# Patient Record
Sex: Female | Born: 1935 | Race: White | Hispanic: No | Marital: Married | Smoking: Former smoker
Health system: Southern US, Community
[De-identification: ages and names within clinical notes are randomized; demographics above are authoritative.]

## PROBLEM LIST (undated history)

## (undated) ENCOUNTER — Emergency Department (HOSPITAL_COMMUNITY): Discharge status: Absent without leave | Payer: Medicare Other

## (undated) DIAGNOSIS — C801 Malignant (primary) neoplasm, unspecified: Secondary | ICD-10-CM

## (undated) DIAGNOSIS — T4145XA Adverse effect of unspecified anesthetic, initial encounter: Secondary | ICD-10-CM

## (undated) DIAGNOSIS — T8859XA Other complications of anesthesia, initial encounter: Secondary | ICD-10-CM

## (undated) DIAGNOSIS — I10 Essential (primary) hypertension: Secondary | ICD-10-CM

## (undated) DIAGNOSIS — U071 COVID-19: Secondary | ICD-10-CM

## (undated) DIAGNOSIS — E079 Disorder of thyroid, unspecified: Secondary | ICD-10-CM

## (undated) DIAGNOSIS — E785 Hyperlipidemia, unspecified: Secondary | ICD-10-CM

## (undated) HISTORY — PX: KNEE SURGERY: SHX244

## (undated) HISTORY — PX: ABDOMINAL HYSTERECTOMY: SHX81

---

## 1997-10-05 ENCOUNTER — Emergency Department (HOSPITAL_COMMUNITY): Admission: EM | Admit: 1997-10-05 | Discharge: 1997-10-05 | Payer: Self-pay | Admitting: Emergency Medicine

## 1997-10-09 ENCOUNTER — Observation Stay (HOSPITAL_COMMUNITY): Admission: RE | Admit: 1997-10-09 | Discharge: 1997-10-10 | Payer: Self-pay | Admitting: Specialist

## 1998-09-07 ENCOUNTER — Ambulatory Visit (HOSPITAL_COMMUNITY): Admission: RE | Admit: 1998-09-07 | Discharge: 1998-09-07 | Payer: Self-pay | Admitting: *Deleted

## 2000-03-25 ENCOUNTER — Emergency Department (HOSPITAL_COMMUNITY): Admission: EM | Admit: 2000-03-25 | Discharge: 2000-03-25 | Payer: Self-pay | Admitting: *Deleted

## 2000-03-29 ENCOUNTER — Inpatient Hospital Stay (HOSPITAL_COMMUNITY): Admission: RE | Admit: 2000-03-29 | Discharge: 2000-03-30 | Payer: Self-pay | Admitting: Orthopedic Surgery

## 2000-03-29 ENCOUNTER — Encounter: Payer: Self-pay | Admitting: Orthopedic Surgery

## 2000-10-23 ENCOUNTER — Encounter: Admission: RE | Admit: 2000-10-23 | Discharge: 2000-10-23 | Payer: Self-pay | Admitting: Family Medicine

## 2001-05-21 ENCOUNTER — Encounter: Admission: RE | Admit: 2001-05-21 | Discharge: 2001-05-21 | Payer: Self-pay | Admitting: Family Medicine

## 2001-05-21 ENCOUNTER — Encounter: Payer: Self-pay | Admitting: Family Medicine

## 2001-11-20 ENCOUNTER — Emergency Department (HOSPITAL_COMMUNITY): Admission: EM | Admit: 2001-11-20 | Discharge: 2001-11-20 | Payer: Self-pay | Admitting: Emergency Medicine

## 2001-11-20 ENCOUNTER — Encounter: Payer: Self-pay | Admitting: Emergency Medicine

## 2002-05-09 ENCOUNTER — Encounter: Admission: RE | Admit: 2002-05-09 | Discharge: 2002-05-09 | Payer: Self-pay | Admitting: Family Medicine

## 2002-05-09 ENCOUNTER — Encounter: Payer: Self-pay | Admitting: Family Medicine

## 2002-10-07 ENCOUNTER — Inpatient Hospital Stay (HOSPITAL_COMMUNITY): Admission: RE | Admit: 2002-10-07 | Discharge: 2002-10-08 | Payer: Self-pay | Admitting: Obstetrics and Gynecology

## 2003-07-20 ENCOUNTER — Encounter: Admission: RE | Admit: 2003-07-20 | Discharge: 2003-07-20 | Payer: Self-pay | Admitting: Family Medicine

## 2004-10-12 ENCOUNTER — Ambulatory Visit (HOSPITAL_COMMUNITY): Admission: RE | Admit: 2004-10-12 | Discharge: 2004-10-12 | Payer: Self-pay | Admitting: *Deleted

## 2013-03-26 ENCOUNTER — Observation Stay (HOSPITAL_COMMUNITY): Payer: Medicare Other

## 2013-03-26 ENCOUNTER — Emergency Department (HOSPITAL_COMMUNITY): Payer: Medicare Other

## 2013-03-26 ENCOUNTER — Encounter (HOSPITAL_COMMUNITY): Payer: Self-pay | Admitting: Emergency Medicine

## 2013-03-26 ENCOUNTER — Observation Stay (HOSPITAL_COMMUNITY)
Admission: EM | Admit: 2013-03-26 | Discharge: 2013-03-28 | Disposition: A | Discharge status: Home / Self Care | Payer: Medicare Other | Attending: General Surgery | Admitting: General Surgery

## 2013-03-26 DIAGNOSIS — I700 Atherosclerosis of aorta: Secondary | ICD-10-CM | POA: Insufficient documentation

## 2013-03-26 DIAGNOSIS — E785 Hyperlipidemia, unspecified: Secondary | ICD-10-CM | POA: Insufficient documentation

## 2013-03-26 DIAGNOSIS — Z7982 Long term (current) use of aspirin: Secondary | ICD-10-CM | POA: Insufficient documentation

## 2013-03-26 DIAGNOSIS — R112 Nausea with vomiting, unspecified: Secondary | ICD-10-CM

## 2013-03-26 DIAGNOSIS — F29 Unspecified psychosis not due to a substance or known physiological condition: Secondary | ICD-10-CM | POA: Insufficient documentation

## 2013-03-26 DIAGNOSIS — I10 Essential (primary) hypertension: Secondary | ICD-10-CM | POA: Insufficient documentation

## 2013-03-26 DIAGNOSIS — K8000 Calculus of gallbladder with acute cholecystitis without obstruction: Principal | ICD-10-CM | POA: Insufficient documentation

## 2013-03-26 DIAGNOSIS — Z8589 Personal history of malignant neoplasm of other organs and systems: Secondary | ICD-10-CM | POA: Insufficient documentation

## 2013-03-26 DIAGNOSIS — R109 Unspecified abdominal pain: Secondary | ICD-10-CM

## 2013-03-26 DIAGNOSIS — K5730 Diverticulosis of large intestine without perforation or abscess without bleeding: Secondary | ICD-10-CM | POA: Insufficient documentation

## 2013-03-26 DIAGNOSIS — Q619 Cystic kidney disease, unspecified: Secondary | ICD-10-CM | POA: Insufficient documentation

## 2013-03-26 DIAGNOSIS — Z79899 Other long term (current) drug therapy: Secondary | ICD-10-CM | POA: Insufficient documentation

## 2013-03-26 DIAGNOSIS — K219 Gastro-esophageal reflux disease without esophagitis: Secondary | ICD-10-CM | POA: Insufficient documentation

## 2013-03-26 DIAGNOSIS — Z87891 Personal history of nicotine dependence: Secondary | ICD-10-CM | POA: Insufficient documentation

## 2013-03-26 DIAGNOSIS — Z9049 Acquired absence of other specified parts of digestive tract: Secondary | ICD-10-CM

## 2013-03-26 DIAGNOSIS — I498 Other specified cardiac arrhythmias: Secondary | ICD-10-CM | POA: Insufficient documentation

## 2013-03-26 DIAGNOSIS — K8020 Calculus of gallbladder without cholecystitis without obstruction: Secondary | ICD-10-CM

## 2013-03-26 DIAGNOSIS — Z90710 Acquired absence of both cervix and uterus: Secondary | ICD-10-CM | POA: Insufficient documentation

## 2013-03-26 DIAGNOSIS — E039 Hypothyroidism, unspecified: Secondary | ICD-10-CM | POA: Insufficient documentation

## 2013-03-26 HISTORY — DX: Other complications of anesthesia, initial encounter: T88.59XA

## 2013-03-26 HISTORY — DX: Adverse effect of unspecified anesthetic, initial encounter: T41.45XA

## 2013-03-26 HISTORY — DX: Hyperlipidemia, unspecified: E78.5

## 2013-03-26 HISTORY — DX: Malignant (primary) neoplasm, unspecified: C80.1

## 2013-03-26 HISTORY — DX: Essential (primary) hypertension: I10

## 2013-03-26 HISTORY — DX: Disorder of thyroid, unspecified: E07.9

## 2013-03-26 LAB — COMPREHENSIVE METABOLIC PANEL
ALT: 20 U/L (ref 0–35)
AST: 23 U/L (ref 0–37)
Albumin: 3.5 g/dL (ref 3.5–5.2)
Alkaline Phosphatase: 82 U/L (ref 39–117)
BUN: 11 mg/dL (ref 6–23)
CO2: 25 mEq/L (ref 19–32)
Calcium: 8.8 mg/dL (ref 8.4–10.5)
Chloride: 99 mEq/L (ref 96–112)
Creatinine, Ser: 0.79 mg/dL (ref 0.50–1.10)
GFR calc Af Amer: >90 mL/min (ref >90)
GFR calc non Af Amer: 78 mL/min — ABNORMAL LOW (ref >90)
Glucose, Bld: 148 mg/dL — ABNORMAL HIGH (ref 70–99)
Potassium: 3.8 mEq/L (ref 3.7–5.3)
Sodium: 137 mEq/L (ref 137–147)
Total Bilirubin: 0.6 mg/dL (ref 0.3–1.2)
Total Protein: 8.3 g/dL (ref 6.0–8.3)

## 2013-03-26 LAB — CBC WITH DIFFERENTIAL/PLATELET
Basophils Absolute: 0 10*3/uL (ref 0.0–0.1)
Basophils Relative: 0 % (ref 0–1)
Eosinophils Absolute: 0 10*3/uL (ref 0.0–0.7)
Eosinophils Relative: 0 % (ref 0–5)
HCT: 42 % (ref 36.0–46.0)
Hemoglobin: 14.6 g/dL (ref 12.0–15.0)
Lymphocytes Relative: 4 % — ABNORMAL LOW (ref 12–46)
Lymphs Abs: 0.4 10*3/uL — ABNORMAL LOW (ref 0.7–4.0)
MCH: 31 pg (ref 26.0–34.0)
MCHC: 34.8 g/dL (ref 30.0–36.0)
MCV: 89.2 fL (ref 78.0–100.0)
Monocytes Absolute: 1.2 10*3/uL — ABNORMAL HIGH (ref 0.1–1.0)
Monocytes Relative: 12 % (ref 3–12)
Neutro Abs: 8.3 10*3/uL — ABNORMAL HIGH (ref 1.7–7.7)
Neutrophils Relative %: 84 % — ABNORMAL HIGH (ref 43–77)
Platelets: 145 10*3/uL — ABNORMAL LOW (ref 150–400)
RBC: 4.71 MIL/uL (ref 3.87–5.11)
RDW: 12.7 % (ref 11.5–15.5)
WBC: 9.8 10*3/uL (ref 4.0–10.5)

## 2013-03-26 LAB — LIPASE, BLOOD: Lipase: 32 U/L (ref 11–59)

## 2013-03-26 LAB — URINALYSIS, ROUTINE W REFLEX MICROSCOPIC
Bilirubin Urine: NEGATIVE
Glucose, UA: NEGATIVE mg/dL
Ketones, ur: NEGATIVE mg/dL
Leukocytes, UA: NEGATIVE
Nitrite: NEGATIVE
Protein, ur: 30 mg/dL — AB
Specific Gravity, Urine: 1.041 — ABNORMAL HIGH (ref 1.005–1.030)
Urobilinogen, UA: 1 mg/dL (ref 0.0–1.0)
pH: 6.5 (ref 5.0–8.0)

## 2013-03-26 LAB — APTT: APTT: 33 s (ref 24–37)

## 2013-03-26 LAB — URINE MICROSCOPIC-ADD ON

## 2013-03-26 MED ORDER — DIPHENHYDRAMINE HCL 12.5 MG/5ML PO ELIX: 12.5000 mg | ORAL_SOLUTION | Freq: Four times a day (QID) | ORAL | Status: DC | PRN

## 2013-03-26 MED ORDER — ATENOLOL 12.5 MG HALF TABLET
12.5000 mg | ORAL_TABLET | Freq: Every day | ORAL | Status: DC
  Administered 2013-03-28: 12.5 mg via ORAL
  Filled 2013-03-26 (×2): qty 1

## 2013-03-26 MED ORDER — HYDROMORPHONE HCL PF 1 MG/ML IJ SOLN
0.5000 mg | INTRAMUSCULAR | Status: DC | PRN
  Administered 2013-03-26: 0.5 mg via INTRAVENOUS
  Filled 2013-03-26: qty 1

## 2013-03-26 MED ORDER — ONDANSETRON HCL 4 MG/2ML IJ SOLN: 4.0000 mg | Freq: Four times a day (QID) | INTRAMUSCULAR | Status: DC | PRN

## 2013-03-26 MED ORDER — SODIUM CHLORIDE 0.9 % IV SOLN
3.0000 g | Freq: Four times a day (QID) | INTRAVENOUS | Status: DC
  Administered 2013-03-26 – 2013-03-28 (×6): 3 g via INTRAVENOUS
  Filled 2013-03-26 (×9): qty 3

## 2013-03-26 MED ORDER — LEVOTHYROXINE SODIUM 75 MCG PO TABS
75.0000 ug | ORAL_TABLET | Freq: Every day | ORAL | Status: DC
  Administered 2013-03-27 – 2013-03-28 (×2): 75 ug via ORAL
  Filled 2013-03-26 (×3): qty 1

## 2013-03-26 MED ORDER — DIPHENHYDRAMINE HCL 50 MG/ML IJ SOLN: 12.5000 mg | Freq: Four times a day (QID) | INTRAMUSCULAR | Status: DC | PRN

## 2013-03-26 MED ORDER — ACETAMINOPHEN 650 MG RE SUPP: 650.0000 mg | Freq: Four times a day (QID) | RECTAL | Status: DC | PRN

## 2013-03-26 MED ORDER — HEPARIN SODIUM (PORCINE) 5000 UNIT/ML IJ SOLN
5000.0000 [IU] | Freq: Once | INTRAMUSCULAR | Status: AC
  Administered 2013-03-26: 5000 [IU] via SUBCUTANEOUS
  Filled 2013-03-26: qty 1

## 2013-03-26 MED ORDER — IOHEXOL 300 MG/ML  SOLN
100.0000 mL | Freq: Once | INTRAMUSCULAR | Status: AC | PRN
  Administered 2013-03-26: 100 mL via INTRAVENOUS

## 2013-03-26 MED ORDER — ACETAMINOPHEN 325 MG PO TABS
650.0000 mg | ORAL_TABLET | Freq: Four times a day (QID) | ORAL | Status: DC | PRN
  Administered 2013-03-27: 650 mg via ORAL
  Filled 2013-03-26: qty 2

## 2013-03-26 MED ORDER — OXYCODONE HCL 5 MG PO TABS
5.0000 mg | ORAL_TABLET | ORAL | Status: DC | PRN
  Administered 2013-03-26 – 2013-03-27 (×2): 10 mg via ORAL
  Filled 2013-03-26 (×2): qty 2

## 2013-03-26 MED ORDER — ONDANSETRON HCL 4 MG/2ML IJ SOLN
4.0000 mg | Freq: Once | INTRAMUSCULAR | Status: AC
  Administered 2013-03-26: 4 mg via INTRAVENOUS
  Filled 2013-03-26: qty 2

## 2013-03-26 MED ORDER — MORPHINE SULFATE 4 MG/ML IJ SOLN
4.0000 mg | Freq: Once | INTRAMUSCULAR | Status: AC
  Administered 2013-03-26: 4 mg via INTRAVENOUS
  Filled 2013-03-26: qty 1

## 2013-03-26 MED ORDER — POTASSIUM CHLORIDE IN NACL 20-0.9 MEQ/L-% IV SOLN
INTRAVENOUS | Status: DC
  Administered 2013-03-26: 75 mL via INTRAVENOUS
  Filled 2013-03-26 (×2): qty 1000

## 2013-03-26 MED ORDER — IOHEXOL 300 MG/ML  SOLN
50.0000 mL | Freq: Once | INTRAMUSCULAR | Status: AC | PRN
  Administered 2013-03-26: 50 mL via ORAL

## 2013-03-26 MED ORDER — SODIUM CHLORIDE 0.9 % IV BOLUS (SEPSIS)
1000.0000 mL | Freq: Once | INTRAVENOUS | Status: AC
  Administered 2013-03-26: 1000 mL via INTRAVENOUS

## 2013-03-26 NOTE — ED Notes (Signed)
Dilaudid 0.5mg  IVP given at transfer

## 2013-03-26 NOTE — ED Notes (Signed)
Patient transported to CT 

## 2013-03-26 NOTE — H&P (Signed)
Amber [name] is an 80 y.o. female.   [name], MD PCP Chief Complaint: Abdominal pain and nausea 2 nights in a row. HPI: 80 y/o female in fairly good health had abdominal pain Monday PM, 1st episode, nothing like this before, different from GERD, in Mid abdominal region. Pain got better Monday PM, 03/24/13.  She did well went to a [employer] supper at church 2/10, and pain reoccurred that afternoon from 4PM till she got treated here in the ER 03/26/13.  She is currently pain free, she has had GERD, but this is clearly different. Pain is again mid epigastric area.  Some nausea, but no vomiting, some dry heaves.   IN ER CT scan shows: There are multiple calcified and noncalcified gallstones.  Gallbladder wall appears mildly thickened with mild surrounding inflammatory change. Gallbladder is distended to about 10 cm. CBC, Lipase, and CMP are normal.  CT also shows a renal cyst and calcified aorta. She is currently pain free.  She has not eaten today.  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 She has fallen multiple times with this and has had surgery after falls.    Past Surgical History  Procedure Laterality Date   Multiple surgeries Left leg, including large resection of sarcoma.    . Abdominal hysterectomy      No family history on file. Social History:  reports that she has quit smoking. She does not have any smokeless tobacco history on file. She reports that she does not drink alcohol or use illicit drugs. Tobacco:  2PPD 34 years  Quit age 51 Drugs:  None ETOH:  None Retired from [employer] Allergies: No Known Allergies  Prior to Admission medications   Medication Sig Start Date End Date Taking? Authorizing Provider   aspirin 81 MG tablet Take 81 mg by mouth daily.   Yes Historical Provider, MD  atenolol (TENORMIN) 25 MG tablet Take 12.5 mg by mouth daily.   Yes Historical Provider, MD  levothyroxine (SYNTHROID, LEVOTHROID) 75 MCG tablet Take 75 mcg by mouth daily before breakfast.   Yes Historical Provider, MD  Multiple Vitamins-Calcium (VIACTIV MULTI-VITAMIN) CHEW Chew 1 tablet by mouth every evening.   Yes Historical Provider, MD  pravastatin (PRAVACHOL) 20 MG tablet Take 20 mg by mouth at bedtime.   Yes Historical Provider, MD     Results for orders placed during the hospital encounter of 03/26/13 (from the past 48 hour(s))  COMPREHENSIVE METABOLIC PANEL     Status: Abnormal   Collection Time    03/26/13  1:45 PM      Result Value Ref Range   Sodium 137  137 - 147 mEq/L   Potassium 3.8  3.7 - 5.3 mEq/L   Chloride 99  96 - 112 mEq/L   CO2 25  19 - 32 mEq/L   Glucose, Bld 148 (*) 70 - 99 mg/dL   BUN 11  6 - 23 mg/dL   Creatinine, Ser 0.79  0.50 - 1.10 mg/dL   Calcium 8.8  8.4 - 10.5 mg/dL   Total Protein 8.3  6.0 - 8.3 g/dL   Albumin 3.5  3.5 - 5.2 g/dL   AST 23  0 - 37 U/L  ALT 20  0 - 35 U/L   Alkaline Phosphatase 82  39 - 117 U/L   Total Bilirubin 0.6  0.3 - 1.2 mg/dL   GFR calc non Af Amer 78 (*) >90 mL/min   GFR calc Af Amer >90  >90 mL/min   Comment: (NOTE)     The eGFR has been calculated using the CKD EPI equation.     This calculation has not been validated in all clinical situations.     eGFR's persistently <90 mL/min signify possible Chronic Kidney     Disease.  LIPASE, BLOOD     Status: None   Collection Time    03/26/13  1:45 PM      Result Value Ref Range   Lipase 32  11 - 59 U/L  CBC WITH DIFFERENTIAL     Status: Abnormal   Collection Time    03/26/13  1:45 PM      Result Value Ref Range   WBC 9.8  4.0 - 10.5 K/uL   RBC 4.71  3.87 - 5.11 MIL/uL   Hemoglobin 14.6  12.0 - 15.0 g/dL   HCT 42.0  36.0 - 46.0 %   MCV 89.2  78.0 - 100.0 fL   MCH 31.0  26.0 - 34.0 pg    MCHC 34.8  30.0 - 36.0 g/dL   RDW 12.7  11.5 - 15.5 %   Platelets 145 (*) 150 - 400 K/uL   Neutrophils Relative % 84 (*) 43 - 77 %   Neutro Abs 8.3 (*) 1.7 - 7.7 K/uL   Lymphocytes Relative 4 (*) 12 - 46 %   Lymphs Abs 0.4 (*) 0.7 - 4.0 K/uL   Monocytes Relative 12  3 - 12 %   Monocytes Absolute 1.2 (*) 0.1 - 1.0 K/uL   Eosinophils Relative 0  0 - 5 %   Eosinophils Absolute 0.0  0.0 - 0.7 K/uL   Basophils Relative 0  0 - 1 %   Basophils Absolute 0.0  0.0 - 0.1 K/uL  URINALYSIS, ROUTINE W REFLEX MICROSCOPIC     Status: Abnormal   Collection Time    03/26/13  3:37 PM      Result Value Ref Range   Color, Urine YELLOW  YELLOW   APPearance CLEAR  CLEAR   Specific Gravity, Urine 1.041 (*) 1.005 - 1.030   pH 6.5  5.0 - 8.0   Glucose, UA NEGATIVE  NEGATIVE mg/dL   Hgb urine dipstick TRACE (*) NEGATIVE   Bilirubin Urine NEGATIVE  NEGATIVE   Ketones, ur NEGATIVE  NEGATIVE mg/dL   Protein, ur 30 (*) NEGATIVE mg/dL   Urobilinogen, UA 1.0  0.0 - 1.0 mg/dL   Nitrite NEGATIVE  NEGATIVE   Leukocytes, UA NEGATIVE  NEGATIVE  URINE MICROSCOPIC-ADD ON     Status: None   Collection Time    03/26/13  3:37 PM      Result Value Ref Range   Squamous Epithelial / LPF RARE  RARE   WBC, UA 0-2  <3 WBC/hpf   RBC / HPF 0-2  <3 RBC/hpf   Ct Abdomen Pelvis W Contrast  03/26/2013   CLINICAL DATA:  Upper abdominal pain for 2 days with nausea and vomiting, history of left leg sarcoma  EXAM: CT ABDOMEN AND PELVIS WITH CONTRAST  TECHNIQUE: Multidetector CT imaging of the abdomen and pelvis was performed using the standard protocol following bolus administration of intravenous contrast.  CONTRAST:  149m OMNIPAQUE IOHEXOL 300 MG/ML  SOLN  COMPARISON:  None.  FINDINGS: Visualized lung bases clear. Bone windows negative for acute findings.  Liver, spleen, pancreas, and adrenal glands are normal. Right kidney is normal.  Aorta is calcified. There is diverticulosis of the sigmoid and descending colon. Bowel is otherwise  normal. Appendix is normal.  Bladder is normal. Reproductive organs appear to be absent. No ascites or significant adenopathy.  There are multiple calcified and noncalcified gallstones. Gallbladder wall appears mildly thickened with mild surrounding inflammatory change. Gallbladder is distended to about 10 cm.  In the midpole the left kidney, there is a low-attenuation circumscribed lesion measuring 14 x 10 mm. It demonstrates an average attenuation value of 49. There appears to be a mildly hyper attenuating nodule within this lesion.  IMPRESSION: 1. Findings are concerning for cholecystitis associated with cholelithiasis. 2. Indeterminate 14 mm left renal lesion. Complex cyst versus malignancy both possible. Recommend nonemergent renal protocol MRI.   Electronically Signed   By: Skipper Cliche M.D.   On: 03/26/2013 15:00    Review of Systems  Constitutional: Negative for fever, chills, weight loss, malaise/fatigue and diaphoresis.  HENT: Negative.   Eyes: Negative.   Respiratory: Negative.   Cardiovascular: Negative.   Gastrointestinal: Positive for heartburn (for years), nausea, vomiting (more like dry heaves, yellow spit, no real vomiting.) and constipation. Negative for diarrhea, blood in stool and melena.  Genitourinary: Negative.   Musculoskeletal:       Left leg pain after extensive resection of sarcoma.  Skin: Negative.   Neurological: Negative.  Negative for weakness.  Endo/Heme/Allergies: Bruises/bleeds easily.  Psychiatric/Behavioral: Negative.     Blood pressure 198/88, pulse 79, temperature 98.3 F (36.8 C), resp. rate 16, height 5' 8.75" (1.746 m), weight 69.854 kg (154 lb), SpO2 93.00%. Physical Exam  Constitutional: She is oriented to person, place, and time. She appears well-developed and well-nourished. No distress.  HENT:  Head: Normocephalic and atraumatic.  Nose: Nose normal.  Eyes: Conjunctivae and EOM are normal. Pupils are equal, round, and reactive to light. Right  eye exhibits no discharge. Left eye exhibits no discharge. No scleral icterus.  Neck: Normal range of motion. Neck supple. No JVD present. No tracheal deviation present. No thyromegaly present.  Cardiovascular: Normal rate, regular rhythm, normal heart sounds and intact distal pulses.  Exam reveals no gallop.   No murmur heard. Respiratory: Effort normal and breath sounds normal. No respiratory distress. She has no wheezes. She has no rales. She exhibits no tenderness.  GI: Soft. Bowel sounds are normal. She exhibits no distension and no mass. There is no tenderness. There is no rebound and no guarding.  Large midline surgical scar, s/p hysterectomy  Musculoskeletal: She exhibits no edema and no tenderness.  Lymphadenopathy:    She has no cervical adenopathy.  Neurological: She is alert and oriented to person, place, and time. No cranial nerve deficit.  Skin: Skin is warm and dry. No rash noted. She is not diaphoretic. No erythema. No pallor.  Psychiatric: She has a normal mood and affect. Her behavior is normal. Judgment and thought content normal.     Assessment/Plan 1.  Cholecystitis/cholelithiasis 2.  Hx of left leg sarcoma with resection 3.  Hypertension 4.  Hyperlipidemia 5.  Hypothyroid, on supplement. 6.  GERD 7.  Former smoker (60 pack year hx) 8.  Left renal cyst found on CT scan  Plan:  Admit, IV antibiotics, clear liquids till MN, hydrate and surgery tomorrow.  We will defer renal cyst to PCP.  Norinne Jeane 03/26/2013, 5:17 PM

## 2013-03-26 NOTE — ED Notes (Signed)
MEDS TAKEN TO ROOM

## 2013-03-26 NOTE — ED Notes (Signed)
Pt states can not void at this time

## 2013-03-26 NOTE — ED Notes (Signed)
inpt RN aware that fluids/abx not yet received from pharmacy.

## 2013-03-26 NOTE — ED Notes (Signed)
Pt presents with NAD- Per [facility]- pt c/o of stomach pain x 2 days- vomiting bile like emesis. zofran 4mg  given IVP in route. Denies fever and diarrhea.

## 2013-03-26 NOTE — ED Provider Notes (Signed)
CSN: [id number]     Arrival date & time 03/26/13  1138 History   First MD Initiated Contact with Patient 03/26/13 1156     Chief Complaint  Patient presents with  . Abdominal Pain  . Nausea     (Consider location/radiation/quality/duration/timing/severity/associated sxs/prior Treatment) HPI  80 year old female with abdominal pain. Epigastric/right upper quadrant. Intermittent over the past 2 days. Last night becoming much more severe and did not subside. Associated with nausea and vomiting. No fevers or chills. No urinary complaints. No diarrhea. No past history of similar symptoms.  Surgical history is significant for hysterectomy. No recent procedures.  Past Medical History  Diagnosis Date  . Hypertension   . Hyperlipidemia   . Thyroid disease   . sarcoma of lt leg dx'd 1989    xrt and chemo and surg   Past Surgical History  Procedure Laterality Date  . Abdominal hysterectomy     No family history on file. History  Substance Use Topics  . Smoking status: Former Research scientist (life sciences)  . Smokeless tobacco: Not on file  . Alcohol Use: No   OB History   Grav Para Term Preterm Abortions TAB SAB Ect Mult Living                 Review of Systems  All systems reviewed and negative, other than as noted in HPI.   Allergies  Review of patient's allergies indicates no known allergies.  Home Medications   Current Outpatient Rx  Name  Route  Sig  Dispense  Refill  . aspirin 81 MG tablet   Oral   Take 81 mg by mouth daily.         Marland Kitchen atenolol (TENORMIN) 25 MG tablet   Oral   Take 12.5 mg by mouth daily.         Marland Kitchen levothyroxine (SYNTHROID, LEVOTHROID) 75 MCG tablet   Oral   Take 75 mcg by mouth daily before breakfast.         . Multiple Vitamins-Calcium (VIACTIV MULTI-VITAMIN) CHEW   Oral   Chew 1 tablet by mouth every evening.         . pravastatin (PRAVACHOL) 20 MG tablet   Oral   Take 20 mg by mouth at bedtime.          BP 196/74  Pulse 65  Temp(Src) 98.3 F  (36.8 C)  Resp 16  Ht 5' 8.75" (1.746 m)  Wt 154 lb (69.854 kg)  BMI 22.91 kg/m2  SpO2 95% Physical Exam  Nursing note and vitals reviewed. Constitutional: She appears well-developed and well-nourished. No distress.  HENT:  Head: Normocephalic and atraumatic.  Eyes: Conjunctivae are normal. Right eye exhibits no discharge. Left eye exhibits no discharge.  Neck: Neck supple.  Cardiovascular: Normal rate, regular rhythm and normal heart sounds.  Exam reveals no gallop and no friction rub.   No murmur heard. Pulmonary/Chest: Effort normal and breath sounds normal. No respiratory distress.  Abdominal: Soft. She exhibits no distension. There is tenderness. There is no rebound and no guarding.  Tenderness in epigastrium and RUQ. No rebound or guarding. No distension.    Musculoskeletal: She exhibits no edema and no tenderness.  Neurological: She is alert.  Skin: Skin is warm and dry.  Psychiatric: She has a normal mood and affect. Her behavior is normal. Thought content normal.    ED Course  Procedures (including critical care time) Labs Review Labs Reviewed  COMPREHENSIVE METABOLIC PANEL - Abnormal; Notable for the following:  Glucose, Bld 148 (*)    GFR calc non Af Amer 78 (*)    All other components within normal limits  CBC WITH DIFFERENTIAL - Abnormal; Notable for the following:    Platelets 145 (*)    Neutrophils Relative % 84 (*)    Neutro Abs 8.3 (*)    Lymphocytes Relative 4 (*)    Lymphs Abs 0.4 (*)    Monocytes Absolute 1.2 (*)    All other components within normal limits  LIPASE, BLOOD  URINALYSIS, ROUTINE W REFLEX MICROSCOPIC   Imaging Review Ct Abdomen Pelvis W Contrast  03/26/2013   CLINICAL DATA:  Upper abdominal pain for 2 days with nausea and vomiting, history of left leg sarcoma  EXAM: CT ABDOMEN AND PELVIS WITH CONTRAST  TECHNIQUE: Multidetector CT imaging of the abdomen and pelvis was performed using the standard protocol following bolus administration  of intravenous contrast.  CONTRAST:  160mL OMNIPAQUE IOHEXOL 300 MG/ML  SOLN  COMPARISON:  None.  FINDINGS: Visualized lung bases clear. Bone windows negative for acute findings.  Liver, spleen, pancreas, and adrenal glands are normal. Right kidney is normal.  Aorta is calcified. There is diverticulosis of the sigmoid and descending colon. Bowel is otherwise normal. Appendix is normal.  Bladder is normal. Reproductive organs appear to be absent. No ascites or significant adenopathy.  There are multiple calcified and noncalcified gallstones. Gallbladder wall appears mildly thickened with mild surrounding inflammatory change. Gallbladder is distended to about 10 cm.  In the midpole the left kidney, there is a low-attenuation circumscribed lesion measuring 14 x 10 mm. It demonstrates an average attenuation value of 49. There appears to be a mildly hyper attenuating nodule within this lesion.  IMPRESSION: 1. Findings are concerning for cholecystitis associated with cholelithiasis. 2. Indeterminate 14 mm left renal lesion. Complex cyst versus malignancy both possible. Recommend nonemergent renal protocol MRI.   Electronically Signed   By: Skipper Cliche M.D.   On: 03/26/2013 15:00    EKG Interpretation   None       MDM   Final diagnoses:  Abdominal pain  Nausea and vomiting  Cholelithiasis    80yF with intermittent epigastric pain and n/v. CT significant for cholelithiasis. Additional findings concerning for possible cholecystitis. Pt reports complete resolution of symptoms after a single dose of pain meds. Afebrile. No leukocytosis. Normal LFTs. I can no longer illicit any tenderness on exam. I would expect an acutely inflamed GB to remain tender to palpation even with pain medication. CT findings and age concerning though and surgery consulted.     Virgel Manifold, MD 03/26/13 1700

## 2013-03-26 NOTE — ED Notes (Signed)
Pt ret from CT at this time, family remains at bs, pt continues to deny needs/complaints at this time.  NAD.

## 2013-03-26 NOTE — ED Notes (Signed)
Bed: PQ33 Expected date:  Expected time:  Means of arrival:  Comments: ABD PAIN

## 2013-03-26 NOTE — ED Notes (Signed)
Initial contact - pt resting on stretcher with family at bs.  Pt reports upper abd pain x2 days, +nausea, reports vomiting "bile".  No active vomiting at this time.  Pt reports hx constipation, denies other abd hx.  Abd s/nt/nd.  +bsx4 quads.  Pt denies CP/SOB or other complaints.  Skin PWD, MAEI.  Speaking full/clear sentences, rr even/un-lab.  NAD.

## 2013-03-27 ENCOUNTER — Encounter (HOSPITAL_COMMUNITY)
Admission: EM | Disposition: A | Discharge status: Home / Self Care | Payer: Self-pay | Source: Home / Self Care | Attending: Emergency Medicine

## 2013-03-27 ENCOUNTER — Encounter (HOSPITAL_COMMUNITY): Payer: Self-pay | Admitting: Certified Registered Nurse Anesthetist

## 2013-03-27 ENCOUNTER — Encounter (HOSPITAL_COMMUNITY): Payer: Medicare Other | Admitting: Certified Registered Nurse Anesthetist

## 2013-03-27 ENCOUNTER — Observation Stay (HOSPITAL_COMMUNITY): Payer: Medicare Other | Admitting: Certified Registered Nurse Anesthetist

## 2013-03-27 ENCOUNTER — Observation Stay (HOSPITAL_COMMUNITY): Payer: Medicare Other

## 2013-03-27 DIAGNOSIS — Z9049 Acquired absence of other specified parts of digestive tract: Secondary | ICD-10-CM

## 2013-03-27 HISTORY — PX: CHOLECYSTECTOMY: SHX55

## 2013-03-27 LAB — CBC
HCT: 39.1 % (ref 36.0–46.0)
HEMATOCRIT: 36.7 % (ref 36.0–46.0)
HEMOGLOBIN: 13.4 g/dL (ref 12.0–15.0)
Hemoglobin: 12.6 g/dL (ref 12.0–15.0)
MCH: 30.8 pg (ref 26.0–34.0)
MCH: 30.9 pg (ref 26.0–34.0)
MCHC: 34.3 g/dL (ref 30.0–36.0)
MCHC: 34.3 g/dL (ref 30.0–36.0)
MCV: 89.9 fL (ref 78.0–100.0)
MCV: 90 fL (ref 78.0–100.0)
PLATELETS: 147 10*3/uL — AB (ref 150–400)
PLATELETS: 118 10*3/uL — AB (ref 150–400)
RBC: 4.35 MIL/uL (ref 3.87–5.11)
RBC: 4.08 MIL/uL (ref 3.87–5.11)
RDW: 12.9 % (ref 11.5–15.5)
RDW: 13 % (ref 11.5–15.5)
WBC: 11.9 10*3/uL — AB (ref 4.0–10.5)
WBC: 10 10*3/uL (ref 4.0–10.5)

## 2013-03-27 LAB — COMPREHENSIVE METABOLIC PANEL
ALBUMIN: 2.8 g/dL — AB (ref 3.5–5.2)
ALT: 17 U/L (ref 0–35)
AST: 19 U/L (ref 0–37)
Alkaline Phosphatase: 69 U/L (ref 39–117)
BILIRUBIN TOTAL: 0.8 mg/dL (ref 0.3–1.2)
BUN: 12 mg/dL (ref 6–23)
CHLORIDE: 103 meq/L (ref 96–112)
CO2: 25 mEq/L (ref 19–32)
CREATININE: 0.76 mg/dL (ref 0.50–1.10)
Calcium: 8.2 mg/dL — ABNORMAL LOW (ref 8.4–10.5)
GFR calc Af Amer: >90 mL/min (ref >90)
GFR calc non Af Amer: 79 mL/min — ABNORMAL LOW (ref >90)
Glucose, Bld: 129 mg/dL — ABNORMAL HIGH (ref 70–99)
Potassium: 3.5 mEq/L — ABNORMAL LOW (ref 3.7–5.3)
Sodium: 139 mEq/L (ref 137–147)
Total Protein: 7 g/dL (ref 6.0–8.3)

## 2013-03-27 LAB — PROTIME-INR
INR: 1.34 (ref 0.00–1.49)
Prothrombin Time: 16.3 seconds — ABNORMAL HIGH (ref 11.6–15.2)

## 2013-03-27 LAB — SURGICAL PCR SCREEN
MRSA, PCR: NEGATIVE
Staphylococcus aureus: NEGATIVE

## 2013-03-27 LAB — LIPASE, BLOOD: LIPASE: 20 U/L (ref 11–59)

## 2013-03-27 LAB — CREATININE, SERUM
Creatinine, Ser: 1.07 mg/dL (ref 0.50–1.10)
GFR calc non Af Amer: 48 mL/min — ABNORMAL LOW (ref >90)
GFR, EST AFRICAN AMERICAN: 56 mL/min — AB (ref >90)

## 2013-03-27 SURGERY — LAPAROSCOPIC CHOLECYSTECTOMY WITH INTRAOPERATIVE CHOLANGIOGRAM
Anesthesia: General | Site: Abdomen

## 2013-03-27 MED ORDER — ONDANSETRON HCL 4 MG/2ML IJ SOLN
INTRAMUSCULAR | Status: DC | PRN
  Administered 2013-03-27: 4 mg via INTRAVENOUS

## 2013-03-27 MED ORDER — HYDROMORPHONE HCL PF 1 MG/ML IJ SOLN: 0.5000 mg | INTRAMUSCULAR | Status: DC | PRN

## 2013-03-27 MED ORDER — LACTATED RINGERS IV SOLN
INTRAVENOUS | Status: DC | PRN
  Administered 2013-03-27 (×2): via INTRAVENOUS

## 2013-03-27 MED ORDER — ROCURONIUM BROMIDE 100 MG/10ML IV SOLN
INTRAVENOUS | Status: DC | PRN
  Administered 2013-03-27: 30 mg via INTRAVENOUS

## 2013-03-27 MED ORDER — POTASSIUM CHLORIDE IN NACL 40-0.9 MEQ/L-% IV SOLN
INTRAVENOUS | Status: DC
  Administered 2013-03-27 (×2): via INTRAVENOUS
  Filled 2013-03-27 (×3): qty 1000

## 2013-03-27 MED ORDER — METOPROLOL TARTRATE 1 MG/ML IV SOLN
2.5000 mg | Freq: Once | INTRAVENOUS | Status: DC
  Filled 2013-03-27: qty 5

## 2013-03-27 MED ORDER — FENTANYL CITRATE 0.05 MG/ML IJ SOLN
INTRAMUSCULAR | Status: DC | PRN
  Administered 2013-03-27 (×4): 50 ug via INTRAVENOUS

## 2013-03-27 MED ORDER — ACETAMINOPHEN 325 MG PO TABS: 650.0000 mg | ORAL_TABLET | ORAL | Status: DC | PRN

## 2013-03-27 MED ORDER — DEXAMETHASONE SODIUM PHOSPHATE 10 MG/ML IJ SOLN
INTRAMUSCULAR | Status: AC
  Filled 2013-03-27: qty 1

## 2013-03-27 MED ORDER — LIDOCAINE HCL (CARDIAC) 20 MG/ML IV SOLN
INTRAVENOUS | Status: AC
  Filled 2013-03-27: qty 5

## 2013-03-27 MED ORDER — SUCCINYLCHOLINE CHLORIDE 20 MG/ML IJ SOLN
INTRAMUSCULAR | Status: AC
  Filled 2013-03-27: qty 1

## 2013-03-27 MED ORDER — SODIUM CHLORIDE 0.9 % IV SOLN
3.0000 g | Freq: Once | INTRAVENOUS | Status: AC
  Administered 2013-03-27: 3 g via INTRAVENOUS
  Filled 2013-03-27: qty 3

## 2013-03-27 MED ORDER — ONDANSETRON HCL 4 MG/2ML IJ SOLN
INTRAMUSCULAR | Status: AC
  Filled 2013-03-27: qty 2

## 2013-03-27 MED ORDER — ROCURONIUM BROMIDE 100 MG/10ML IV SOLN
INTRAVENOUS | Status: AC
  Filled 2013-03-27: qty 1

## 2013-03-27 MED ORDER — PHENYLEPHRINE HCL 10 MG/ML IJ SOLN
INTRAMUSCULAR | Status: DC | PRN
  Administered 2013-03-27 (×3): 80 ug via INTRAVENOUS

## 2013-03-27 MED ORDER — HYDROMORPHONE HCL PF 1 MG/ML IJ SOLN: 0.2500 mg | INTRAMUSCULAR | Status: DC | PRN

## 2013-03-27 MED ORDER — NEOSTIGMINE METHYLSULFATE 1 MG/ML IJ SOLN
INTRAMUSCULAR | Status: AC
  Filled 2013-03-27: qty 10

## 2013-03-27 MED ORDER — HEPARIN SODIUM (PORCINE) 5000 UNIT/ML IJ SOLN
5000.0000 [IU] | Freq: Three times a day (TID) | INTRAMUSCULAR | Status: DC
  Administered 2013-03-27 – 2013-03-28 (×2): 5000 [IU] via SUBCUTANEOUS
  Filled 2013-03-27 (×5): qty 1

## 2013-03-27 MED ORDER — PROPOFOL 10 MG/ML IV BOLUS
INTRAVENOUS | Status: DC | PRN
  Administered 2013-03-27: 130 mg via INTRAVENOUS

## 2013-03-27 MED ORDER — EPHEDRINE SULFATE 50 MG/ML IJ SOLN
INTRAMUSCULAR | Status: DC | PRN
  Administered 2013-03-27: 5 mg via INTRAVENOUS
  Administered 2013-03-27: 10 mg via INTRAVENOUS

## 2013-03-27 MED ORDER — GLYCOPYRROLATE 0.2 MG/ML IJ SOLN
INTRAMUSCULAR | Status: DC | PRN
  Administered 2013-03-27: 0.6 mg via INTRAVENOUS

## 2013-03-27 MED ORDER — PROPOFOL 10 MG/ML IV BOLUS
INTRAVENOUS | Status: AC
  Filled 2013-03-27: qty 20

## 2013-03-27 MED ORDER — BUPIVACAINE-EPINEPHRINE PF 0.25-1:200000 % IJ SOLN
INTRAMUSCULAR | Status: AC
  Filled 2013-03-27: qty 30

## 2013-03-27 MED ORDER — GLYCOPYRROLATE 0.2 MG/ML IJ SOLN
INTRAMUSCULAR | Status: AC
  Filled 2013-03-27: qty 3

## 2013-03-27 MED ORDER — SUCCINYLCHOLINE CHLORIDE 20 MG/ML IJ SOLN
INTRAMUSCULAR | Status: DC | PRN
  Administered 2013-03-27: 100 mg via INTRAVENOUS

## 2013-03-27 MED ORDER — IOHEXOL 300 MG/ML  SOLN
INTRAMUSCULAR | Status: DC | PRN
  Administered 2013-03-27: 3 mL

## 2013-03-27 MED ORDER — SODIUM CHLORIDE 0.9 % IV BOLUS (SEPSIS)
250.0000 mL | Freq: Once | INTRAVENOUS | Status: AC
  Administered 2013-03-27: 250 mL via INTRAVENOUS

## 2013-03-27 MED ORDER — LACTATED RINGERS IV SOLN: INTRAVENOUS | Status: DC

## 2013-03-27 MED ORDER — BUPIVACAINE LIPOSOME 1.3 % IJ SUSP
20.0000 mL | Freq: Once | INTRAMUSCULAR | Status: DC
  Filled 2013-03-27: qty 20

## 2013-03-27 MED ORDER — FENTANYL CITRATE 0.05 MG/ML IJ SOLN
INTRAMUSCULAR | Status: AC
  Filled 2013-03-27: qty 5

## 2013-03-27 MED ORDER — KCL IN DEXTROSE-NACL 20-5-0.45 MEQ/L-%-% IV SOLN
INTRAVENOUS | Status: DC
  Administered 2013-03-27: 17:00:00 via INTRAVENOUS
  Filled 2013-03-27 (×3): qty 1000

## 2013-03-27 MED ORDER — BUPIVACAINE-EPINEPHRINE 0.25% -1:200000 IJ SOLN
INTRAMUSCULAR | Status: DC | PRN
  Administered 2013-03-27: 20 mL

## 2013-03-27 MED ORDER — MIDAZOLAM HCL 2 MG/2ML IJ SOLN
INTRAMUSCULAR | Status: AC
  Filled 2013-03-27: qty 2

## 2013-03-27 MED ORDER — NEOSTIGMINE METHYLSULFATE 1 MG/ML IJ SOLN
INTRAMUSCULAR | Status: DC | PRN
  Administered 2013-03-27: 5 mg via INTRAVENOUS

## 2013-03-27 MED ORDER — OXYCODONE-ACETAMINOPHEN 5-325 MG PO TABS: 1.0000 | ORAL_TABLET | ORAL | Status: DC | PRN

## 2013-03-27 MED ORDER — LIDOCAINE HCL (CARDIAC) 20 MG/ML IV SOLN
INTRAVENOUS | Status: DC | PRN
  Administered 2013-03-27: 100 mg via INTRAVENOUS

## 2013-03-27 MED ORDER — DEXAMETHASONE SODIUM PHOSPHATE 10 MG/ML IJ SOLN
INTRAMUSCULAR | Status: DC | PRN
  Administered 2013-03-27: 10 mg via INTRAVENOUS

## 2013-03-27 SURGICAL SUPPLY — 36 items
APPLIER CLIP ROT 10 11.4 M/L (STAPLE) ×3
BENZOIN TINCTURE PRP APPL 2/3 (GAUZE/BANDAGES/DRESSINGS) IMPLANT
CABLE HIGH FREQUENCY MONO STRZ (ELECTRODE) IMPLANT
CANISTER SUCTION 2500CC (MISCELLANEOUS) ×3 IMPLANT
CATH REDDICK CHOLANGI 4FR 50CM (CATHETERS) ×3 IMPLANT
CLIP APPLIE ROT 10 11.4 M/L (STAPLE) ×1 IMPLANT
CLOSURE WOUND 1/2 X4 (GAUZE/BANDAGES/DRESSINGS)
COVER MAYO STAND STRL (DRAPES) ×3 IMPLANT
DECANTER SPIKE VIAL GLASS SM (MISCELLANEOUS) ×3 IMPLANT
DERMABOND ADVANCED (GAUZE/BANDAGES/DRESSINGS)
DERMABOND ADVANCED .7 DNX12 (GAUZE/BANDAGES/DRESSINGS) IMPLANT
DRAPE C-ARM 42X120 X-RAY (DRAPES) ×3 IMPLANT
DRAPE LAPAROSCOPIC ABDOMINAL (DRAPES) ×3 IMPLANT
ELECT REM PT RETURN 9FT ADLT (ELECTROSURGICAL) ×3
ELECTRODE REM PT RTRN 9FT ADLT (ELECTROSURGICAL) ×1 IMPLANT
GLOVE BIOGEL M 8.0 STRL (GLOVE) ×3 IMPLANT
GOWN STRL REUS W/TWL XL LVL3 (GOWN DISPOSABLE) ×6 IMPLANT
HEMOSTAT SURGICEL 4X8 (HEMOSTASIS) IMPLANT
IV CATH 14GX2 1/4 (CATHETERS) ×3 IMPLANT
KIT BASIN OR (CUSTOM PROCEDURE TRAY) ×3 IMPLANT
NS IRRIG 1000ML POUR BTL (IV SOLUTION) ×3 IMPLANT
POUCH SPECIMEN RETRIEVAL 10MM (ENDOMECHANICALS) ×3 IMPLANT
SCISSORS LAP 5X45 EPIX DISP (ENDOMECHANICALS) ×3 IMPLANT
SET IRRIG TUBING LAPAROSCOPIC (IRRIGATION / IRRIGATOR) ×3 IMPLANT
SLEEVE XCEL OPT CAN 5 100 (ENDOMECHANICALS) ×6 IMPLANT
SOLUTION ANTI FOG 6CC (MISCELLANEOUS) ×3 IMPLANT
STRIP CLOSURE SKIN 1/2X4 (GAUZE/BANDAGES/DRESSINGS) IMPLANT
SUT VIC AB 4-0 SH 18 (SUTURE) ×3 IMPLANT
SYR 20CC LL (SYRINGE) ×3 IMPLANT
TOWEL OR 17X26 10 PK STRL BLUE (TOWEL DISPOSABLE) ×3 IMPLANT
TOWEL OR NON WOVEN STRL DISP B (DISPOSABLE) ×3 IMPLANT
TRAY LAP CHOLE (CUSTOM PROCEDURE TRAY) ×3 IMPLANT
TROCAR BLADELESS OPT 5 100 (ENDOMECHANICALS) ×6 IMPLANT
TROCAR XCEL BLUNT TIP 100MML (ENDOMECHANICALS) ×3 IMPLANT
TROCAR XCEL NON-BLD 11X100MML (ENDOMECHANICALS) ×3 IMPLANT
TUBING INSUFFLATION 10FT LAP (TUBING) ×3 IMPLANT

## 2013-03-27 NOTE — Op Note (Signed)
Wynelle Link @date @  Procedure: Laparoscopic Cholecystectomy with intraoperative cholangiogram  Surgeon: Kaylyn Lim, MD, FACS Asst:  none  Anes:  General  Drains: None  Findings: Acute gangrenous cholecystitis  Description of Procedure: The patient was taken to OR 11 and given general anesthesia.  The patient was prepped with PCMX and draped sterilely. A time out was performed.  Access to the abdomen was achieved with a Hassan technique through the umbilicus.  Port placement included a 11 in the upper midline and two 5 mm trocars laterally.    The gallbladder was visualized and the fundus was grasped and the gallbladder was elevated. Traction on the infundibulum allowed for successful demonstration of the critical view. Inflammatory changes were marked and acute.  The cystic duct was identified and clipped up on the gallbladder and an incision was made in the cystic duct and the Reddick catheter was inserted after milking the cystic duct of any debris. A dynamic cholangiogram was performed which demonstrated a long cystic duct with distal insertion, no stones and good intrahepatic filling with free flow into the duodenum.    The cystic duct was then triple clipped and divided, the cystic artery was double clipped and divided and then the gallbladder was removed from the gallbladder bed. Removal of the gallbladder from the gallbladder bed was difficult because of the degree of inflammation.  The gallbladder was then placed in a nylon bag and brought out through the upper midline trocar site. The gallbladder bed was inspected and no bleeding or bile leaks were seen.   Laparoscopic visualization was used when closing fascial defects for trocar sites.   Incisions were injected with Marcaine and closed with 4-0 Vicryl and dermabond on the skin.  Sponge and needle count were correct.    The patient was taken to the recovery room in satisfactory condition.

## 2013-03-27 NOTE — Transfer of Care (Signed)
Immediate Anesthesia Transfer of Care Note  Patient: Amber [name]  Procedure(s) Performed: Procedure(s) (LRB): LAPAROSCOPIC CHOLECYSTECTOMY WITH INTRAOPERATIVE CHOLANGIOGRAM (N/A)  Patient Location: PACU  Anesthesia Type: General  Level of Consciousness: sedated, patient cooperative and responds to stimulation  Airway & Oxygen Therapy: Patient Spontanous Breathing and Patient connected to face mask oxgen  Post-op Assessment: Report given to PACU RN and Post -op Vital signs reviewed and stable  Post vital signs: Reviewed and stable  Complications: No apparent anesthesia complications

## 2013-03-27 NOTE — Preoperative (Signed)
Beta Blockers   Reason not to administer Beta Blockers:Not Applicable 

## 2013-03-27 NOTE — Progress Notes (Signed)
UR completed 

## 2013-03-27 NOTE — Progress Notes (Signed)
Pt's IV leaking a little at time of pick up for surgery. IV saline locked. Holly, RN in [place], made aware of leaking site. Anesthesia to assess. Jamal Collin in to transport pt to OR.

## 2013-03-27 NOTE — Progress Notes (Signed)
Amber Jansky, PA, aware via phone of pt's grogginess this am. Around 0815, Advertising copywriter and Nutritional therapist made primary nurse pt very groggy. Vital signs obtained with BP 170/65, low grade temp of 100.3 orally and sats 86% on RA. O2 @ 2L applied via [place]. Pt's husband at bedside concerned "pt not oneself". Grips and leg strength moderately equal bilaterally. Pt oriented to person and place only. By 1275, pt became more alert, oriented to person, place and time as well as year. Pt aware of plans for surgery today. BP 131/64 with HR 100. Grips and leg strength equal and strong bilateral. Dozing yet easily aroused. Husband remains at bedside. Reassurance given to both pt and spouse. See new order for EKG. Will plans to see pt soon.

## 2013-03-27 NOTE — Interval H&P Note (Signed)
History and Physical Interval Note:  03/27/2013 11:24 AM  Amber [name]  has presented today for surgery, with the diagnosis of CHOLECYSTITIS  The various methods of treatment have been discussed with the patient and family. After consideration of risks, benefits and other options for treatment, the patient has consented to  Procedure(s): LAPAROSCOPIC CHOLECYSTECTOMY WITH INTRAOPERATIVE CHOLANGIOGRAM (N/A) as a surgical intervention .  The patient's history has been reviewed, patient examined, no change in status, stable for surgery.  I have reviewed the patient's chart and labs.  Questions were answered to the patient's satisfaction.     Chantale Leugers B

## 2013-03-27 NOTE — Progress Notes (Signed)
Day of Surgery  Subjective: Called because at 8:15, she was sedated and groggy, it cleared some she could answer some questions, she has cleared and is perfectly clear now. Npo but got some oxycodone at [place] and 817 this AM  Objective: Vital signs in last 24 hours: Temp:  [98.3 F (36.8 C)-100.3 F (37.9 C)] 100.3 F (37.9 C) (02/12 0829) Pulse Rate:  [65-103] 103 (02/12 0829) Resp:  [14-18] 18 (02/12 0512) BP: (75-220)/(55-88) 134/61 mmHg (02/12 0512) SpO2:  [86 %-96 %] 86 % (02/12 0829) Weight:  [69.854 kg (154 lb)] 69.854 kg (154 lb) (02/11 1141) Last BM Date: 03/25/13 132/59  HR 110, she dropped her Sats to 86 on RA at 8:20, sats up to 92-93% on RA EKG shows Sinus tachycardia rate 108, no other significant findings PR 190, QTC 463. Currently she is normal. Temp 100.3 at 8 AM they gave her some tylenol too at that time temp still up some 99.5. K+ 3.5 and I have changed her IV orders. WBC 11.9 this AM CXR OK last PM Intake/Output from previous day: 02/11 0701 - 02/12 0700 In: 731.3 [I.V.:731.3] Out: 250 [Urine:250] Intake/Output this shift:    General appearance: alert, cooperative, no distress and answering all questions without difficulty Resp: clear to auscultation bilaterally Cardio: sinus tachycardia, but otherwise normal GI: soft, non-tender; bowel sounds normal; no masses,  no organomegaly and pain is right side and back this Am Neurologic: Grossly normal  Lab Results:   Recent Labs  03/26/13 1345 03/27/13 0516  WBC 9.8 11.9*  HGB 14.6 13.4  HCT 42.0 39.1  PLT 145* 147*    BMET  Recent Labs  03/26/13 1345 03/27/13 0516  NA 137 139  K 3.8 3.5*  CL 99 103  CO2 25 25  GLUCOSE 148* 129*  BUN 11 12  CREATININE 0.79 0.76  CALCIUM 8.8 8.2*   PT/INR  Recent Labs  03/27/13 0516  LABPROT 16.3*  INR 1.34     Recent Labs Lab 03/26/13 1345 03/27/13 0516  AST 23 19  ALT 20 17  ALKPHOS 82 69  BILITOT 0.6 0.8  PROT 8.3 7.0  ALBUMIN 3.5 2.8*      Lipase     Component Value Date/Time   LIPASE 20 03/27/2013 0516     Studies/Results: Dg Chest 2 View  03/26/2013   CLINICAL DATA:  Preoperative respiratory examination for gallbladder surgery.  EXAM: CHEST  2 VIEW  COMPARISON:  None.  FINDINGS: There is lordotic positioning on the AP view. The heart size and mediastinal contours are normal. The lungs are clear. There is no pleural effusion or pneumothorax. No acute osseous findings are identified.  IMPRESSION: No active cardiopulmonary process.   Electronically Signed   By: Camie Patience M.D.   On: 03/26/2013 17:46   Ct Abdomen Pelvis W Contrast  03/26/2013   CLINICAL DATA:  Upper abdominal pain for 2 days with nausea and vomiting, history of left leg sarcoma  EXAM: CT ABDOMEN AND PELVIS WITH CONTRAST  TECHNIQUE: Multidetector CT imaging of the abdomen and pelvis was performed using the standard protocol following bolus administration of intravenous contrast.  CONTRAST:  146mL OMNIPAQUE IOHEXOL 300 MG/ML  SOLN  COMPARISON:  None.  FINDINGS: Visualized lung bases clear. Bone windows negative for acute findings.  Liver, spleen, pancreas, and adrenal glands are normal. Right kidney is normal.  Aorta is calcified. There is diverticulosis of the sigmoid and descending colon. Bowel is otherwise normal. Appendix is normal.  Bladder is  normal. Reproductive organs appear to be absent. No ascites or significant adenopathy.  There are multiple calcified and noncalcified gallstones. Gallbladder wall appears mildly thickened with mild surrounding inflammatory change. Gallbladder is distended to about 10 cm.  In the midpole the left kidney, there is a low-attenuation circumscribed lesion measuring 14 x 10 mm. It demonstrates an average attenuation value of 49. There appears to be a mildly hyper attenuating nodule within this lesion.  IMPRESSION: 1. Findings are concerning for cholecystitis associated with cholelithiasis. 2. Indeterminate 14 mm left renal lesion.  Complex cyst versus malignancy both possible. Recommend nonemergent renal protocol MRI.   Electronically Signed   By: Skipper Cliche M.D.   On: 03/26/2013 15:00    Medications: . ampicillin-sulbactam (UNASYN) IV  3 g Intravenous Q6H  . atenolol  12.5 mg Oral Daily  . levothyroxine  75 mcg Oral QAC breakfast    Assessment/Plan   Some confusion this AM, with drop in O2 Sats, WBC up and temp up   Cholecystitis/cholelithiasis   Hx of left leg sarcoma with resection   Hypertension   Hyperlipidemia   Hypothyroid, on supplement.   GERD  Former smoker (60 pack year hx)   Left renal cyst found on CT scan  Plan: She got oxycodone at [facility] and 8AM, she is clear now and neuro exam is normal, her husband says she was normal now, but not earlier.   I will keep her NPO, give her some AM BB IV, stop oxycodone and discuss with Dr. Hassell Done.  Continue hydration and IV antibiotics   LOS: 1 day    Bilal Manzer 03/27/2013

## 2013-03-27 NOTE — Anesthesia Preprocedure Evaluation (Addendum)
Anesthesia Evaluation  Patient identified by MRN, date of birth, ID band Patient awake    Reviewed: Allergy & Precautions, H&P , NPO status , Patient's Chart, lab work & pertinent test results  Airway Mallampati: II TM Distance: >3 FB Neck ROM: full    Dental no notable dental hx. (+) Teeth Intact, Dental Advisory Given   Pulmonary neg pulmonary ROS, former smoker,  breath sounds clear to auscultation  Pulmonary exam normal       Cardiovascular Exercise Tolerance: Good hypertension, Pt. on home beta blockers Rhythm:regular Rate:Normal     Neuro/Psych negative neurological ROS  negative psych ROS   GI/Hepatic negative GI ROS, Neg liver ROS,   Endo/Other  negative endocrine ROS  Renal/GU negative Renal ROS  negative genitourinary   Musculoskeletal   Abdominal   Peds  Hematology negative hematology ROS (+)   Anesthesia Other Findings   Reproductive/Obstetrics negative OB ROS                          Anesthesia Physical Anesthesia Plan  ASA: II  Anesthesia Plan: General   Post-op Pain Management:    Induction: Intravenous  Airway Management Planned: Oral ETT  Additional Equipment:   Intra-op Plan:   Post-operative Plan: Extubation in OR  Informed Consent: I have reviewed the patients History and Physical, chart, labs and discussed the procedure including the risks, benefits and alternatives for the proposed anesthesia with the patient or authorized representative who has indicated his/her understanding and acceptance.   Dental Advisory Given  Plan Discussed with: CRNA and Surgeon  Anesthesia Plan Comments:         Anesthesia Quick Evaluation

## 2013-03-27 NOTE — Anesthesia Postprocedure Evaluation (Signed)
  Anesthesia Post-op Note  Patient: Amber [name]  Procedure(s) Performed: Procedure(s) (LRB): LAPAROSCOPIC CHOLECYSTECTOMY WITH INTRAOPERATIVE CHOLANGIOGRAM (N/A)  Patient Location: PACU  Anesthesia Type: General  Level of Consciousness: awake and alert   Airway and Oxygen Therapy: Patient Spontanous Breathing  Post-op Pain: mild  Post-op Assessment: Post-op Vital signs reviewed, Patient's Cardiovascular Status Stable, Respiratory Function Stable, Patent Airway and No signs of Nausea or vomiting  Last Vitals:  Filed Vitals:   03/27/13 1830  BP: 149/83  Pulse: 93  Temp: 36.3 C  Resp: 16    Post-op Vital Signs: stable   Complications: No apparent anesthesia complications

## 2013-03-27 NOTE — Progress Notes (Signed)
Dr Zella Richer notified patient with only 100 cc or dark urine since return from PACU @ pm.  Orders received to give 250cc normal saline bolus.

## 2013-03-28 ENCOUNTER — Encounter (HOSPITAL_COMMUNITY): Payer: Self-pay | Admitting: Surgery

## 2013-03-28 LAB — COMPREHENSIVE METABOLIC PANEL
ALT: 46 U/L — AB (ref 0–35)
AST: 53 U/L — AB (ref 0–37)
Albumin: 2.3 g/dL — ABNORMAL LOW (ref 3.5–5.2)
Alkaline Phosphatase: 63 U/L (ref 39–117)
BUN: 16 mg/dL (ref 6–23)
CALCIUM: 8.1 mg/dL — AB (ref 8.4–10.5)
CO2: 25 meq/L (ref 19–32)
Chloride: 101 mEq/L (ref 96–112)
Creatinine, Ser: 0.88 mg/dL (ref 0.50–1.10)
GFR calc Af Amer: 71 mL/min — ABNORMAL LOW (ref >90)
GFR, EST NON AFRICAN AMERICAN: 61 mL/min — AB (ref >90)
Glucose, Bld: 135 mg/dL — ABNORMAL HIGH (ref 70–99)
Potassium: 3.6 mEq/L — ABNORMAL LOW (ref 3.7–5.3)
Sodium: 137 mEq/L (ref 137–147)
Total Bilirubin: 0.6 mg/dL (ref 0.3–1.2)
Total Protein: 6 g/dL (ref 6.0–8.3)

## 2013-03-28 LAB — CBC
HEMATOCRIT: 32.6 % — AB (ref 36.0–46.0)
HEMOGLOBIN: 11 g/dL — AB (ref 12.0–15.0)
MCH: 30.3 pg (ref 26.0–34.0)
MCHC: 33.7 g/dL (ref 30.0–36.0)
MCV: 89.8 fL (ref 78.0–100.0)
Platelets: 112 10*3/uL — ABNORMAL LOW (ref 150–400)
RBC: 3.63 MIL/uL — ABNORMAL LOW (ref 3.87–5.11)
RDW: 13.1 % (ref 11.5–15.5)
WBC: 8.9 10*3/uL (ref 4.0–10.5)

## 2013-03-28 MED ORDER — IBUPROFEN 600 MG PO TABS
600.0000 mg | ORAL_TABLET | Freq: Four times a day (QID) | ORAL | Status: DC | PRN
  Filled 2013-03-28: qty 1

## 2013-03-28 MED ORDER — ACETAMINOPHEN 325 MG PO TABS: 650.0000 mg | ORAL_TABLET | ORAL | Status: DC | PRN

## 2013-03-28 MED ORDER — SIMVASTATIN 10 MG PO TABS
10.0000 mg | ORAL_TABLET | Freq: Every day | ORAL | Status: DC
  Filled 2013-03-28: qty 1

## 2013-03-28 MED ORDER — HYDROCODONE-ACETAMINOPHEN 5-325 MG PO TABS: 1.0000 | ORAL_TABLET | ORAL | Status: DC | PRN

## 2013-03-28 MED ORDER — ADULT MULTIVITAMIN W/MINERALS CH
1.0000 | ORAL_TABLET | Freq: Every evening | ORAL | Status: DC
  Filled 2013-03-28: qty 1

## 2013-03-28 MED ORDER — IBUPROFEN 200 MG PO TABS: ORAL_TABLET | ORAL | Status: DC

## 2013-03-28 MED ORDER — ASPIRIN 81 MG PO CHEW
81.0000 mg | CHEWABLE_TABLET | Freq: Every day | ORAL | Status: DC
  Administered 2013-03-28: 81 mg via ORAL
  Filled 2013-03-28 (×2): qty 1

## 2013-03-28 NOTE — Discharge Instructions (Signed)
[facility] ______[facility] °LAPAROSCOPIC SURGERY: POST OP INSTRUCTIONS °Always review your discharge instruction sheet given to you by the facility where your surgery was performed. °IF YOU HAVE DISABILITY OR FAMILY LEAVE FORMS, YOU MUST BRING THEM TO THE OFFICE FOR PROCESSING.   °DO NOT GIVE THEM TO YOUR DOCTOR. ° °1. A prescription for pain medication may be given to you upon discharge.  Take your pain medication as prescribed, if needed.  If narcotic pain medicine is not needed, then you may take acetaminophen (Tylenol) or ibuprofen (Advil) as needed. °2. Take your usually prescribed medications unless otherwise directed. °3. If you need a refill on your pain medication, please contact your pharmacy.  They will contact our office to request authorization. Prescriptions will not be filled after 5pm or on week-ends. °4. You should follow a light diet the first few days after arrival home, such as soup and crackers, etc.  Be sure to include lots of fluids daily. °5. Most patients will experience some swelling and bruising in the area of the incisions.  Ice packs will help.  Swelling and bruising can take several days to resolve.  °6. It is common to experience some constipation if taking pain medication after surgery.  Increasing fluid intake and taking a stool softener (such as Colace) will usually help or prevent this problem from occurring.  A mild laxative (Milk of Magnesia or Miralax) should be taken according to package instructions if there are no bowel movements after 48 hours. °7. Unless discharge instructions indicate otherwise, you may remove your bandages 24-48 hours after surgery, and you may shower at that time.  You may have steri-strips (small skin tapes) in place directly over the incision.  These strips should be left on the skin for 7-10 days.  If your surgeon used skin glue on the incision, you may shower in 24 hours.  The glue will flake off over the next 2-3 weeks.  Any sutures or  staples will be removed at the office during your follow-up visit. °8. ACTIVITIES:  You may resume regular (light) daily activities beginning the next day--such as daily self-care, walking, climbing stairs--gradually increasing activities as tolerated.  You may have sexual intercourse when it is comfortable.  Refrain from any heavy lifting or straining until approved by your doctor. °a. You may drive when you are no longer taking prescription pain medication, you can comfortably wear a seatbelt, and you can safely maneuver your car and apply brakes. °b. RETURN TO WORK:  __________________________________________________________ °9. You should see your doctor in the office for a follow-up appointment approximately 2-3 weeks after your surgery.  Make sure that you call for this appointment within a day or two after you arrive home to insure a convenient appointment time. °10. OTHER INSTRUCTIONS: __________________________________________________________________________________________________________________________ __________________________________________________________________________________________________________________________ °WHEN TO CALL YOUR DOCTOR: °1. Fever over 101.0 °2. Inability to urinate °3. Continued bleeding from incision. °4. Increased pain, redness, or drainage from the incision. °5. Increasing abdominal pain ° °The clinic staff is available to answer your questions during regular business hours.  Please don’t hesitate to call and ask to speak to one of the nurses for clinical concerns.  If you have a medical emergency, go to the nearest emergency room or call 911.  A surgeon from [facility] is always on call at the hospital. °[address], [address], [place], [place]  [address] ? [address] [address], [place], [place]   [address] °[phone] ? [phone] ? FAX [phone] °Web site:   [email or web address] ° °Laparoscopic Cholecystectomy °Laparoscopic cholecystectomy  is surgery to remove the gallbladder. The gallbladder is located in the upper right part of the abdomen, behind the liver. It is a storage sac for bile produced in the liver. Bile aids in the digestion and absorption of fats. Cholecystectomy is often done for inflammation of the gallbladder (cholecystitis). This condition is usually caused by a buildup of gallstones (cholelithiasis) in your gallbladder. Gallstones can block the flow of bile, resulting in inflammation and pain. In severe cases, emergency surgery may be required. When emergency surgery is not required, you will have time to prepare for the procedure. °Laparoscopic surgery is an alternative to open surgery. Laparoscopic surgery has a shorter recovery time. Your common bile duct may also need to be examined during the procedure. If stones are found in the common bile duct, they may be removed. °LET YOUR HEALTH CARE PROVIDER KNOW ABOUT: °· Any allergies you have. °· All medicines you are taking, including vitamins, herbs, eye drops, creams, and over-the-counter medicines. °· Previous problems you or members of your family have had with the use of anesthetics. °· Any blood disorders you have. °· Previous surgeries you have had. °· Medical conditions you have. °RISKS AND COMPLICATIONS °Generally, this is a safe procedure. However, as with any procedure, complications can occur. Possible complications include: °· Infection. °· Damage to the common bile duct, nerves, arteries, veins, or other internal organs such as the stomach, liver, or intestines. °· Bleeding. °· A stone may remain in the common bile duct. °· A bile leak from the cyst duct that is clipped when your gallbladder is removed. °· The need to convert to open surgery, which requires a larger incision in the abdomen. This may be necessary if your surgeon thinks it is not safe to continue with a laparoscopic procedure. °BEFORE THE PROCEDURE °· Ask your health care provider about changing or  stopping any regular medicines. You will need to stop taking aspirin or blood thinners at least 5 days prior to surgery. °· Do not eat or drink anything after midnight the night before surgery. °· Let your health care provider know if you develop a cold or other infectious problem before surgery. °PROCEDURE  °· You will be given medicine to make you sleep through the procedure (general anesthetic). A breathing tube will be placed in your mouth. °· When you are asleep, your surgeon will make several small cuts (incisions) in your abdomen. °· A thin, lighted tube with a tiny camera on the end (laparoscope) is inserted through one of the small incisions. The camera on the laparoscope sends a picture to a TV screen in the operating room. This gives the surgeon a good view inside your abdomen. °· A gas will be pumped into your abdomen. This expands your abdomen so that the surgeon has more room to perform the surgery. °· Other tools needed for the procedure are inserted through the other incisions. The gallbladder is removed through one of the incisions. °· After the removal of your gallbladder, the incisions will be closed with stitches, staples, or skin glue. °AFTER THE PROCEDURE °· You will be taken to a recovery area where your progress will be checked often. °· You may be allowed to go home the same day if your pain is controlled and you can tolerate liquids. °Document Released: 01/30/2005 Document Revised: 11/20/2012 Document Reviewed: 09/11/2012 °[employer]® Patient Information ©2014 [employer]. ° °

## 2013-03-28 NOTE — Progress Notes (Signed)
Pt left at this time with her daughter and spouse at her side. Alert, oriented, and without c/o. Discharge instructions/prescription given/explained with pt verbalizing understanding. Followup appointments noted.

## 2013-03-28 NOTE — Discharge Summary (Signed)
Physician Discharge Summary  Patient ID: Amber [name] MRN: [id number] DOB/AGE: 1932/08/19 80 y.o.  Admit date: 03/26/2013 Discharge date: 03/28/2013  Admission Diagnoses:  Cholecystitis/cholelithiasis  Hx of left leg sarcoma with resection  Hypertension  Hyperlipidemia  Hypothyroid, on supplement.  GERD  Former smoker (60 pack year hx)  Left renal cyst found on CT scan   Discharge Diagnoses:  Acute gangrenous cholecystitis Hx of left leg sarcoma with resection  Hypertension  Hyperlipidemia  Hypothyroid, on supplement.  GERD  Former smoker (60 pack year hx)  Left renal cyst found on CT scan (14 mm cyst vs possible malignancy)  Active Problems:   Cholecystitis, acute with cholelithiasis   Status post laparoscopic cholecystectomy   PROCEDURES: S/p Laparoscopic Cholecystectomy with intraoperative cholangiogram, 03/27/2013, Pedro Earls, MD   Hospital Course: 80 y/o female in fairly good health had abdominal pain Monday PM, 1st episode, nothing like this before, different from GERD, in Mid abdominal region. Pain got better Monday PM, 03/24/13. She did well went to a [employer] supper at church 2/10, and pain reoccurred that afternoon from 4PM till she got treated here in the ER 03/26/13. She is currently pain free, she has had GERD, but this is clearly different. Pain is again mid epigastric area. Some nausea, but no vomiting, some dry heaves.  IN ER CT scan shows: There are multiple calcified and noncalcified gallstones. Gallbladder wall appears mildly thickened with mild surrounding inflammatory change. Gallbladder is distended to about 10 cm.  There was also an "Indeterminate 14 mm left renal lesion. Complex cyst versus malignancy both possible."  Recommend nonemergent renal protocol MRI.   CBC, Lipase, and CMP are normal. CT also shows a renal cyst and calcified aorta.  She is currently pain free. She has not eaten today. She was admitted and placed on IV antibiotics,  hydration, and pain medications.  The following AM we did receive a call from the nurse saying she was confused earlier, and her husband agreed.  She was back to baseline when I arrived.  She was somewhat tachycardic, but it was a sinus tachycardia. Her WBC was up from admission and she had a low grade fever.  We discontinue her current PO pain medication.  We went forward with the surgery and she was found to have a gangrenous gallbladder.  She did well post op and we anticipate her discharge after lunch.  We mobilized her and changed her from oxycodone to hydrocodone and see how she does with that for pain.  She does have a renal cyst vs mass noted on CT, we will defer to PCP to follow up with this as needed.   Disposition:  Home  Condition on d/c:  Improved      Medication List         acetaminophen 325 MG tablet  Commonly known as:  TYLENOL  Take 2 tablets (650 mg total) by mouth every 4 (four) hours as needed for mild pain (Do not take more than 4000 mg of Tylenol (acetaminophen) per day.  This is in the prescription pain medicine.).     aspirin 81 MG tablet  Take 81 mg by mouth daily.     atenolol 25 MG tablet  Commonly known as:  TENORMIN  Take 12.5 mg by mouth daily.     HYDROcodone-acetaminophen 5-325 MG per tablet  Commonly known as:  NORCO/VICODIN  Take 1-2 tablets by mouth every 4 (four) hours as needed for moderate pain or severe pain.  ibuprofen 200 MG tablet  Commonly known as:  ADVIL,MOTRIN  You can take 2-3 tablets every 6 hours as needed for pain.  Use this first then the prescription medicine, if you need something more.     levothyroxine 75 MCG tablet  Commonly known as:  SYNTHROID, LEVOTHROID  Take 75 mcg by mouth daily before breakfast.     pravastatin 20 MG tablet  Commonly known as:  PRAVACHOL  Take 20 mg by mouth at bedtime.     VIACTIV MULTI-VITAMIN Chew  Chew 1 tablet by mouth every evening.       Follow-up Information   Follow up with  Johnathan Hausen B, MD. Schedule an appointment as soon as possible for a visit in 2 weeks. (Call for an appointment in 2-3 weeks.  )    Specialty:  General Surgery   Contact information:   [address] [place] [address] [phone]       Follow up with BURNETT,BRENT A, MD. (Call and let him know you had surgery and follow up with him.  He can follow up on your renal cyst as needed.)    Specialty:  Family Medicine   Contact information:   [address] [place] [address] [phone]       Signed: Earnstine Regal 03/28/2013, 12:51 PM

## 2013-03-28 NOTE — Progress Notes (Signed)
1 Day Post-Op  Subjective: She is feeling better got some extra fluid last PM,  I got her up to chair and she really did pretty well.  Trying to eat breakfast, but seems confused about her choice of foods.    Objective: Vital signs in last 24 hours: Temp:  [97.4 F (36.3 C)-99.5 F (37.5 C)] 99.5 F (37.5 C) (02/13 0558) Pulse Rate:  [89-116] 89 (02/13 0558) Resp:  [7-18] 18 (02/13 0558) BP: (107-149)/(57-83) 116/59 mmHg (02/13 0558) SpO2:  [89 %-100 %] 96 % (02/13 0737) Last BM Date: 03/25/13 420 PO recorded. TM 99.5 K+ 3.6 AST/ALT up minimally H?H down some this AM Intake/Output from previous day: 02/12 0701 - 02/13 0700 In: 4091.7 [P.O.:420; I.V.:3221.7; IV Piggyback:450] Out: 750 [Urine:750] Intake/Output this shift: Total I/O In: -  Out: 400 [Urine:400]  General appearance: alert, cooperative and no distress Resp: clear to auscultation bilaterally GI: soft, sore, few BS, port sites look good.  Lab Results:   Recent Labs  03/27/13 1630 03/28/13 0501  WBC 10.0 8.9  HGB 12.6 11.0*  HCT 36.7 32.6*  PLT 118* 112*    BMET  Recent Labs  03/27/13 0516 03/27/13 1630 03/28/13 0501  NA 139  --  137  K 3.5*  --  3.6*  CL 103  --  101  CO2 25  --  25  GLUCOSE 129*  --  135*  BUN 12  --  16  CREATININE 0.76 1.07 0.88  CALCIUM 8.2*  --  8.1*   PT/INR  Recent Labs  03/27/13 0516  LABPROT 16.3*  INR 1.34     Recent Labs Lab 03/26/13 1345 03/27/13 0516 03/28/13 0501  AST 23 19 53*  ALT 20 17 46*  ALKPHOS 82 69 63  BILITOT 0.6 0.8 0.6  PROT 8.3 7.0 6.0  ALBUMIN 3.5 2.8* 2.3*     Lipase     Component Value Date/Time   LIPASE 20 03/27/2013 0516     Studies/Results: Dg Chest 2 View  03/26/2013   CLINICAL DATA:  Preoperative respiratory examination for gallbladder surgery.  EXAM: CHEST  2 VIEW  COMPARISON:  None.  FINDINGS: There is lordotic positioning on the AP view. The heart size and mediastinal contours are normal. The lungs are clear.  There is no pleural effusion or pneumothorax. No acute osseous findings are identified.  IMPRESSION: No active cardiopulmonary process.   Electronically Signed   By: Camie Patience M.D.   On: 03/26/2013 17:46   Dg Cholangiogram Operative  03/27/2013   CLINICAL DATA:  Cholelithiasis  EXAM: INTRAOPERATIVE CHOLANGIOGRAM  TECHNIQUE: Cholangiographic images from the C-arm fluoroscopic device were submitted for interpretation post-operatively. Please see the procedural report for the amount of contrast and the fluoroscopy time utilized.  COMPARISON:  None.  FINDINGS: No persistent filling defects in the common duct. Intrahepatic ducts are incompletely visualized, appearing decompressed centrally. Contrast passes into the duodenum.  : Negative for retained common duct stone.   Electronically Signed   By: Arne Cleveland M.D.   On: 03/27/2013 14:09   Ct Abdomen Pelvis W Contrast  03/26/2013   CLINICAL DATA:  Upper abdominal pain for 2 days with nausea and vomiting, history of left leg sarcoma  EXAM: CT ABDOMEN AND PELVIS WITH CONTRAST  TECHNIQUE: Multidetector CT imaging of the abdomen and pelvis was performed using the standard protocol following bolus administration of intravenous contrast.  CONTRAST:  142mL OMNIPAQUE IOHEXOL 300 MG/ML  SOLN  COMPARISON:  None.  FINDINGS: Visualized lung  bases clear. Bone windows negative for acute findings.  Liver, spleen, pancreas, and adrenal glands are normal. Right kidney is normal.  Aorta is calcified. There is diverticulosis of the sigmoid and descending colon. Bowel is otherwise normal. Appendix is normal.  Bladder is normal. Reproductive organs appear to be absent. No ascites or significant adenopathy.  There are multiple calcified and noncalcified gallstones. Gallbladder wall appears mildly thickened with mild surrounding inflammatory change. Gallbladder is distended to about 10 cm.  In the midpole the left kidney, there is a low-attenuation circumscribed lesion measuring 14  x 10 mm. It demonstrates an average attenuation value of 49. There appears to be a mildly hyper attenuating nodule within this lesion.  IMPRESSION: 1. Findings are concerning for cholecystitis associated with cholelithiasis. 2. Indeterminate 14 mm left renal lesion. Complex cyst versus malignancy both possible. Recommend nonemergent renal protocol MRI.   Electronically Signed   By: Skipper Cliche M.D.   On: 03/26/2013 15:00    Medications: . ampicillin-sulbactam (UNASYN) IV  3 g Intravenous Q6H  . atenolol  12.5 mg Oral Daily  . bupivacaine liposome  20 mL Infiltration Once  . heparin subcutaneous  5,000 Units Subcutaneous 3 times per day  . levothyroxine  75 mcg Oral QAC breakfast  . metoprolol  2.5 mg Intravenous Once   Prior to Admission medications   Medication Sig Start Date End Date Taking? Authorizing Provider  aspirin 81 MG tablet Take 81 mg by mouth daily.   Yes Historical Provider, MD  atenolol (TENORMIN) 25 MG tablet Take 12.5 mg by mouth daily.   Yes Historical Provider, MD  levothyroxine (SYNTHROID, LEVOTHROID) 75 MCG tablet Take 75 mcg by mouth daily before breakfast.   Yes Historical Provider, MD  Multiple Vitamins-Calcium (VIACTIV MULTI-VITAMIN) CHEW Chew 1 tablet by mouth every evening.   Yes Historical Provider, MD  pravastatin (PRAVACHOL) 20 MG tablet Take 20 mg by mouth at bedtime.   Yes Historical Provider, MD     Assessment/Plan Acute gangrenous cholecystitis S/p Laparoscopic Cholecystectomy with intraoperative cholangiogram, 03/27/2013, Pedro Earls, MD  Hx of left leg sarcoma with resection  Hypertension  Hyperlipidemia  Hypothyroid, on supplement.  GERD  Former smoker (60 pack year hx)  Left renal cyst found on CT scan  Plan:  Mobilize and see how she does, restart her home meds, try Vicodin, instead of oxycodone, add Motrin also as choices.  Discuss need to send home on antibiotics with Dr. Hassell Done.  May be able to send home later today.     LOS: 2  days    Nessie Nong 03/28/2013

## 2013-04-10 ENCOUNTER — Encounter (INDEPENDENT_AMBULATORY_CARE_PROVIDER_SITE_OTHER): Payer: Medicare Other | Admitting: Surgery

## 2013-04-14 ENCOUNTER — Other Ambulatory Visit: Payer: Self-pay | Admitting: Family Medicine

## 2013-04-14 DIAGNOSIS — N2889 Other specified disorders of kidney and ureter: Secondary | ICD-10-CM

## 2013-04-16 ENCOUNTER — Ambulatory Visit
Admission: RE | Admit: 2013-04-16 | Discharge: 2013-04-16 | Disposition: A | Discharge status: Home / Self Care | Payer: Medicare Other | Source: Ambulatory Visit | Attending: Family Medicine | Admitting: Family Medicine

## 2013-04-16 DIAGNOSIS — N2889 Other specified disorders of kidney and ureter: Secondary | ICD-10-CM

## 2013-04-16 MED ORDER — GADOBENATE DIMEGLUMINE 529 MG/ML IV SOLN
14.0000 mL | Freq: Once | INTRAVENOUS | Status: AC | PRN
  Administered 2013-04-16: 14 mL via INTRAVENOUS

## 2013-04-22 ENCOUNTER — Telehealth (INDEPENDENT_AMBULATORY_CARE_PROVIDER_SITE_OTHER): Payer: Self-pay

## 2013-04-22 NOTE — Telephone Encounter (Signed)
Called and spoke to patient with po appt date & time for 05/02/13 @ 11:10.  Patient was r/s due to inclement weather on 04/10/13

## 2013-04-22 NOTE — Telephone Encounter (Signed)
Message copied by Ivor Costa on Tue Apr 22, 2013 10:38 AM ------      Message from: Humphrey Rolls K      Created: Mon Mar 31, 2013 11:34 AM      Regarding: Dr Hassell Done      Contact: [phone]       Patient needs 1st po for sx on 03/26/13 ------

## 2013-05-02 ENCOUNTER — Encounter (INDEPENDENT_AMBULATORY_CARE_PROVIDER_SITE_OTHER): Payer: Self-pay | Admitting: Surgery

## 2013-05-02 ENCOUNTER — Ambulatory Visit (INDEPENDENT_AMBULATORY_CARE_PROVIDER_SITE_OTHER): Payer: Medicare Other | Admitting: Surgery

## 2013-05-02 VITALS — BP 138/86 | HR 66 | Temp 97.6°F | Resp 16 | Ht 68.75 in | Wt 153.0 lb

## 2013-05-02 DIAGNOSIS — Z9049 Acquired absence of other specified parts of digestive tract: Secondary | ICD-10-CM

## 2013-05-02 DIAGNOSIS — Z9889 Other specified postprocedural states: Secondary | ICD-10-CM

## 2013-05-02 NOTE — Progress Notes (Signed)
Amber [name] 80 y.o.  Body mass index is 22.77 kg/(m^2).  Patient Active Problem List   Diagnosis Date Noted  . Status post laparoscopic cholecystectomy 03/27/2013  . Cholecystitis, acute with cholelithiasis 03/26/2013    No Known Allergies  Past Surgical History  Procedure Laterality Date  . Abdominal hysterectomy    . Knee surgery Left     approx 4 surgeries on left knee  . Cholecystectomy N/A 03/27/2013    Procedure: LAPAROSCOPIC CHOLECYSTECTOMY WITH INTRAOPERATIVE CHOLANGIOGRAM;  Surgeon: Pedro Earls, MD;  Location: [facility];  Service: General;  Laterality: N/A;   [name], MD No diagnosis found.  Doing well after her gangrenous cholecystitis was treated by lap chole.  Incisions OK.  MRI reviewed and copy given to her.  She will have followup CT in 6 months. Return prn. [name] Done, MD, [facility] [phone] beeper [phone]  05/02/2013 12:18 PM

## 2013-05-02 NOTE — Patient Instructions (Signed)
Laparoscopic Cholecystectomy °Laparoscopic cholecystectomy is surgery to remove the gallbladder. The gallbladder is located in the upper right part of the abdomen, behind the liver. It is a storage sac for bile produced in the liver. Bile aids in the digestion and absorption of fats. Cholecystectomy is often done for inflammation of the gallbladder (cholecystitis). This condition is usually caused by a buildup of gallstones (cholelithiasis) in your gallbladder. Gallstones can block the flow of bile, resulting in inflammation and pain. In severe cases, emergency surgery may be required. When emergency surgery is not required, you will have time to prepare for the procedure. °Laparoscopic surgery is an alternative to open surgery. Laparoscopic surgery has a shorter recovery time. Your common bile duct may also need to be examined during the procedure. If stones are found in the common bile duct, they may be removed. °LET YOUR HEALTH CARE PROVIDER KNOW ABOUT: °· Any allergies you have. °· All medicines you are taking, including vitamins, herbs, eye drops, creams, and over-the-counter medicines. °· Previous problems you or members of your family have had with the use of anesthetics. °· Any blood disorders you have. °· Previous surgeries you have had. °· Medical conditions you have. °RISKS AND COMPLICATIONS °Generally, this is a safe procedure. However, as with any procedure, complications can occur. Possible complications include: °· Infection. °· Damage to the common bile duct, nerves, arteries, veins, or other internal organs such as the stomach, liver, or intestines. °· Bleeding. °· A stone may remain in the common bile duct. °· A bile leak from the cyst duct that is clipped when your gallbladder is removed. °· The need to convert to open surgery, which requires a larger incision in the abdomen. This may be necessary if your surgeon thinks it is not safe to continue with a laparoscopic procedure. °BEFORE THE  PROCEDURE °· Ask your health care provider about changing or stopping any regular medicines. You will need to stop taking aspirin or blood thinners at least 5 days prior to surgery. °· Do not eat or drink anything after midnight the night before surgery. °· Let your health care provider know if you develop a cold or other infectious problem before surgery. °PROCEDURE  °· You will be given medicine to make you sleep through the procedure (general anesthetic). A breathing tube will be placed in your mouth. °· When you are asleep, your surgeon will make several small cuts (incisions) in your abdomen. °· A thin, lighted tube with a tiny camera on the end (laparoscope) is inserted through one of the small incisions. The camera on the laparoscope sends a picture to a TV screen in the operating room. This gives the surgeon a good view inside your abdomen. °· A gas will be pumped into your abdomen. This expands your abdomen so that the surgeon has more room to perform the surgery. °· Other tools needed for the procedure are inserted through the other incisions. The gallbladder is removed through one of the incisions. °· After the removal of your gallbladder, the incisions will be closed with stitches, staples, or skin glue. °AFTER THE PROCEDURE °· You will be taken to a recovery area where your progress will be checked often. °· You may be allowed to go home the same day if your pain is controlled and you can tolerate liquids. °Document Released: 01/30/2005 Document Revised: 11/20/2012 Document Reviewed: 09/11/2012 °ExitCare® Patient Information ©2014 [employer]. ° °

## 2013-10-15 ENCOUNTER — Other Ambulatory Visit: Payer: Self-pay | Admitting: Family Medicine

## 2013-10-15 DIAGNOSIS — N2889 Other specified disorders of kidney and ureter: Secondary | ICD-10-CM

## 2013-10-29 ENCOUNTER — Ambulatory Visit
Admission: RE | Admit: 2013-10-29 | Discharge: 2013-10-29 | Disposition: A | Discharge status: Home / Self Care | Payer: Medicare Other | Source: Ambulatory Visit | Attending: Family Medicine | Admitting: Family Medicine

## 2013-10-29 DIAGNOSIS — N2889 Other specified disorders of kidney and ureter: Secondary | ICD-10-CM

## 2013-10-29 MED ORDER — IOHEXOL 300 MG/ML  SOLN
100.0000 mL | Freq: Once | INTRAMUSCULAR | Status: AC | PRN
  Administered 2013-10-29: 100 mL via INTRAVENOUS

## 2014-08-13 ENCOUNTER — Emergency Department (HOSPITAL_COMMUNITY)
Admission: EM | Admit: 2014-08-13 | Discharge: 2014-08-14 | Disposition: A | Discharge status: Home / Self Care | Payer: Medicare Other | Attending: Emergency Medicine | Admitting: Emergency Medicine

## 2014-08-13 ENCOUNTER — Encounter (HOSPITAL_COMMUNITY): Payer: Self-pay

## 2014-08-13 DIAGNOSIS — Z85831 Personal history of malignant neoplasm of soft tissue: Secondary | ICD-10-CM | POA: Diagnosis not present

## 2014-08-13 DIAGNOSIS — Z79899 Other long term (current) drug therapy: Secondary | ICD-10-CM | POA: Diagnosis not present

## 2014-08-13 DIAGNOSIS — I10 Essential (primary) hypertension: Secondary | ICD-10-CM | POA: Insufficient documentation

## 2014-08-13 DIAGNOSIS — Z7982 Long term (current) use of aspirin: Secondary | ICD-10-CM | POA: Diagnosis not present

## 2014-08-13 DIAGNOSIS — E079 Disorder of thyroid, unspecified: Secondary | ICD-10-CM | POA: Insufficient documentation

## 2014-08-13 DIAGNOSIS — Z87891 Personal history of nicotine dependence: Secondary | ICD-10-CM | POA: Insufficient documentation

## 2014-08-13 DIAGNOSIS — R21 Rash and other nonspecific skin eruption: Secondary | ICD-10-CM | POA: Diagnosis present

## 2014-08-13 DIAGNOSIS — L509 Urticaria, unspecified: Secondary | ICD-10-CM | POA: Insufficient documentation

## 2014-08-13 DIAGNOSIS — E785 Hyperlipidemia, unspecified: Secondary | ICD-10-CM | POA: Diagnosis not present

## 2014-08-13 MED ORDER — FAMOTIDINE 20 MG PO TABS: 20.0000 mg | ORAL_TABLET | Freq: Two times a day (BID) | ORAL | Status: DC

## 2014-08-13 MED ORDER — PREDNISONE 20 MG PO TABS
40.0000 mg | ORAL_TABLET | Freq: Once | ORAL | Status: AC
  Administered 2014-08-13: 40 mg via ORAL
  Filled 2014-08-13: qty 2

## 2014-08-13 MED ORDER — FAMOTIDINE 20 MG PO TABS
20.0000 mg | ORAL_TABLET | Freq: Once | ORAL | Status: AC
  Administered 2014-08-13: 20 mg via ORAL
  Filled 2014-08-13: qty 1

## 2014-08-13 MED ORDER — PREDNISONE 20 MG PO TABS: 40.0000 mg | ORAL_TABLET | Freq: Every day | ORAL | Status: AC

## 2014-08-13 MED ORDER — DIPHENHYDRAMINE HCL 25 MG PO TABS: 25.0000 mg | ORAL_TABLET | Freq: Four times a day (QID) | ORAL | Status: DC

## 2014-08-13 NOTE — ED Notes (Signed)
Pt complains of a rash that she noticed this afternoon on her stomach, back, arms and hairline, she used a medicated cream on her husband and that's all she knows is different

## 2014-08-13 NOTE — ED Provider Notes (Signed)
CSN: [id number]     Arrival date & time 08/13/14  2248 History   This chart was scribed for Charlann Lange, PA-C working with [place], DO by Randa Evens, ED Scribe. This patient was seen in room [facility] and the patient's care was started at 11:49 PM.      Chief Complaint  Patient presents with  . Rash   Patient is a 81 y.o. female presenting with rash. The history is provided by the patient. No language interpreter was used.  Rash Associated symptoms: no shortness of breath    HPI Comments: Amber [name] is a 81 y.o. female who presents to the Emergency Department complaining of sudden rash on her abdomen, back, arms and face. Pt states that she did rub a medicated cream on her husband today and was the only new thing today. Pt states she applied vinegar and alcohol with no relief. Pt denies any new foods, medications or clothing. Pt denies SOB, facial swelling or trouble swallowing.   Past Medical History  Diagnosis Date  . Hypertension   . Hyperlipidemia   . Thyroid disease   . sarcoma of lt leg dx'd 1989    xrt and chemo and surg  . Complication of anesthesia     "woke up jumping and bouncing"   Past Surgical History  Procedure Laterality Date  . Abdominal hysterectomy    . Knee surgery Left     approx 4 surgeries on left knee  . Cholecystectomy N/A 03/27/2013    Procedure: LAPAROSCOPIC CHOLECYSTECTOMY WITH INTRAOPERATIVE CHOLANGIOGRAM;  Surgeon: Pedro Earls, MD;  Location: [facility];  Service: General;  Laterality: N/A;   History reviewed. No pertinent family history. History  Substance Use Topics  . Smoking status: Former Smoker    Quit date: 03/27/1983  . Smokeless tobacco: Never Used  . Alcohol Use: No   OB History    No data available      Review of Systems  HENT: Negative for facial swelling and trouble swallowing.   Respiratory: Negative for shortness of breath.   Skin: Positive for rash.  All other systems reviewed and are  negative.    Allergies  Review of patient's allergies indicates no known allergies.  Home Medications   Prior to Admission medications   Medication Sig Start Date End Date Taking? Authorizing Provider  aspirin 81 MG tablet Take 81 mg by mouth daily.   Yes Historical Provider, MD  atenolol (TENORMIN) 25 MG tablet Take 12.5 mg by mouth daily.   Yes Historical Provider, MD  levothyroxine (SYNTHROID, LEVOTHROID) 75 MCG tablet Take 75 mcg by mouth daily before breakfast.   Yes Historical Provider, MD  Multiple Vitamins-Calcium (VIACTIV MULTI-VITAMIN) CHEW Chew 1 tablet by mouth every evening.   Yes Historical Provider, MD  pravastatin (PRAVACHOL) 20 MG tablet Take 20 mg by mouth at bedtime.   Yes Historical Provider, MD  acetaminophen (TYLENOL) 325 MG tablet Take 2 tablets (650 mg total) by mouth every 4 (four) hours as needed for mild pain (Do not take more than 4000 mg of Tylenol (acetaminophen) per day.  This is in the prescription pain medicine.). Patient not taking: Reported on 08/13/2014 03/28/13   Earnstine Regal, PA-C  ibuprofen (ADVIL,MOTRIN) 200 MG tablet You can take 2-3 tablets every 6 hours as needed for pain.  Use this first then the prescription medicine, if you need something more. Patient not taking: Reported on 08/13/2014 03/28/13   Earnstine Regal, PA-C   BP 203/72 mmHg  Pulse 71  Temp(Src) 97.5 F (36.4 C) (Oral)  Resp 20  SpO2 99%   Physical Exam  Constitutional: She is oriented to person, place, and time. She appears well-developed and well-nourished. No distress.  HENT:  Head: Normocephalic and atraumatic.  Mouth/Throat: Oropharynx is clear and moist.  Eyes: Conjunctivae and EOM are normal.  Neck: Neck supple. No tracheal deviation present.  Cardiovascular: Normal rate.   Pulmonary/Chest: Effort normal and breath sounds normal. No respiratory distress. She has no wheezes. She has no rhonchi. She has no rales.  Musculoskeletal: Normal range of motion.   Neurological: She is alert and oriented to person, place, and time.  Skin: Skin is warm and dry. Rash noted.  Raised red rash in confluent patches consistent with hives in a general distribution.   Psychiatric: She has a normal mood and affect. Her behavior is normal.  Nursing note and vitals reviewed.   ED Course  Procedures (including critical care time) DIAGNOSTIC STUDIES: Oxygen Saturation is 99% on RA, normal by my interpretation.    COORDINATION OF CARE: 11:53 PM-Discussed treatment plan with pt at bedside and pt agreed to plan.     Labs Review Labs Reviewed - No data to display  Imaging Review No results found.   EKG Interpretation None      MDM   Final diagnoses:  None   1. Hives  Uncomplicated allergic reaction limited to rash. Stable for discharge home.    I personally performed the services described in this documentation, which was scribed in my presence. The recorded information has been reviewed and is accurate.       Charlann Lange, PA-C 08/21/14 [phone]

## 2014-08-13 NOTE — Discharge Instructions (Signed)
Hives Hives are itchy, red, swollen areas of the skin. They can vary in size and location on your body. Hives can come and go for hours or several days (acute hives) or for several weeks (chronic hives). Hives do not spread from person to person (noncontagious). They may get worse with scratching, exercise, and emotional stress. CAUSES   Allergic reaction to food, additives, or drugs.  Infections, including the common cold.  Illness, such as vasculitis, lupus, or thyroid disease.  Exposure to sunlight, heat, or cold.  Exercise.  Stress.  Contact with chemicals. SYMPTOMS   Red or white swollen patches on the skin. The patches may change size, shape, and location quickly and repeatedly.  Itching.  Swelling of the hands, feet, and face. This may occur if hives develop deeper in the skin. DIAGNOSIS  Your caregiver can usually tell what is wrong by performing a physical exam. Skin or blood tests may also be done to determine the cause of your hives. In some cases, the cause cannot be determined. TREATMENT  Mild cases usually get better with medicines such as antihistamines. Severe cases may require an emergency epinephrine injection. If the cause of your hives is known, treatment includes avoiding that trigger.  HOME CARE INSTRUCTIONS   Avoid causes that trigger your hives.  Take antihistamines as directed by your caregiver to reduce the severity of your hives. Non-sedating or low-sedating antihistamines are usually recommended. Do not drive while taking an antihistamine.  Take any other medicines prescribed for itching as directed by your caregiver.  Wear loose-fitting clothing.  Keep all follow-up appointments as directed by your caregiver. SEEK MEDICAL CARE IF:   You have persistent or severe itching that is not relieved with medicine.  You have painful or swollen joints. SEEK IMMEDIATE MEDICAL CARE IF:   You have a fever.  Your tongue or lips are swollen.  You have  trouble breathing or swallowing.  You feel tightness in the throat or chest.  You have abdominal pain. These problems may be the first sign of a life-threatening allergic reaction. Call your local emergency services (911 in [place] MAKE SURE YOU:   Understand these instructions.  Will watch your condition.  Will get help right away if you are not doing well or get worse. Document Released: 01/30/2005 Document Revised: 02/04/2013 Document Reviewed: 04/25/2011 ExitCare Patient Information 2015 [employer]. This information is not intended to replace advice given to you by your health care provider. Make sure you discuss any questions you have with your health care provider.  

## 2014-08-14 NOTE — ED Provider Notes (Signed)
Medical screening examination/treatment/procedure(s) were conducted as a shared visit with non-physician practitioner(s) and myself.  I personally evaluated the patient during the encounter.   EKG Interpretation None      Pt is a 81 y.o. female who presents emergency department with diffuse hives. No angioedema. No wheezing, hypoxia or respiratory difficulty. Suspect this was from a cream that she was using that is new to her. No other new exposure given Benadryl and the ED, Pepcid and exam. Will discharge with the same. Discussed return precautions.  [place], DO 08/14/14 [phone]

## 2016-01-19 ENCOUNTER — Emergency Department (HOSPITAL_COMMUNITY): Payer: Medicare Other

## 2016-01-19 ENCOUNTER — Encounter (HOSPITAL_COMMUNITY): Payer: Self-pay | Admitting: Emergency Medicine

## 2016-01-19 ENCOUNTER — Inpatient Hospital Stay (HOSPITAL_COMMUNITY)
Admission: EM | Admit: 2016-01-19 | Discharge: 2016-01-22 | DRG: 470 | Disposition: A | Discharge status: Home Health | Payer: Medicare Other | Attending: Internal Medicine | Admitting: Internal Medicine

## 2016-01-19 DIAGNOSIS — I10 Essential (primary) hypertension: Secondary | ICD-10-CM | POA: Diagnosis present

## 2016-01-19 DIAGNOSIS — Z801 Family history of malignant neoplasm of trachea, bronchus and lung: Secondary | ICD-10-CM

## 2016-01-19 DIAGNOSIS — Z9049 Acquired absence of other specified parts of digestive tract: Secondary | ICD-10-CM

## 2016-01-19 DIAGNOSIS — Z800 Family history of malignant neoplasm of digestive organs: Secondary | ICD-10-CM | POA: Diagnosis not present

## 2016-01-19 DIAGNOSIS — W1830XA Fall on same level, unspecified, initial encounter: Secondary | ICD-10-CM | POA: Diagnosis present

## 2016-01-19 DIAGNOSIS — D62 Acute posthemorrhagic anemia: Secondary | ICD-10-CM | POA: Diagnosis not present

## 2016-01-19 DIAGNOSIS — S72001A Fracture of unspecified part of neck of right femur, initial encounter for closed fracture: Secondary | ICD-10-CM | POA: Diagnosis present

## 2016-01-19 DIAGNOSIS — Z923 Personal history of irradiation: Secondary | ICD-10-CM | POA: Diagnosis not present

## 2016-01-19 DIAGNOSIS — W19XXXA Unspecified fall, initial encounter: Secondary | ICD-10-CM | POA: Diagnosis not present

## 2016-01-19 DIAGNOSIS — Z9221 Personal history of antineoplastic chemotherapy: Secondary | ICD-10-CM | POA: Diagnosis not present

## 2016-01-19 DIAGNOSIS — Z7901 Long term (current) use of anticoagulants: Secondary | ICD-10-CM | POA: Diagnosis not present

## 2016-01-19 DIAGNOSIS — Y92012 Bathroom of single-family (private) house as the place of occurrence of the external cause: Secondary | ICD-10-CM

## 2016-01-19 DIAGNOSIS — R42 Dizziness and giddiness: Secondary | ICD-10-CM

## 2016-01-19 DIAGNOSIS — Z66 Do not resuscitate: Secondary | ICD-10-CM | POA: Diagnosis present

## 2016-01-19 DIAGNOSIS — S72009A Fracture of unspecified part of neck of unspecified femur, initial encounter for closed fracture: Secondary | ICD-10-CM | POA: Diagnosis present

## 2016-01-19 DIAGNOSIS — D696 Thrombocytopenia, unspecified: Secondary | ICD-10-CM | POA: Diagnosis not present

## 2016-01-19 DIAGNOSIS — S72031A Displaced midcervical fracture of right femur, initial encounter for closed fracture: Secondary | ICD-10-CM | POA: Diagnosis present

## 2016-01-19 DIAGNOSIS — Z7982 Long term (current) use of aspirin: Secondary | ICD-10-CM

## 2016-01-19 DIAGNOSIS — E785 Hyperlipidemia, unspecified: Secondary | ICD-10-CM | POA: Diagnosis present

## 2016-01-19 DIAGNOSIS — E784 Other hyperlipidemia: Secondary | ICD-10-CM | POA: Diagnosis not present

## 2016-01-19 DIAGNOSIS — S72001S Fracture of unspecified part of neck of right femur, sequela: Secondary | ICD-10-CM | POA: Diagnosis not present

## 2016-01-19 DIAGNOSIS — Z9181 History of falling: Secondary | ICD-10-CM | POA: Diagnosis not present

## 2016-01-19 DIAGNOSIS — W19XXXS Unspecified fall, sequela: Secondary | ICD-10-CM | POA: Diagnosis not present

## 2016-01-19 DIAGNOSIS — Z87891 Personal history of nicotine dependence: Secondary | ICD-10-CM | POA: Diagnosis not present

## 2016-01-19 DIAGNOSIS — E038 Other specified hypothyroidism: Secondary | ICD-10-CM | POA: Diagnosis not present

## 2016-01-19 DIAGNOSIS — R262 Difficulty in walking, not elsewhere classified: Secondary | ICD-10-CM

## 2016-01-19 DIAGNOSIS — Z90710 Acquired absence of both cervix and uterus: Secondary | ICD-10-CM | POA: Diagnosis not present

## 2016-01-19 DIAGNOSIS — E039 Hypothyroidism, unspecified: Secondary | ICD-10-CM | POA: Diagnosis present

## 2016-01-19 LAB — TYPE AND SCREEN
ABO/RH(D): O NEG
ANTIBODY SCREEN: NEGATIVE

## 2016-01-19 LAB — BASIC METABOLIC PANEL
Anion gap: 8 (ref 5–15)
BUN: 16 mg/dL (ref 6–20)
CO2: 26 mmol/L (ref 22–32)
Calcium: 8.9 mg/dL (ref 8.9–10.3)
Chloride: 106 mmol/L (ref 101–111)
Creatinine, Ser: 0.77 mg/dL (ref 0.44–1.00)
GFR calc Af Amer: >60 mL/min (ref >60)
GLUCOSE: 92 mg/dL (ref 65–99)
Potassium: 3.6 mmol/L (ref 3.5–5.1)
Sodium: 140 mmol/L (ref 135–145)

## 2016-01-19 LAB — CBC WITH DIFFERENTIAL/PLATELET
Basophils Absolute: 0 10*3/uL (ref 0.0–0.1)
Basophils Relative: 0 %
Eosinophils Absolute: 0 10*3/uL (ref 0.0–0.7)
Eosinophils Relative: 1 %
HEMATOCRIT: 38.9 % (ref 36.0–46.0)
Hemoglobin: 13.1 g/dL (ref 12.0–15.0)
Lymphocytes Relative: 8 %
Lymphs Abs: 0.7 10*3/uL (ref 0.7–4.0)
MCH: 30.6 pg (ref 26.0–34.0)
MCHC: 33.7 g/dL (ref 30.0–36.0)
MCV: 90.9 fL (ref 78.0–100.0)
MONO ABS: 0.4 10*3/uL (ref 0.1–1.0)
Monocytes Relative: 5 %
NEUTROS ABS: 7.1 10*3/uL (ref 1.7–7.7)
Neutrophils Relative %: 86 %
Platelets: 181 10*3/uL (ref 150–400)
RBC: 4.28 MIL/uL (ref 3.87–5.11)
RDW: 13.5 % (ref 11.5–15.5)
WBC: 8.2 10*3/uL (ref 4.0–10.5)

## 2016-01-19 LAB — PROTIME-INR
INR: 1.06
Prothrombin Time: 13.8 seconds (ref 11.4–15.2)

## 2016-01-19 LAB — ABO/RH: ABO/RH(D): O NEG

## 2016-01-19 MED ORDER — SODIUM CHLORIDE 0.9 % IV SOLN: INTRAVENOUS | Status: DC

## 2016-01-19 MED ORDER — HYDROCODONE-ACETAMINOPHEN 5-325 MG PO TABS
1.0000 | ORAL_TABLET | Freq: Four times a day (QID) | ORAL | Status: DC | PRN
  Administered 2016-01-19 – 2016-01-21 (×2): 2 via ORAL
  Administered 2016-01-21: 1 via ORAL
  Filled 2016-01-19: qty 1
  Filled 2016-01-19 (×2): qty 2

## 2016-01-19 MED ORDER — MORPHINE SULFATE (PF) 2 MG/ML IV SOLN
0.5000 mg | INTRAVENOUS | Status: DC | PRN
  Administered 2016-01-19 – 2016-01-20 (×2): 0.5 mg via INTRAVENOUS
  Filled 2016-01-19: qty 1

## 2016-01-19 MED ORDER — HEPARIN SODIUM (PORCINE) 5000 UNIT/ML IJ SOLN
5000.0000 [IU] | Freq: Three times a day (TID) | INTRAMUSCULAR | Status: DC
  Administered 2016-01-19 – 2016-01-21 (×5): 5000 [IU] via SUBCUTANEOUS
  Filled 2016-01-19 (×5): qty 1

## 2016-01-19 MED ORDER — PRAVASTATIN SODIUM 20 MG PO TABS
20.0000 mg | ORAL_TABLET | Freq: Every day | ORAL | Status: DC
  Administered 2016-01-19 – 2016-01-21 (×3): 20 mg via ORAL
  Filled 2016-01-19 (×3): qty 1

## 2016-01-19 MED ORDER — LEVOTHYROXINE SODIUM 75 MCG PO TABS
75.0000 ug | ORAL_TABLET | Freq: Every day | ORAL | Status: DC
  Administered 2016-01-21 – 2016-01-22 (×2): 75 ug via ORAL
  Filled 2016-01-19 (×2): qty 1

## 2016-01-19 MED ORDER — POLYETHYLENE GLYCOL 3350 17 G PO PACK
17.0000 g | PACK | Freq: Every day | ORAL | Status: DC | PRN
  Administered 2016-01-22: 17 g via ORAL
  Filled 2016-01-19: qty 1

## 2016-01-19 MED ORDER — MORPHINE SULFATE (PF) 2 MG/ML IV SOLN
INTRAVENOUS | Status: AC
  Filled 2016-01-19: qty 1

## 2016-01-19 MED ORDER — SENNA 8.6 MG PO TABS
1.0000 | ORAL_TABLET | Freq: Two times a day (BID) | ORAL | Status: DC
  Administered 2016-01-19 – 2016-01-22 (×5): 8.6 mg via ORAL
  Filled 2016-01-19 (×5): qty 1

## 2016-01-19 NOTE — ED Notes (Signed)
Put pt on bedpan 2x. Pt unable to urinate. Will attempt female urinal

## 2016-01-19 NOTE — ED Notes (Signed)
NS started at 132ml/hr. MAR locked by another user

## 2016-01-19 NOTE — Plan of Care (Signed)
I spoke with Dr Fredonia Highland, ortho. He will schedule the surgery for tomorrow am at [facility]. NPO after midnight. Discussed with the patient, she is agreeable with the transfer.  Buyer, retail notified.    RAI,RIPUDEEP M.D. [facility] 01/19/2016, 5:40 PM  Pager: [phone]

## 2016-01-19 NOTE — ED Triage Notes (Addendum)
Per EMS. Pt lost balance while shaking out rug. Fell on to R hip and is having R hip pain. Pain with leg movement. No deformity noted by EMS. EMS gave 31mcg fentanyl prior to arrival. No lightheadedness. Did not hit head. Not on blood thinners. Pt reports she has had dizzy spells at home for the past 3-4 months since BP medication changed.

## 2016-01-19 NOTE — H&P (Signed)
History and Physical        Hospital Admission Note Date: 01/19/2016  Patient name: Amber [name] Medical record number: [id number] Date of birth: 21-Jun-1932 Age: 82 y.o. Gender: female  PCP: Heywood Bene, PA-C   Referring physician: Dr Kathrynn Humble  Patient coming from: Home   Chief Complaint:  Mechanical fall and lost balance with right hip pain  HPI: Patient is a 83 year old female with history of hypertension, hyperlipidemia, hypothyroidism, recent fall 2 weeks ago presented with mechanical fall today onto the right hip with right hip pain. History was obtained from the patient reported that she open her door to the bathroom and was shaking of the rug when she fell onto the right hip. She was able to wiggle back into the house and called for help. She had a mechanical fall 2 weeks ago and had fallen on her left hip at that time. Patient also reported that she has been having dizzy spells in the last 3-4 months since her BP medication has been changed by her PCP. She describes the dizzy spells, especially on standing up from the sitting position appears to be orthostatic. She usually does not feel dizzy while sitting or lying down. She did not have any dizziness, chest pain or shortness of breath at the time of this event.  ED work-up/course:  Temp 98.4 BP 161/90, O2 sats 98% on room air, respiratory rate 18 Sodium 140, potassium 3.6, BUN 16, creatinine 0.7. WBCs 8.2, hemoglobin 13.1, hematocrit 38.9, platelets 181 Right hip x-ray showed acute flatus angulated closed from cervical fracture of the right femur   Review of Systems: Positives marked in 'bold' Constitutional: Denies fever, chills, diaphoresis, poor appetite and fatigue.  HEENT: Denies photophobia, eye pain, redness, hearing loss, ear pain, congestion, sore throat, rhinorrhea, sneezing, mouth sores, trouble  swallowing, neck pain, neck stiffness and tinnitus.   Respiratory: Denies SOB, DOE, cough, chest tightness,  and wheezing.   Cardiovascular: Denies chest pain, palpitations and leg swelling.  Gastrointestinal: Denies nausea, vomiting, abdominal pain, diarrhea, constipation, blood in stool and abdominal distention.  Genitourinary: Denies dysuria, urgency, frequency, hematuria, flank pain and difficulty urinating.  Musculoskeletal: Please see history of present illness Skin: Denies pallor, rash and wound.  Neurological: Denies  seizures, syncope, weakness, numbness and headaches.  Hematological: Denies adenopathy. Easy bruising, personal or family bleeding history  Psychiatric/Behavioral: Denies suicidal ideation, mood changes, confusion, nervousness, sleep disturbance and agitation  Past Medical History: Past Medical History:  Diagnosis Date  . Complication of anesthesia    "woke up jumping and bouncing"  . Hyperlipidemia   . Hypertension   . 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 Thyroid disease     Past Surgical History:  Procedure Laterality Date  . ABDOMINAL HYSTERECTOMY    . CHOLECYSTECTOMY N/A 03/27/2013   Procedure: LAPAROSCOPIC CHOLECYSTECTOMY WITH INTRAOPERATIVE CHOLANGIOGRAM;  Surgeon: Pedro Earls, MD;  Location: [facility];  Service: General;  Laterality: N/A;  . KNEE SURGERY Left    approx 4 surgeries on left knee    Medications: Prior to Admission medications   Medication Sig Start Date End Date Taking? Authorizing Provider  aspirin 81 MG tablet Take 81 mg by mouth daily.   Yes Historical Provider, MD  benazepril (LOTENSIN) 20 MG tablet Take 20 mg by mouth daily. 11/16/15  Yes Historical Provider, MD  levothyroxine (SYNTHROID, LEVOTHROID) 75 MCG tablet Take 75 mcg by mouth daily before breakfast.   Yes Historical Provider, MD  lovastatin (MEVACOR) 20 MG tablet Take 20 mg by mouth at bedtime. 11/17/15  Yes Historical Provider, MD  Multiple  Vitamins-Calcium (VIACTIV MULTI-VITAMIN) CHEW Chew 1 tablet by mouth every evening.   Yes Historical Provider, MD  naproxen sodium (ANAPROX) 220 MG tablet Take 220 mg by mouth 2 (two) times daily with a meal.   Yes Historical Provider, MD  acetaminophen (TYLENOL) 325 MG tablet Take 2 tablets (650 mg total) by mouth every 4 (four) hours as needed for mild pain (Do not take more than 4000 mg of Tylenol (acetaminophen) per day.  This is in the prescription pain medicine.). Patient not taking: Reported on 01/19/2016 03/28/13   Earnstine Regal, PA-C  diphenhydrAMINE (BENADRYL) 25 MG tablet Take 1 tablet (25 mg total) by mouth every 6 (six) hours. Take as directed for 3 days then as needed for persistent symptoms of hives Patient not taking: Reported on 01/19/2016 08/13/14   Charlann Lange, PA-C  famotidine (PEPCID) 20 MG tablet Take 1 tablet (20 mg total) by mouth 2 (two) times daily. Take twice daily for 3 days then take as needed Patient not taking: Reported on 01/19/2016 08/13/14   Charlann Lange, PA-C  ibuprofen (ADVIL,MOTRIN) 200 MG tablet You can take 2-3 tablets every 6 hours as needed for pain.  Use this first then the prescription medicine, if you need something more. Patient not taking: Reported on 01/19/2016 03/28/13   Earnstine Regal, PA-C    Allergies:  No Known Allergies  Social History:  reports that she quit smoking about 32 years ago. She has never used smokeless tobacco. She reports that she does not drink alcohol or use drugs.  Family History: Family History  Problem Relation Age of Onset  . Lung cancer Sister   . Bone cancer Brother   . Stomach cancer Sister     Physical Exam: Blood pressure 161/90, pulse (!) 8, temperature 98 F (36.7 C), temperature source Oral, resp. rate 18, SpO2 98 %. General: Alert, awake, oriented x3, in no acute distress. HEENT: normocephalic, atraumatic, anicteric sclera, pink conjunctiva, pupils equal and reactive to light and accomodation, oropharynx  clear Neck: supple, no masses or lymphadenopathy, no goiter, no bruits  Heart: Regular rate and rhythm, without murmurs, rubs or gallops. Lungs: Clear to auscultation bilaterally, no wheezing, rales or rhonchi. Abdomen: Soft, nontender, nondistended, positive bowel sounds, no masses. Extremities: No clubbing, cyanosis or edema with positive pedal pulses. Neuro: Grossly intact, no focal neurological deficits. Difficult to check strength in the right lower extremity due to pain Psych: alert and oriented x 3, normal mood and affect Skin: no rashes or lesions, warm and dry   LABS on Admission:  Basic Metabolic Panel:  Recent Labs Lab 01/19/16 1455  NA 140  K 3.6  CL 106  CO2 26  GLUCOSE 92  BUN 16  CREATININE 0.77  CALCIUM 8.9   Liver Function Tests: No results for input(s): AST, ALT, ALKPHOS, BILITOT, PROT, ALBUMIN in the last 168 hours. No results for input(s): LIPASE, AMYLASE in the last 168 hours. No results for input(s): AMMONIA in the last 168 hours. CBC:  Recent Labs Lab 01/19/16 1455  WBC 8.2  NEUTROABS 7.1  HGB 13.1  HCT 38.9  MCV 90.9  PLT 181   Cardiac Enzymes: No results for input(s): CKTOTAL, CKMB, CKMBINDEX, TROPONINI in the last 168 hours. BNP: Invalid input(s): POCBNP CBG: No results for input(s): GLUCAP in the last 168 hours.  Radiological Exams on Admission:  No results found.  *I have personally reviewed the images above*  EKG: Independently reviewed. Rate 72, normal sinus rhythm   Assessment/Plan Principal Problem:   Closed right hip fracture (HCC) due to mechanical fall - No active cardiac symptoms, will admit on hip fracture protocol - EDP calling orthopedics - NPO, IV fluids, pain control  Active Problems:  Chronic Dizziness per patient started after changing medications - denies vertigo type symptoms - Patient's description is more consistent with orthostatic hypotension, check TSH, cortisol level - Difficult to workup with  current situation with hip fracture, will need orthostatics, PTOT evaluation after the surgery    Hypertension - will place on hydralazine prn as patient is NPO     Hypothyroidism - check TSH, continue Synthroid    Hyperlipidemia - Continue statin   DVT prophylaxis:  heparin subcutaneous   CODE STATUS:  discussed with the patient, DNR    Consults called:  EDP calling orthopedics   Family Communication: Admission, patients condition and plan of care including tests being ordered have been discussed with the patient and  Husband, daughter who indicates understanding and agree with the plan and Code Status  Admission status: INPATIENT  Disposition plan: Further plan will depend as patient's clinical course evolves and further radiologic and laboratory data become available. At the time of admission, it appears that the appropriate admission status for this patient is INPATIENT . This is judged to be reasonable and necessary in order to provide the required intensity of service to ensure the patient's safety given the presenting symptoms, physical exam findings, and initial radiographic and laboratory data in the context of their chronic comorbidities.     Time Spent on Admission: Adan Sis M.D. [facility] 01/19/2016, 4:13 PM Pager: [phone]  If 7PM-7AM, please contact night-coverage [email or web address] Password [email or web address]

## 2016-01-19 NOTE — Progress Notes (Addendum)
Report called to [facility], 22:00 scheduled meds given, and prn pain given. Pt has new R wrist #20g IVF NS at 100cc/hr. Ice placed to R Hip.   [facility] called and in route to pick up pt. Daughter Peter Congo called and informed of transfer.   Jennye Moccasin) to transfer pt. Daughter called and updated.

## 2016-01-20 ENCOUNTER — Encounter (HOSPITAL_COMMUNITY)
Admission: EM | Disposition: A | Discharge status: Home Health | Payer: Self-pay | Source: Home / Self Care | Attending: Internal Medicine

## 2016-01-20 ENCOUNTER — Inpatient Hospital Stay (HOSPITAL_COMMUNITY): Payer: Medicare Other | Admitting: Certified Registered Nurse Anesthetist

## 2016-01-20 ENCOUNTER — Inpatient Hospital Stay (HOSPITAL_COMMUNITY): Payer: Medicare Other

## 2016-01-20 ENCOUNTER — Encounter (HOSPITAL_COMMUNITY): Payer: Self-pay | Admitting: Orthopedic Surgery

## 2016-01-20 DIAGNOSIS — S72001S Fracture of unspecified part of neck of right femur, sequela: Secondary | ICD-10-CM

## 2016-01-20 DIAGNOSIS — R42 Dizziness and giddiness: Secondary | ICD-10-CM

## 2016-01-20 DIAGNOSIS — I10 Essential (primary) hypertension: Secondary | ICD-10-CM

## 2016-01-20 DIAGNOSIS — E784 Other hyperlipidemia: Secondary | ICD-10-CM

## 2016-01-20 HISTORY — PX: HIP ARTHROPLASTY: SHX981

## 2016-01-20 LAB — TSH: TSH: 1.77 u[IU]/mL (ref 0.350–4.500)

## 2016-01-20 SURGERY — HEMIARTHROPLASTY, HIP, DIRECT ANTERIOR APPROACH, FOR FRACTURE
Anesthesia: Monitor Anesthesia Care | Laterality: Right

## 2016-01-20 MED ORDER — ACETAMINOPHEN 500 MG PO TABS
1000.0000 mg | ORAL_TABLET | Freq: Once | ORAL | Status: AC
  Administered 2016-01-20: 1000 mg via ORAL
  Filled 2016-01-20: qty 2

## 2016-01-20 MED ORDER — BUPIVACAINE HCL (PF) 0.5 % IJ SOLN
INTRAMUSCULAR | Status: AC
  Filled 2016-01-20: qty 30

## 2016-01-20 MED ORDER — 0.9 % SODIUM CHLORIDE (POUR BTL) OPTIME
TOPICAL | Status: DC | PRN
  Administered 2016-01-20: 1000 mL

## 2016-01-20 MED ORDER — ACETAMINOPHEN 650 MG RE SUPP: 650.0000 mg | Freq: Four times a day (QID) | RECTAL | Status: DC | PRN

## 2016-01-20 MED ORDER — LIDOCAINE 2% (20 MG/ML) 5 ML SYRINGE
INTRAMUSCULAR | Status: AC
  Filled 2016-01-20: qty 5

## 2016-01-20 MED ORDER — PROPOFOL 10 MG/ML IV BOLUS
INTRAVENOUS | Status: DC | PRN
Start: 2016-01-20 — End: 2016-01-20
  Administered 2016-01-20: 20 mg via INTRAVENOUS

## 2016-01-20 MED ORDER — METHOCARBAMOL 500 MG PO TABS
ORAL_TABLET | ORAL | Status: AC
  Filled 2016-01-20: qty 1

## 2016-01-20 MED ORDER — FENTANYL CITRATE (PF) 100 MCG/2ML IJ SOLN
INTRAMUSCULAR | Status: DC | PRN
  Administered 2016-01-20: 50 ug via INTRAVENOUS

## 2016-01-20 MED ORDER — DIPHENHYDRAMINE HCL 12.5 MG/5ML PO ELIX
12.5000 mg | ORAL_SOLUTION | ORAL | Status: DC | PRN
  Administered 2016-01-22: 25 mg via ORAL
  Filled 2016-01-20: qty 10

## 2016-01-20 MED ORDER — KETOROLAC TROMETHAMINE 15 MG/ML IJ SOLN
INTRAMUSCULAR | Status: AC
Start: 2016-01-20 — End: 2016-01-20
  Filled 2016-01-20: qty 1

## 2016-01-20 MED ORDER — PROPOFOL 10 MG/ML IV BOLUS
INTRAVENOUS | Status: AC
  Filled 2016-01-20: qty 80

## 2016-01-20 MED ORDER — METOCLOPRAMIDE HCL 5 MG PO TABS: 5.0000 mg | ORAL_TABLET | Freq: Three times a day (TID) | ORAL | Status: DC | PRN

## 2016-01-20 MED ORDER — FENTANYL CITRATE (PF) 100 MCG/2ML IJ SOLN
INTRAMUSCULAR | Status: AC
  Filled 2016-01-20: qty 4

## 2016-01-20 MED ORDER — KETOROLAC TROMETHAMINE 30 MG/ML IJ SOLN
INTRAMUSCULAR | Status: DC | PRN
  Administered 2016-01-20: 30 mg via INTRAMUSCULAR

## 2016-01-20 MED ORDER — ROCURONIUM BROMIDE 10 MG/ML (PF) SYRINGE
PREFILLED_SYRINGE | INTRAVENOUS | Status: AC
  Filled 2016-01-20: qty 10

## 2016-01-20 MED ORDER — CHLORHEXIDINE GLUCONATE 4 % EX LIQD: 60.0000 mL | Freq: Once | CUTANEOUS | Status: DC

## 2016-01-20 MED ORDER — ONDANSETRON HCL 4 MG PO TABS: 4.0000 mg | ORAL_TABLET | Freq: Four times a day (QID) | ORAL | Status: DC | PRN

## 2016-01-20 MED ORDER — LACTATED RINGERS IV SOLN
INTRAVENOUS | Status: DC
  Administered 2016-01-20: 13:00:00 via INTRAVENOUS

## 2016-01-20 MED ORDER — DOCUSATE SODIUM 100 MG PO CAPS
100.0000 mg | ORAL_CAPSULE | Freq: Two times a day (BID) | ORAL | Status: DC
  Administered 2016-01-20 – 2016-01-22 (×5): 100 mg via ORAL
  Filled 2016-01-20 (×5): qty 1

## 2016-01-20 MED ORDER — METHOCARBAMOL 1000 MG/10ML IJ SOLN
500.0000 mg | Freq: Four times a day (QID) | INTRAVENOUS | Status: DC | PRN
  Filled 2016-01-20: qty 5

## 2016-01-20 MED ORDER — CEFAZOLIN IN D5W 1 GM/50ML IV SOLN
1.0000 g | Freq: Four times a day (QID) | INTRAVENOUS | Status: DC
  Filled 2016-01-20 (×2): qty 50

## 2016-01-20 MED ORDER — ACETAMINOPHEN 325 MG PO TABS
650.0000 mg | ORAL_TABLET | Freq: Four times a day (QID) | ORAL | Status: DC | PRN
Start: 2016-01-20 — End: 2016-01-22

## 2016-01-20 MED ORDER — KETOROLAC TROMETHAMINE 15 MG/ML IJ SOLN
7.5000 mg | Freq: Four times a day (QID) | INTRAMUSCULAR | Status: AC
  Administered 2016-01-20 – 2016-01-21 (×4): 7.5 mg via INTRAVENOUS
  Filled 2016-01-20 (×3): qty 1

## 2016-01-20 MED ORDER — PHENYLEPHRINE 40 MCG/ML (10ML) SYRINGE FOR IV PUSH (FOR BLOOD PRESSURE SUPPORT)
PREFILLED_SYRINGE | INTRAVENOUS | Status: AC
  Filled 2016-01-20: qty 10

## 2016-01-20 MED ORDER — PHENYLEPHRINE HCL 10 MG/ML IJ SOLN
INTRAMUSCULAR | Status: AC
  Filled 2016-01-20: qty 2

## 2016-01-20 MED ORDER — BUPIVACAINE IN DEXTROSE 0.75-8.25 % IT SOLN
INTRATHECAL | Status: DC | PRN
  Administered 2016-01-20: 2 mL via INTRATHECAL

## 2016-01-20 MED ORDER — CEFAZOLIN SODIUM-DEXTROSE 2-4 GM/100ML-% IV SOLN
2.0000 g | INTRAVENOUS | Status: AC
  Administered 2016-01-20: 2 g via INTRAVENOUS
  Filled 2016-01-20: qty 100

## 2016-01-20 MED ORDER — FENTANYL CITRATE (PF) 100 MCG/2ML IJ SOLN: 25.0000 ug | INTRAMUSCULAR | Status: DC | PRN

## 2016-01-20 MED ORDER — PHENYLEPHRINE HCL 10 MG/ML IJ SOLN
INTRAMUSCULAR | Status: DC | PRN
  Administered 2016-01-20: 40 ug via INTRAVENOUS

## 2016-01-20 MED ORDER — METOCLOPRAMIDE HCL 5 MG/ML IJ SOLN: 5.0000 mg | Freq: Three times a day (TID) | INTRAMUSCULAR | Status: DC | PRN

## 2016-01-20 MED ORDER — POVIDONE-IODINE 10 % EX SWAB: 2.0000 "application " | Freq: Once | CUTANEOUS | Status: DC

## 2016-01-20 MED ORDER — PROPOFOL 500 MG/50ML IV EMUL
INTRAVENOUS | Status: DC | PRN
  Administered 2016-01-20: 50 ug/kg/min via INTRAVENOUS

## 2016-01-20 MED ORDER — ONDANSETRON HCL 4 MG PO TABS: 4.0000 mg | ORAL_TABLET | Freq: Three times a day (TID) | ORAL | 0 refills | Status: DC | PRN

## 2016-01-20 MED ORDER — TRANEXAMIC ACID 1000 MG/10ML IV SOLN
1000.0000 mg | INTRAVENOUS | Status: AC
  Administered 2016-01-20: 1000 mg via INTRAVENOUS
  Filled 2016-01-20: qty 10

## 2016-01-20 MED ORDER — METHOCARBAMOL 500 MG PO TABS
500.0000 mg | ORAL_TABLET | Freq: Four times a day (QID) | ORAL | Status: DC | PRN
  Administered 2016-01-20 – 2016-01-21 (×2): 500 mg via ORAL
  Filled 2016-01-20: qty 1

## 2016-01-20 MED ORDER — ENOXAPARIN SODIUM 40 MG/0.4ML ~~LOC~~ SOLN: 40.0000 mg | SUBCUTANEOUS | 0 refills | Status: DC

## 2016-01-20 MED ORDER — MIDAZOLAM HCL 2 MG/2ML IJ SOLN
INTRAMUSCULAR | Status: AC
  Filled 2016-01-20: qty 2

## 2016-01-20 MED ORDER — HYDROCODONE-ACETAMINOPHEN 5-325 MG PO TABS: 1.0000 | ORAL_TABLET | ORAL | 0 refills | Status: DC | PRN

## 2016-01-20 MED ORDER — BUPIVACAINE HCL (PF) 0.5 % IJ SOLN
INTRAMUSCULAR | Status: DC | PRN
  Administered 2016-01-20: 30 mL

## 2016-01-20 MED ORDER — ONDANSETRON HCL 4 MG/2ML IJ SOLN
INTRAMUSCULAR | Status: DC | PRN
  Administered 2016-01-20: 4 mg via INTRAVENOUS

## 2016-01-20 MED ORDER — BACLOFEN 10 MG PO TABS: 10.0000 mg | ORAL_TABLET | Freq: Three times a day (TID) | ORAL | 0 refills | Status: DC | PRN

## 2016-01-20 MED ORDER — LACTATED RINGERS IV SOLN
INTRAVENOUS | Status: DC
  Administered 2016-01-20 (×2): via INTRAVENOUS

## 2016-01-20 MED ORDER — ONDANSETRON HCL 4 MG/2ML IJ SOLN: 4.0000 mg | Freq: Four times a day (QID) | INTRAMUSCULAR | Status: DC | PRN

## 2016-01-20 MED ORDER — CEFAZOLIN IN D5W 1 GM/50ML IV SOLN
1.0000 g | Freq: Four times a day (QID) | INTRAVENOUS | Status: AC
  Administered 2016-01-20 (×2): 1 g via INTRAVENOUS
  Filled 2016-01-20 (×2): qty 50

## 2016-01-20 MED ORDER — PHENYLEPHRINE HCL 10 MG/ML IJ SOLN
INTRAMUSCULAR | Status: DC | PRN
  Administered 2016-01-20: 30 ug/min via INTRAVENOUS

## 2016-01-20 SURGICAL SUPPLY — 47 items
BIT DRILL 7/64X5 DISP (BIT) ×3 IMPLANT
BLADE SAGITTAL 25.0X1.27X90 (BLADE) ×2 IMPLANT
BLADE SAGITTAL 25.0X1.27X90MM (BLADE) ×1
CAPT HIP HEMI 2 ×3 IMPLANT
CLOSURE STERI-STRIP 1/2X4 (GAUZE/BANDAGES/DRESSINGS) ×2
CLOSURE WOUND 1/2 X4 (GAUZE/BANDAGES/DRESSINGS) ×1
CLSR STERI-STRIP ANTIMIC 1/2X4 (GAUZE/BANDAGES/DRESSINGS) ×4 IMPLANT
COVER SURGICAL LIGHT HANDLE (MISCELLANEOUS) ×3 IMPLANT
DRAPE ORTHO SPLIT 77X108 STRL (DRAPES) ×4
DRAPE SURG ORHT 6 SPLT 77X108 (DRAPES) ×2 IMPLANT
DRAPE U-SHAPE 47X51 STRL (DRAPES) ×3 IMPLANT
DRSG MEPILEX BORDER 4X8 (GAUZE/BANDAGES/DRESSINGS) ×3 IMPLANT
DURAPREP 26ML APPLICATOR (WOUND CARE) ×3 IMPLANT
ELECT BLADE 4.0 EZ CLEAN MEGAD (MISCELLANEOUS) ×3
ELECT CAUTERY BLADE 6.4 (BLADE) ×3 IMPLANT
ELECT REM PT RETURN 9FT ADLT (ELECTROSURGICAL) ×3
ELECTRODE BLDE 4.0 EZ CLN MEGD (MISCELLANEOUS) ×1 IMPLANT
ELECTRODE REM PT RTRN 9FT ADLT (ELECTROSURGICAL) ×1 IMPLANT
FACESHIELD WRAPAROUND (MASK) ×3 IMPLANT
GLOVE BIO SURGEON STRL SZ7.5 (GLOVE) ×6 IMPLANT
GLOVE BIOGEL PI IND STRL 8 (GLOVE) ×2 IMPLANT
GLOVE BIOGEL PI INDICATOR 8 (GLOVE) ×4
GOWN STRL REUS W/ TWL LRG LVL3 (GOWN DISPOSABLE) ×2 IMPLANT
GOWN STRL REUS W/ TWL XL LVL3 (GOWN DISPOSABLE) ×1 IMPLANT
GOWN STRL REUS W/TWL LRG LVL3 (GOWN DISPOSABLE) ×4
GOWN STRL REUS W/TWL XL LVL3 (GOWN DISPOSABLE) ×2
HIP CAPITATED HEMI 2 ×1 IMPLANT
KIT BASIN OR (CUSTOM PROCEDURE TRAY) ×3 IMPLANT
KIT ROOM TURNOVER OR (KITS) ×3 IMPLANT
MANIFOLD NEPTUNE II (INSTRUMENTS) ×3 IMPLANT
NEEDLE 22X1 1/2 (OR ONLY) (NEEDLE) ×3 IMPLANT
NS IRRIG 1000ML POUR BTL (IV SOLUTION) ×3 IMPLANT
PACK TOTAL JOINT (CUSTOM PROCEDURE TRAY) ×3 IMPLANT
PAD ARMBOARD 7.5X6 YLW CONV (MISCELLANEOUS) ×6 IMPLANT
PILLOW ABDUCTION HIP (SOFTGOODS) ×3 IMPLANT
RETRIEVER SUT HEWSON (MISCELLANEOUS) ×3 IMPLANT
STRIP CLOSURE SKIN 1/2X4 (GAUZE/BANDAGES/DRESSINGS) ×2 IMPLANT
SUT FIBERWIRE #2 38 REV NDL BL (SUTURE) ×6
SUT MNCRL AB 4-0 PS2 18 (SUTURE) ×3 IMPLANT
SUT MON AB 2-0 CT1 36 (SUTURE) ×3 IMPLANT
SUT VIC AB 1 CT1 27 (SUTURE) ×2
SUT VIC AB 1 CT1 27XBRD ANBCTR (SUTURE) ×1 IMPLANT
SUTURE FIBERWR#2 38 REV NDL BL (SUTURE) ×2 IMPLANT
SYRINGE 20CC LL (MISCELLANEOUS) ×3 IMPLANT
TOWEL OR 17X24 6PK STRL BLUE (TOWEL DISPOSABLE) ×3 IMPLANT
TOWEL OR 17X26 10 PK STRL BLUE (TOWEL DISPOSABLE) ×3 IMPLANT
TRAY FOLEY CATH 14FR (SET/KITS/TRAYS/PACK) IMPLANT

## 2016-01-20 NOTE — Anesthesia Procedure Notes (Signed)
Spinal  Staffing Anesthesiologist: Suzette Battiest Performed: anesthesiologist  Preanesthetic Checklist Completed: patient identified, site marked, surgical consent, pre-op evaluation, timeout performed, IV checked, risks and benefits discussed and monitors and equipment checked Spinal Block Patient position: right lateral decubitus Prep: site prepped and draped and DuraPrep Patient monitoring: heart rate, continuous pulse ox and blood pressure Approach: midline Location: L4-5 Injection technique: single-shot Needle Needle type: Pencan  Needle gauge: 24 G Needle length: 9 cm

## 2016-01-20 NOTE — Progress Notes (Signed)
Pt. Arrived to Short Stay wearing 3 gold colored rings. One ring with clear stone and another with small clear stones in it. [place], spoke to [employer] taking care of pt. Instructed her to send someone to pick up pt's rings. Beth Gauldin, NT picked up rings.

## 2016-01-20 NOTE — Progress Notes (Addendum)
Patient ID: Amber [name], female   DOB: [birth year], 82 y.o.   MRN: [id number]  PROGRESS NOTE    Amber [name]  [id number] DOB: 11/12/1932 DOA: 01/19/2016  PCP: Heywood Bene, PA-C   Brief Narrative:  82 year old female with past medical history of hypertension, hyperlipidemia, hypothyroidism, recent fall 2 weeks PTA (fell onto her left side) who presented status post fall onto the right hip. She reported right hip pain. Patient also reported that she has been having dizzy spells in the last 3-4 months prior to this admission since her BP medication has been changed by her PCP.   In ED, BP was 161/90, good oxygen saturation. Right hip x-ray showed acute flatus angulated closed from cervical fracture of the right femur    Assessment & Plan:   Principal Problem:   Closed right hip fracture (HCC) due to mechanical fall - Plan for surgery today - Continue pain management efforts  - NPO prior to surgery  Active Problems:  Chronic Dizziness  - Unclear etiology - Obtain PT eval    Hypertension, essential  - Continue Hydralazine PRN     Hypothyroidism - Continue synthroid     Hyperlipidemia - Continue statin therapy     DVT prophylaxis: Heparin subQ Code Status: DNR/DNI Family Communication: No family at the bedside this am Disposition Plan: surgery today    Consultants:   Orthopedic surgery   Procedures:   Plan for surgery today   Antimicrobials:   Preop cefazolin 01/20/2016   Subjective: No overnight events.   Objective: Vitals:   01/19/16 2013 01/19/16 2250 01/19/16 2329 01/20/16 0517  BP: (!) 158/62 (!) 146/63 (!) 154/64 (!) 143/62  Pulse: 82 78 73 77  Resp: 17 16 16 17   Temp: 98.8 F (37.1 C) 98.6 F (37 C) 98.5 F (36.9 C) 97.9 F (36.6 C)  TempSrc: Oral Oral Oral Oral  SpO2: 100% 100% 97% 100%  Weight:   68 kg (150 lb)   Height:   5\' 8"  (1.727 m)     Intake/Output Summary (Last 24 hours) at 01/20/16 0926 Last data filed at  01/20/16 0700  Gross per 24 hour  Intake                0 ml  Output              400 ml  Net             -400 ml   Filed Weights   01/19/16 2329  Weight: 68 kg (150 lb)    Examination:  General exam: Appears calm and comfortable  Respiratory system: Clear to auscultation. Respiratory effort normal. Cardiovascular system: S1 & S2 heard, RRR. No pedal edema. Gastrointestinal system: Abdomen is nondistended, soft and nontender. No organomegaly or masses felt. Normal bowel sounds heard. Central nervous system: Alert and oriented. No focal neurological deficits. Extremities: no tenderness, palpable pulses  Skin: No rashes, lesions or ulcers Psychiatry: Judgement and insight appear normal. Mood & affect appropriate.   Data Reviewed: I have personally reviewed following labs and imaging studies  CBC:  Recent Labs Lab 01/19/16 1455  WBC 8.2  NEUTROABS 7.1  HGB 13.1  HCT 38.9  MCV 90.9  PLT 0000000   Basic Metabolic Panel:  Recent Labs Lab 01/19/16 1455  NA 140  K 3.6  CL 106  CO2 26  GLUCOSE 92  BUN 16  CREATININE 0.77  CALCIUM 8.9   GFR: Estimated Creatinine Clearance: 55.6 mL/min (by C-G formula  based on SCr of 0.77 mg/dL). Liver Function Tests: No results for input(s): AST, ALT, ALKPHOS, BILITOT, PROT, ALBUMIN in the last 168 hours. No results for input(s): LIPASE, AMYLASE in the last 168 hours. No results for input(s): AMMONIA in the last 168 hours. Coagulation Profile:  Recent Labs Lab 01/19/16 1455  INR 1.06   Cardiac Enzymes: No results for input(s): CKTOTAL, CKMB, CKMBINDEX, TROPONINI in the last 168 hours. BNP (last 3 results) No results for input(s): PROBNP in the last 8760 hours. HbA1C: No results for input(s): HGBA1C in the last 72 hours. CBG: No results for input(s): GLUCAP in the last 168 hours. Lipid Profile: No results for input(s): CHOL, HDL, LDLCALC, TRIG, CHOLHDL, LDLDIRECT in the last 72 hours. Thyroid Function Tests: No results for  input(s): TSH, T4TOTAL, FREET4, T3FREE, THYROIDAB in the last 72 hours. Anemia Panel: No results for input(s): VITAMINB12, FOLATE, FERRITIN, TIBC, IRON, RETICCTPCT in the last 72 hours. Urine analysis:    Component Value Date/Time   COLORURINE YELLOW 03/26/2013 1537   APPEARANCEUR CLEAR 03/26/2013 1537   LABSPEC 1.041 (H) 03/26/2013 1537   PHURINE 6.5 03/26/2013 1537   GLUCOSEU NEGATIVE 03/26/2013 1537   HGBUR TRACE (A) 03/26/2013 1537   BILIRUBINUR NEGATIVE 03/26/2013 1537   KETONESUR NEGATIVE 03/26/2013 1537   PROTEINUR 30 (A) 03/26/2013 1537   UROBILINOGEN 1.0 03/26/2013 1537   NITRITE NEGATIVE 03/26/2013 1537   LEUKOCYTESUR NEGATIVE 03/26/2013 1537   Sepsis Labs: @LABRCNTIP (procalcitonin:4,lacticidven:4)   )No results found for this or any previous visit (from the past 240 hour(s)).    Radiology Studies: Dg Chest 2 View Result Date: 01/19/2016 No acute disease. Atherosclerosis. Electronically Signed   By: Inge Rise M.D.   On: 01/19/2016 15:26   Dg Hip Unilat  With Pelvis 2-3 Views Right Result Date: 01/19/2016 Acute, varus angulated, closed transcervical fracture of the right femur. Dystrophic calcifications in the soft tissues adjacent to the left proximal femur which may be related to old remote trauma and possibly myositis or potentially from chronic iatrogenic injection related granulomas. These appear chronic and stable Electronically Signed   By: Ashley Royalty M.D.   On: 01/19/2016 14:11     Scheduled Meds: .  heparin  5,000 Units Subcutaneous Q8H  .  levothyroxine  75 mcg Oral QAC breakfast  .  pravastatin  20 mg Oral q1800  .  senna  1 tablet Oral BID   Continuous Infusions: . sodium chloride    . lactated ringers 50 mL/hr at 01/20/16 0821     LOS: 1 day    Time spent: 25 minutes  Greater than 50% of the time spent on counseling and coordinating the care.   Leisa Lenz, MD [facility] Pager [phone]  If 7PM-7AM, please contact  night-coverage [email or web address] Password [email or web address] 01/20/2016, 9:26 AM

## 2016-01-20 NOTE — Consult Note (Signed)
ORTHOPAEDIC CONSULTATION  REQUESTING PHYSICIAN: Ripudeep Krystal Eaton, MD  Chief Complaint: Right hip pain after mechanical fall from ground  Assessment: Principal Problem:   Closed right hip fracture [facility]) Active Problems:   Dizziness   Fall   Hypertension   Hypothyroidism   Hyperlipidemia   Hip fracture ([place])   Plan: Nothing by mouth Right hip hemiarthroplasty by Dr. Alain Marion this morning. Weight Bearing Status: Nonweightbearing. Will update postoperatively. VTE px: SCD's and heparin. Likely ASA or Lovenox for 30 days postoperatively.  The risks benefits and alternatives of the procedure were discussed with the patient including but not limited to infection, bleeding, nerve injury, the need for revision surgery, blood clots, cardiopulmonary complications, morbidity, mortality, among others.  The patient verbalizes understanding and wishes to proceed.    HPI: Amber [name] is a 82 y.o. female who complains of  right hip and groin pain after fall yesterday. She was shaking a rug and fell over onto her porch. She notes some increased dizziness lately after blood pressure medication changes. However, she denies hitting her head or losing consciousness. She denies chest pain or shortness of breath. Last meal yesterday. No known allergies. Previously ambulatory living at home with her 74 year old husband.  No history of DVT/PE.  XR shows Acute, varus angulated, closed transcervical fracture of the right femur.  Orthopedics was consulted for evaluation.    Past Medical History:  Diagnosis Date  . Complication of anesthesia    "woke up jumping and bouncing"  . Hyperlipidemia   . Hypertension   . 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 Thyroid disease    Past Surgical History:  Procedure Laterality Date  . ABDOMINAL HYSTERECTOMY    . CHOLECYSTECTOMY N/A 03/27/2013   Procedure: LAPAROSCOPIC CHOLECYSTECTOMY WITH INTRAOPERATIVE CHOLANGIOGRAM;  Surgeon: Pedro Earls, MD;  Location: [facility];  Service: General;  Laterality: N/A;  . KNEE SURGERY Left    approx 4 surgeries on left knee   Social History   Social History  . Marital status: Married    Spouse name: N/A  . Number of children: N/A  . Years of education: N/A   Social History Main Topics  . Smoking status: Former Smoker    Quit date: 03/27/1983  . Smokeless tobacco: Never Used  . Alcohol use No  . Drug use: No  . Sexual activity: Not Asked   Other Topics Concern  . None   Social History Narrative  . None   Family History  Problem Relation Age of Onset  . Lung cancer Sister   . Bone cancer Brother   . Stomach cancer Sister    No Known Allergies Prior to Admission medications   Medication Sig Start Date End Date Taking? Authorizing Provider  aspirin 81 MG tablet Take 81 mg by mouth daily.   Yes Historical Provider, MD  benazepril (LOTENSIN) 20 MG tablet Take 20 mg by mouth daily. 11/16/15  Yes Historical Provider, MD  levothyroxine (SYNTHROID, LEVOTHROID) 75 MCG tablet Take 75 mcg by mouth daily before breakfast.   Yes Historical Provider, MD  lovastatin (MEVACOR) 20 MG tablet Take 20 mg by mouth at bedtime. 11/17/15  Yes Historical Provider, MD  Multiple Vitamins-Calcium (VIACTIV MULTI-VITAMIN) CHEW Chew 1 tablet by mouth every evening.   Yes Historical Provider, MD  naproxen sodium (ANAPROX) 220 MG tablet Take 220 mg by mouth 2 (two) times daily with a meal.   Yes Historical Provider,  MD  acetaminophen (TYLENOL) 325 MG tablet Take 2 tablets (650 mg total) by mouth every 4 (four) hours as needed for mild pain (Do not take more than 4000 mg of Tylenol (acetaminophen) per day.  This is in the prescription pain medicine.). Patient not taking: Reported on 01/19/2016 03/28/13   Earnstine Regal, PA-C  diphenhydrAMINE (BENADRYL) 25 MG tablet Take 1 tablet (25 mg total) by mouth every 6 (six) hours. Take as directed for 3 days then as needed for persistent symptoms of hives Patient not  taking: Reported on 01/19/2016 08/13/14   Charlann Lange, PA-C  famotidine (PEPCID) 20 MG tablet Take 1 tablet (20 mg total) by mouth 2 (two) times daily. Take twice daily for 3 days then take as needed Patient not taking: Reported on 01/19/2016 08/13/14   Charlann Lange, PA-C  ibuprofen (ADVIL,MOTRIN) 200 MG tablet You can take 2-3 tablets every 6 hours as needed for pain.  Use this first then the prescription medicine, if you need something more. Patient not taking: Reported on 01/19/2016 03/28/13   Earnstine Regal, PA-C   Dg Chest 2 View  Result Date: 01/19/2016 CLINICAL DATA:  Trip and fall today. EXAM: CHEST  2 VIEW COMPARISON:  PA and lateral chest 03/26/2014. FINDINGS: Lungs are clear. Heart size is normal. No pneumothorax or pleural effusion. Aortic atherosclerosis noted. No acute bony abnormality. IMPRESSION: No acute disease. Atherosclerosis. Electronically Signed   By: Inge Rise M.D.   On: 01/19/2016 15:26   Dg Hip Unilat  With Pelvis 2-3 Views Right  Result Date: 01/19/2016 CLINICAL DATA:  Right hip pain after fall earlier today. Unable to bear weight. EXAM: DG HIP (WITH OR WITHOUT PELVIS) 2-3V RIGHT COMPARISON:  CT from 03/26/2013 FINDINGS: There is an acute, closed, transcervical fracture through the mid right femoral neck with resultant varus angulation of the proximal right femur. No malalignment of the hip joint. The bony pelvis and left hip appear intact. Soft tissue calcifications are seen along the lateral left thigh possibly from old remote trauma. Patchy bony demineralization is seen about both femora. IMPRESSION: Acute, varus angulated, closed transcervical fracture of the right femur. Dystrophic calcifications in the soft tissues adjacent to the left proximal femur which may be related to old remote trauma and possibly myositis or potentially from chronic iatrogenic injection related granulomas. These appear chronic and stable Electronically Signed   By: Ashley Royalty M.D.   On:  01/19/2016 14:11    Positive ROS: All other systems have been reviewed and were otherwise negative with the exception of those mentioned in the HPI and as above.  Objective: Labs cbc  Recent Labs  01/19/16 1455  WBC 8.2  HGB 13.1  HCT 38.9  PLT 181    Labs coag  Recent Labs  01/19/16 1455  INR 1.06     Recent Labs  01/19/16 1455  NA 140  K 3.6  CL 106  CO2 26  GLUCOSE 92  BUN 16  CREATININE 0.77  CALCIUM 8.9    Physical Exam: Vitals:   01/19/16 2329 01/20/16 0517  BP: (!) 154/64 (!) 143/62  Pulse: 73 77  Resp: 16 17  Temp: 98.5 F (36.9 C) 97.9 F (36.6 C)   General: Alert, no acute distress Mental status: Alert and Oriented x3 Neurologic: Speech Clear and organized, no gross focal findings or movement disorder appreciated. Respiratory: No cyanosis, no use of accessory musculature Cardiovascular: No pedal edema GI: Abdomen is soft and non-tender, non-distended. Skin: Warm and dry.  No  lesions in the area of chief complaint Extremities: Warm and well perfused w/o edema Psychiatric: Patient is competent for consent with normal mood and affect MUSCULOSKELETAL:  Right hip without lesion or ecchymosis. Pain with range of motion. Neurovascularly intact. Distal sensation intact. Other extremities are atraumatic with painless ROM and NVI.   Prudencio Burly III PA-C 01/20/2016 6:10 AM

## 2016-01-20 NOTE — Op Note (Signed)
01/19/2016 - 01/20/2016  9:46 AM  PATIENT:  Amber [name]   MRN: [id number]  PRE-OPERATIVE DIAGNOSIS:  right hip fracture  POST-OPERATIVE DIAGNOSIS:  right hip fracture  PROCEDURE:  Procedure(s): ARTHROPLASTY BIPOLAR HIP (HEMIARTHROPLASTY)  PREOPERATIVE INDICATIONS:  DARREN NODAL is an 82 y.o. female who was admitted 01/19/2016 with a diagnosis of Closed right hip fracture ([place]) and elected for surgical management.  The risks benefits and alternatives were discussed with the patient including but not limited to the risks of nonoperative treatment, versus surgical intervention including infection, bleeding, nerve injury, periprosthetic fracture, the need for revision surgery, dislocation, leg length discrepancy, blood clots, cardiopulmonary complications, morbidity, mortality, among others, and they were willing to proceed.  Predicted outcome is good, although there will be at least a six to nine month expected recovery.   OPERATIVE REPORT     SURGEON:  Edmonia Lynch, MD    ASSISTANT:  Roxan Hockey, PA-C, he was present and scrubbed throughout the case, critical for completion in a timely fashion, and for retraction, instrumentation, and closure.     ANESTHESIA:  General    COMPLICATIONS:  None.      COMPONENTS:  [employer] A[employer]ade: Femoral stem: 3, Femoral Head:48, Neck:0   PROCEDURE IN DETAIL: The patient was met in the holding area and identified.  The appropriate hip  was marked at the operative site. The patient was then transported to the OR and  placed under general anesthesia.  At that point, the patient was  placed in the lateral decubitus position with the operative side up and  secured to the operating room table and all bony prominences padded.     The operative lower extremity was prepped from the iliac crest to the toes.  Sterile draping was performed.  Time out was performed prior to incision.      A routine posterolateral approach was utilized via sharp dissection   carried down to the subcutaneous tissue.  Gross bleeders were Bovie  coagulated.  The iliotibial band was identified and incised  along the length of the skin incision.  Self-retaining retractors were  inserted. I examined the bursa there was significant hematoma and edema I performed a bursectomy here.  With the hip internally rotated, the short external rotators  were identified. The piriformis was tagged with FiberWire, and the hip capsule released in a T-type fashion.  The femoral neck was exposed, and I resected the femoral neck using the appropriate jig. This was performed at approximately a thumb's breadth above the lesser trochanter.    I then exposed the deep acetabulum, cleared out any tissue including the ligamentum teres, and included the hip capsule in the FiberWire used above and below the T.    I then prepared the proximal femur using the cookie-cutter, the lateralizing reamer, and then sequentially broached.  A trial utilized, and I reduced the hip and it was found to have excellent stability with functional range of motion. The trial components were then removed.   The canal and acetabulum were thoroughly irrigated  I inserted the pressfit stem and placed the head and neck collar. The hip was reduced with appropriate force and was stable through a range of motion.   I then used a 2 mm drill bits to pass the FiberWire suture from the capsule and puriform is through the greater trochanter, and secured this. Excellent posterior capsular repair was achieved. I also closed the T in the capsule.  I then irrigated the hip copiously again with  pulse lavage, and repaired the fascia with Vicryl, followed by Vicryl for the subcutaneous tissue, Monocryl for the skin, Steri-Strips and sterile gauze. The wounds were injected. The patient was then awakened and returned to PACU in stable and satisfactory condition. There were no complications.  POST-OP PLAN: Weight bearing as tolerated. DVT px  will consist of SCD's and chemical px   , MD Orthopedic Surgeon [phone]   01/20/2016 9:46 AM  

## 2016-01-20 NOTE — H&P (View-Only) (Signed)
**Note De-Identified Amber Obfuscation** ORTHOPAEDIC CONSULTATION  REQUESTING PHYSICIAN: Ripudeep Krystal Eaton, MD  Chief Complaint: Right hip pain after mechanical fall from ground  Assessment: Principal Problem:   Closed right hip fracture [facility]) Active Problems:   Dizziness   Fall   Hypertension   Hypothyroidism   Hyperlipidemia   Hip fracture ([place])   Plan: Nothing by mouth Right hip hemiarthroplasty by Dr. Alain Marion this morning. Weight Bearing Status: Nonweightbearing. Will update postoperatively. VTE px: SCD's and heparin. Likely ASA or Lovenox for 30 days postoperatively.  The risks benefits and alternatives of the procedure were discussed with the patient including but not limited to infection, bleeding, nerve injury, the need for revision surgery, blood clots, cardiopulmonary complications, morbidity, mortality, among others.  The patient verbalizes understanding and wishes to proceed.    HPI: Amber [name] is a 82 y.o. female who complains of  right hip and groin pain after fall yesterday. She was shaking a rug and fell over onto her porch. She notes some increased dizziness lately after blood pressure medication changes. However, she denies hitting her head or losing consciousness. She denies chest pain or shortness of breath. Last meal yesterday. No known allergies. Previously ambulatory living at home with her 42 year old husband.  No history of DVT/PE.  XR shows Acute, varus angulated, closed transcervical fracture of the right femur.  Orthopedics was consulted for evaluation.    Past Medical History:  Diagnosis Date  . Complication of anesthesia    "woke up jumping and bouncing"  . Hyperlipidemia   . Hypertension   . 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 Thyroid disease    Past Surgical History:  Procedure Laterality Date  . ABDOMINAL HYSTERECTOMY    . CHOLECYSTECTOMY N/A 03/27/2013   Procedure: LAPAROSCOPIC CHOLECYSTECTOMY WITH INTRAOPERATIVE CHOLANGIOGRAM;  Surgeon: Pedro Earls, MD;  Location: [facility] ORS;  Service: General;  Laterality: N/A;  . KNEE SURGERY Left    approx 4 surgeries on left knee   Social History   Social History  . Marital status: Married    Spouse name: N/A  . Number of children: N/A  . Years of education: N/A   Social History Main Topics  . Smoking status: Former Smoker    Quit date: 03/27/1983  . Smokeless tobacco: Never Used  . Alcohol use No  . Drug use: No  . Sexual activity: Not Asked   Other Topics Concern  . None   Social History Narrative  . None   Family History  Problem Relation Age of Onset  . Lung cancer Sister   . Bone cancer Brother   . Stomach cancer Sister    No Known Allergies Prior to Admission medications   Medication Sig Start Date End Date Taking? Authorizing Provider  aspirin 81 MG tablet Take 81 mg by mouth daily.   Yes Historical Provider, MD  benazepril (LOTENSIN) 20 MG tablet Take 20 mg by mouth daily. 11/16/15  Yes Historical Provider, MD  levothyroxine (SYNTHROID, LEVOTHROID) 75 MCG tablet Take 75 mcg by mouth daily before breakfast.   Yes Historical Provider, MD  lovastatin (MEVACOR) 20 MG tablet Take 20 mg by mouth at bedtime. 11/17/15  Yes Historical Provider, MD  Multiple Vitamins-Calcium (VIACTIV MULTI-VITAMIN) CHEW Chew 1 tablet by mouth every evening.   Yes Historical Provider, MD  naproxen sodium (ANAPROX) 220 MG tablet Take 220 mg by mouth 2 (two) times daily with a meal.   Yes Historical Provider,  MD  acetaminophen (TYLENOL) 325 MG tablet Take 2 tablets (650 mg total) by mouth every 4 (four) hours as needed for mild pain (Do not take more than 4000 mg of Tylenol (acetaminophen) per day.  This is in the prescription pain medicine.). Patient not taking: Reported on 01/19/2016 03/28/13   Earnstine Regal, PA-C  diphenhydrAMINE (BENADRYL) 25 MG tablet Take 1 tablet (25 mg total) by mouth every 6 (six) hours. Take as directed for 3 days then as needed for persistent symptoms of hives Patient not  taking: Reported on 01/19/2016 08/13/14   Charlann Lange, PA-C  famotidine (PEPCID) 20 MG tablet Take 1 tablet (20 mg total) by mouth 2 (two) times daily. Take twice daily for 3 days then take as needed Patient not taking: Reported on 01/19/2016 08/13/14   Charlann Lange, PA-C  ibuprofen (ADVIL,MOTRIN) 200 MG tablet You can take 2-3 tablets every 6 hours as needed for pain.  Use this first then the prescription medicine, if you need something more. Patient not taking: Reported on 01/19/2016 03/28/13   Earnstine Regal, PA-C   Dg Chest 2 View  Result Date: 01/19/2016 CLINICAL DATA:  Trip and fall today. EXAM: CHEST  2 VIEW COMPARISON:  PA and lateral chest 03/26/2014. FINDINGS: Lungs are clear. Heart size is normal. No pneumothorax or pleural effusion. Aortic atherosclerosis noted. No acute bony abnormality. IMPRESSION: No acute disease. Atherosclerosis. Electronically Signed   By: Inge Rise M.D.   On: 01/19/2016 15:26   Dg Hip Unilat  With Pelvis 2-3 Views Right  Result Date: 01/19/2016 CLINICAL DATA:  Right hip pain after fall earlier today. Unable to bear weight. EXAM: DG HIP (WITH OR WITHOUT PELVIS) 2-3V RIGHT COMPARISON:  CT from 03/26/2013 FINDINGS: There is an acute, closed, transcervical fracture through the mid right femoral neck with resultant varus angulation of the proximal right femur. No malalignment of the hip joint. The bony pelvis and left hip appear intact. Soft tissue calcifications are seen along the lateral left thigh possibly from old remote trauma. Patchy bony demineralization is seen about both femora. IMPRESSION: Acute, varus angulated, closed transcervical fracture of the right femur. Dystrophic calcifications in the soft tissues adjacent to the left proximal femur which may be related to old remote trauma and possibly myositis or potentially from chronic iatrogenic injection related granulomas. These appear chronic and stable Electronically Signed   By: Ashley Royalty M.D.   On:  01/19/2016 14:11    Positive ROS: All other systems have been reviewed and were otherwise negative with the exception of those mentioned in the HPI and as above.  Objective: Labs cbc  Recent Labs  01/19/16 1455  WBC 8.2  HGB 13.1  HCT 38.9  PLT 181    Labs coag  Recent Labs  01/19/16 1455  INR 1.06     Recent Labs  01/19/16 1455  NA 140  K 3.6  CL 106  CO2 26  GLUCOSE 92  BUN 16  CREATININE 0.77  CALCIUM 8.9    Physical Exam: Vitals:   01/19/16 2329 01/20/16 0517  BP: (!) 154/64 (!) 143/62  Pulse: 73 77  Resp: 16 17  Temp: 98.5 F (36.9 C) 97.9 F (36.6 C)   General: Alert, no acute distress Mental status: Alert and Oriented x3 Neurologic: Speech Clear and organized, no gross focal findings or movement disorder appreciated. Respiratory: No cyanosis, no use of accessory musculature Cardiovascular: No pedal edema GI: Abdomen is soft and non-tender, non-distended. Skin: Warm and dry.  No  lesions in the area of chief complaint Extremities: Warm and well perfused w/o edema Psychiatric: Patient is competent for consent with normal mood and affect MUSCULOSKELETAL:  Right hip without lesion or ecchymosis. Pain with range of motion. Neurovascularly intact. Distal sensation intact. Other extremities are atraumatic with painless ROM and NVI.   Prudencio Burly III PA-C 01/20/2016 6:10 AM

## 2016-01-20 NOTE — Anesthesia Postprocedure Evaluation (Signed)
Anesthesia Post Note  Patient: Amber [name]  Procedure(s) Performed: Procedure(s) (LRB): ARTHROPLASTY BIPOLAR HIP (HEMIARTHROPLASTY) (Right)  Patient location during evaluation: PACU Anesthesia Type: Spinal Level of consciousness: oriented and awake and alert Pain management: pain level controlled Vital Signs Assessment: post-procedure vital signs reviewed and stable Respiratory status: spontaneous breathing, respiratory function stable and patient connected to nasal cannula oxygen Cardiovascular status: blood pressure returned to baseline and stable Postop Assessment: no headache and no backache Anesthetic complications: no    Last Vitals:  Vitals:   01/20/16 1154 01/20/16 1217  BP: 135/65 (!) 150/66  Pulse: 66 68  Resp: 14 16  Temp: 36.6 C 36.6 C    Last Pain:  Vitals:   01/20/16 1453  TempSrc:   PainSc: 2                  Amber [name]

## 2016-01-20 NOTE — Transfer of Care (Signed)
Immediate Anesthesia Transfer of Care Note  Patient: Amber [name]  Procedure(s) Performed: Procedure(s): ARTHROPLASTY BIPOLAR HIP (HEMIARTHROPLASTY) (Right)  Patient Location: PACU  Anesthesia Type:MAC and Regional  Level of Consciousness: awake, alert  and oriented  Airway & Oxygen Therapy: Patient Spontanous Breathing  Post-op Assessment: Report given to RN and Post -op Vital signs reviewed and stable  Post vital signs: Reviewed and stable  Last Vitals:  Vitals:   01/19/16 2329 01/20/16 0517  BP: (!) 154/64 (!) 143/62  Pulse: 73 77  Resp: 16 17  Temp: 36.9 C 36.6 C    Last Pain:  Vitals:   01/20/16 0600  TempSrc:   PainSc: 6       Patients Stated Pain Goal: 2 ([id number])  Complications: No apparent anesthesia complications

## 2016-01-20 NOTE — Anesthesia Preprocedure Evaluation (Signed)
Anesthesia Evaluation  Patient identified by MRN, date of birth, ID band Patient awake    Reviewed: Allergy & Precautions, NPO status , Patient's Chart, lab work & pertinent test results  Airway Mallampati: II  TM Distance: >3 FB Neck ROM: Full    Dental  (+) Dental Advisory Given   Pulmonary former smoker,    breath sounds clear to auscultation       Cardiovascular hypertension, Pt. on medications  Rhythm:Regular Rate:Normal     Neuro/Psych negative neurological ROS     GI/Hepatic negative GI ROS, Neg liver ROS,   Endo/Other  Hypothyroidism   Renal/GU negative Renal ROS     Musculoskeletal   Abdominal   Peds  Hematology negative hematology ROS (+)   Anesthesia Other Findings   Reproductive/Obstetrics                             Lab Results  Component Value Date   WBC 8.2 01/19/2016   HGB 13.1 01/19/2016   HCT 38.9 01/19/2016   MCV 90.9 01/19/2016   PLT 181 01/19/2016   Lab Results  Component Value Date   CREATININE 0.77 01/19/2016   BUN 16 01/19/2016   NA 140 01/19/2016   K 3.6 01/19/2016   CL 106 01/19/2016   CO2 26 01/19/2016   Lab Results  Component Value Date   INR 1.06 01/19/2016   INR 1.34 03/27/2013    Anesthesia Physical Anesthesia Plan  ASA: II  Anesthesia Plan: MAC and Spinal   Post-op Pain Management:    Induction: Intravenous  Airway Management Planned: Simple Face Mask  Additional Equipment:   Intra-op Plan:   Post-operative Plan:   Informed Consent: I have reviewed the patients History and Physical, chart, labs and discussed the procedure including the risks, benefits and alternatives for the proposed anesthesia with the patient or authorized representative who has indicated his/her understanding and acceptance.     Plan Discussed with: CRNA  Anesthesia Plan Comments:         Anesthesia Quick Evaluation

## 2016-01-20 NOTE — Interval H&P Note (Signed)
History and Physical Interval Note:  01/20/2016 7:48 AM  Amber [name]  has presented today for surgery, with the diagnosis of right hip fracture  The various methods of treatment have been discussed with the patient and family. After consideration of risks, benefits and other options for treatment, the patient has consented to  Procedure(s): ARTHROPLASTY BIPOLAR HIP (HEMIARTHROPLASTY) (Right) as a surgical intervention .  The patient's history has been reviewed, patient examined, no change in status, stable for surgery.  I have reviewed the patient's chart and labs.  Questions were answered to the patient's satisfaction.     [name]

## 2016-01-21 DIAGNOSIS — W19XXXS Unspecified fall, sequela: Secondary | ICD-10-CM

## 2016-01-21 DIAGNOSIS — E038 Other specified hypothyroidism: Secondary | ICD-10-CM

## 2016-01-21 LAB — BASIC METABOLIC PANEL
ANION GAP: 8 (ref 5–15)
BUN: 7 mg/dL (ref 6–20)
CALCIUM: 8.3 mg/dL — AB (ref 8.9–10.3)
CO2: 26 mmol/L (ref 22–32)
Chloride: 105 mmol/L (ref 101–111)
Creatinine, Ser: 0.82 mg/dL (ref 0.44–1.00)
Glucose, Bld: 99 mg/dL (ref 65–99)
POTASSIUM: 3.7 mmol/L (ref 3.5–5.1)
SODIUM: 139 mmol/L (ref 135–145)

## 2016-01-21 LAB — CBC
HCT: 33.6 % — ABNORMAL LOW (ref 36.0–46.0)
Hemoglobin: 11.3 g/dL — ABNORMAL LOW (ref 12.0–15.0)
MCH: 30.2 pg (ref 26.0–34.0)
MCHC: 33.6 g/dL (ref 30.0–36.0)
MCV: 89.8 fL (ref 78.0–100.0)
PLATELETS: 146 10*3/uL — AB (ref 150–400)
RBC: 3.74 MIL/uL — AB (ref 3.87–5.11)
RDW: 13.3 % (ref 11.5–15.5)
WBC: 5.4 10*3/uL (ref 4.0–10.5)

## 2016-01-21 NOTE — Progress Notes (Addendum)
Patient ID: Amber [name], female   DOB: [birth year], 82 y.o.   MRN: [id number]  PROGRESS NOTE    Amber [name]  [id number] DOB: Jul 03, 1932 DOA: 01/19/2016  PCP: Heywood Bene, PA-C   Brief Narrative:  82 year old female with past medical history of hypertension, hyperlipidemia, hypothyroidism, recent fall 2 weeks PTA (fell onto her left side) who presented status post fall onto the right hip. She reported right hip pain. Patient also reported that she has been having dizzy spells in the last 3-4 months prior to this admission since her BP medication has been changed by her PCP.   In ED, BP was 161/90, good oxygen saturation. Right hip x-ray showed acute flatus angulated closed from cervical fracture of the right femur.  Assessment & Plan:   Principal Problem:   Closed right hip fracture ([place]) due to mechanical fall - S/P hemiarthroplasty 01/20/2016 - Continue pain management efforts  - Per PT - HH PT ordered  Active Problems:   Acute blood loss anemia - Hb down from 13.1 to 11.3 this am - Follow up CBC in am    Thrombocytopenia - Platelet drop noted from 181 --> 146 - Stop heparin  - Check CBC in am - No gross bleed    Chronic Dizziness  - Per PT - HH recommended, orders placed     Hypertension, essential  - BP stable 131/65    Hypothyroidism - Continue synthroid     Hyperlipidemia - Continue statin therapy    DVT prophylaxis: Heparin subQ stopped due to drop in platelets, use SCD's instead  Code Status: DNR/DNI Family Communication: No family at the bedside this am Disposition Plan: home with [facility] in am if hgb stable    Consultants:   Orthopedic surgery   Procedures:   Hip Hemiarthroplasty 01/20/2016   Antimicrobials:   Preop cefazolin 01/20/2016   Subjective: No overnight events.   Objective: Vitals:   01/20/16 1954 01/21/16 0041 01/21/16 0500 01/21/16 1212  BP: (!) 141/63 (!) 146/57 (!) 143/64 131/65  Pulse: 76 84 81 71  Resp: 16 17  16 16   Temp: 98.5 F (36.9 C) 99.9 F (37.7 C) 99.4 F (37.4 C) 98.6 F (37 C)  TempSrc: Oral Oral Oral Oral  SpO2: 98% 96% 94% 98%  Weight:      Height:        Intake/Output Summary (Last 24 hours) at 01/21/16 1310 Last data filed at 01/21/16 0900  Gross per 24 hour  Intake             1345 ml  Output             2200 ml  Net             -855 ml   Filed Weights   01/19/16 2329  Weight: 68 kg (150 lb)    Examination:  General exam: Appears calm and comfortable, no distress  Respiratory system: No wheezing, no rhonchi  Cardiovascular system: S1 & S2 heard, Rate controlled  Gastrointestinal system: (+) BS, non tender  Central nervous system: No focal neurological deficits. Extremities: no tenderness, palpable pulses bilaterally  Skin: warm and dry  Psychiatry: Mood & affect appropriate.   Data Reviewed: I have personally reviewed following labs and imaging studies  CBC:  Recent Labs Lab 01/19/16 1455 01/21/16 0438  WBC 8.2 5.4  NEUTROABS 7.1  --   HGB 13.1 11.3*  HCT 38.9 33.6*  MCV 90.9 89.8  PLT 181 146*   Basic  Metabolic Panel:  Recent Labs Lab 01/19/16 1455 01/21/16 0438  NA 140 139  K 3.6 3.7  CL 106 105  CO2 26 26  GLUCOSE 92 99  BUN 16 7  CREATININE 0.77 0.82  CALCIUM 8.9 8.3*   GFR: Estimated Creatinine Clearance: 54.3 mL/min (by C-G formula based on SCr of 0.82 mg/dL). Liver Function Tests: No results for input(s): AST, ALT, ALKPHOS, BILITOT, PROT, ALBUMIN in the last 168 hours. No results for input(s): LIPASE, AMYLASE in the last 168 hours. No results for input(s): AMMONIA in the last 168 hours. Coagulation Profile:  Recent Labs Lab 01/19/16 1455  INR 1.06   Cardiac Enzymes: No results for input(s): CKTOTAL, CKMB, CKMBINDEX, TROPONINI in the last 168 hours. BNP (last 3 results) No results for input(s): PROBNP in the last 8760 hours. HbA1C: No results for input(s): HGBA1C in the last 72 hours. CBG: No results for input(s):  GLUCAP in the last 168 hours. Lipid Profile: No results for input(s): CHOL, HDL, LDLCALC, TRIG, CHOLHDL, LDLDIRECT in the last 72 hours. Thyroid Function Tests:  Recent Labs  01/20/16 1219  TSH 1.770   Anemia Panel: No results for input(s): VITAMINB12, FOLATE, FERRITIN, TIBC, IRON, RETICCTPCT in the last 72 hours. Urine analysis:    Component Value Date/Time   COLORURINE YELLOW 03/26/2013 1537   APPEARANCEUR CLEAR 03/26/2013 1537   LABSPEC 1.041 (H) 03/26/2013 1537   PHURINE 6.5 03/26/2013 1537   GLUCOSEU NEGATIVE 03/26/2013 1537   HGBUR TRACE (A) 03/26/2013 1537   BILIRUBINUR NEGATIVE 03/26/2013 1537   KETONESUR NEGATIVE 03/26/2013 1537   PROTEINUR 30 (A) 03/26/2013 1537   UROBILINOGEN 1.0 03/26/2013 1537   NITRITE NEGATIVE 03/26/2013 1537   LEUKOCYTESUR NEGATIVE 03/26/2013 1537   Sepsis Labs: @LABRCNTIP (procalcitonin:4,lacticidven:4)   )No results found for this or any previous visit (from the past 240 hour(s)).    Radiology Studies: Dg Chest 2 View Result Date: 01/19/2016 No acute disease. Atherosclerosis. Electronically Signed   By: Inge Rise M.D.   On: 01/19/2016 15:26   Dg Hip Unilat  With Pelvis 2-3 Views Right Result Date: 01/19/2016 Acute, varus angulated, closed transcervical fracture of the right femur. Dystrophic calcifications in the soft tissues adjacent to the left proximal femur which may be related to old remote trauma and possibly myositis or potentially from chronic iatrogenic injection related granulomas. These appear chronic and stable Electronically Signed   By: Ashley Royalty M.D.   On: 01/19/2016 14:11     Scheduled Meds: .  heparin  5,000 Units Subcutaneous Q8H  .  levothyroxine  75 mcg Oral QAC breakfast  .  pravastatin  20 mg Oral q1800  .  senna  1 tablet Oral BID   Continuous Infusions: . lactated ringers 75 mL/hr at 01/20/16 2033     LOS: 2 days    Time spent: 25 minutes  Greater than 50% of the time spent on counseling and  coordinating the care.   Leisa Lenz, MD [facility] Pager [phone]  If 7PM-7AM, please contact night-coverage [email or web address] Password [email or web address] 01/21/2016, 1:10 PM

## 2016-01-21 NOTE — Progress Notes (Signed)
   Assessment: 1 Day Post-Op  S/P Procedure(s) (LRB): ARTHROPLASTY BIPOLAR HIP (HEMIARTHROPLASTY) (Right) by Dr. Ernesta Amble. Murphy on 01/20/16  Principal Problem:   Closed right hip fracture [facility]) Active Problems:   Dizziness   Fall   Hypertension   Hypothyroidism   Hyperlipidemia   Hip fracture ([place]) Acute Blood loss anemia - likely with dilutional component.    Doing well from an orthopedics perspective.  PT Eval pending.  She would like to go home tomorrow if possible.  Plan: Up with therapy D/C IV fluids Posterior hip precautions reviewed.  Weight Bearing: Weight Bearing as Tolerated (WBAT) right leg Dressings: prn.  VTE prophylaxis: Lovenox, SCDs, ambulation Dispo: Pending PT evaluation.  The patient is hoping to go home.  Subjective: Patient reports pain as mild. Pain controlled with PO meds.  Tolerating diet.  Urinating.  +Flatus.  No CP, SOB.  Not yet OOB.  Objective:   VITALS:   Vitals:   01/20/16 1217 01/20/16 1954 01/21/16 0041 01/21/16 0500  BP: (!) 150/66 (!) 141/63 (!) 146/57 (!) 143/64  Pulse: 68 76 84 81  Resp: 16 16 17 16   Temp: 97.9 F (36.6 C) 98.5 F (36.9 C) 99.9 F (37.7 C) 99.4 F (37.4 C)  TempSrc:  Oral Oral Oral  SpO2: 98% 98% 96% 94%  Weight:      Height:       CBC Latest Ref Rng & Units 01/21/2016 01/19/2016 03/28/2013  WBC 4.0 - 10.5 K/uL 5.4 8.2 8.9  Hemoglobin 12.0 - 15.0 g/dL 11.3(L) 13.1 11.0(L)  Hematocrit 36.0 - 46.0 % 33.6(L) 38.9 32.6(L)  Platelets 150 - 400 K/uL 146(L) 181 112(L)   BMP Latest Ref Rng & Units 01/21/2016 01/19/2016 03/28/2013  Glucose 65 - 99 mg/dL 99 92 135(H)  BUN 6 - 20 mg/dL 7 16 16   Creatinine 0.44 - 1.00 mg/dL 0.82 0.77 0.88  Sodium 135 - 145 mmol/L 139 140 137  Potassium 3.5 - 5.1 mmol/L 3.7 3.6 3.6(L)  Chloride 101 - 111 mmol/L 105 106 101  CO2 22 - 32 mmol/L 26 26 25   Calcium 8.9 - 10.3 mg/dL 8.3(L) 8.9 8.1(L)   Intake/Output      12/07 0701 - 12/08 0700 12/08 0701 - 12/09 0700   P.O. 50    I.V. (mL/kg) 2305 (33.9)    IV Piggyback 50    Total Intake(mL/kg) 2405 (35.4)    Urine (mL/kg/hr) 2725 (1.7)    Blood 150 (0.1)    Total Output 2875     Net -470            Physical Exam: General: NAD.  Upright in bed Resp: No increased wob Cardio: regular rate and rhythm ABD soft Neurologically intact MSK Neurovascularly intact Sensation intact distally Feet warm Dorsiflexion/Plantar flexion intact Incision: dressing C/D/I   Amber [name] 01/21/2016, 7:22 AM

## 2016-01-21 NOTE — Evaluation (Signed)
Physical Therapy Evaluation Patient Details Name: Amber [name] MRN: [id number] DOB: October 27, 1932 Today's Date: 01/21/2016   History of Present Illness  82 yo female admitted on 01/19/16 through ED following fall resulting in right hip fx. Pt underwent a hemiarthroplasty on 01/20/16 and is now udner posterior hip precautions. Pt had two falls over past two weeks following a change in her HTN medication. 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    Clinical Impression  Pt is POD 1 and moving well with therapy. Prior to admission, pt lived with husband in two story home and was completely independent including performing housekeeping and driving to run errands. Pt feels her husband will be able to minimally assist upon discharge and they may have neighbors or friends available to assist. Pt presents with the above diagnoses and below deficits which warrant her to continue to be seen acutely. Pt will benefit from HHPT upon discharge in order to assist with safe return to PLOF.     Follow Up Recommendations Home health PT    Equipment Recommendations  Rolling [place] with 5" wheels;3in1 (PT)    Recommendations for Other Services       Precautions / Restrictions Precautions Precautions: Posterior Hip Precaution Booklet Issued: Yes (comment) Precaution Comments: handout given and reviewed Restrictions Weight Bearing Restrictions: Yes RLE Weight Bearing: Weight bearing as tolerated      Mobility  Bed Mobility Overal bed mobility: Needs Assistance Bed Mobility: Supine to Sit     Supine to sit: Min assist;HOB elevated     General bed mobility comments: min a to bring RLE EOB and requires cues to maintain hip precautions once EOB  Transfers Overall transfer level: Needs assistance Equipment used: Rolling S[place]cki (2 wheeled) Transfers: Sit to/from Stand Sit to Stand: Min assist         General transfer comment: Min A for safety from EOB with mod cues to decrease hip flexion upon  standing in order to stay within hip prcautions  Ambulation/Gait Ambulation/Gait assistance: Min assist Ambulation Distance (Feet): 50 Feet Assistive device: Rolling S[place] (2 wheeled) Gait Pattern/deviations: Step-through pattern;Decreased step length - left;Decreased stance time - right;Antalgic Gait velocity: decreased Gait velocity interpretation: Below normal speed for age/gender General Gait Details: mild antalgic gait with cues for proper sequencing and requires cues for foot placement into ER when turning toward chair to avoid pivoting  Stairs            Wheelchair Mobility    Modified Rankin (Stroke Patients Only)       Balance Overall balance assessment: Needs assistance Sitting-balance support: Feet supported;No upper extremity supported Sitting balance-Leahy Scale: Good Sitting balance - Comments: EOB with no back support   Standing balance support: Bilateral upper extremity supported Standing balance-Leahy Scale: Poor Standing balance comment: relies on RW for stability in standing                             Pertinent Vitals/Pain Pain Assessment: 0-10 Pain Score: 5  Pain Location: right hip Pain Descriptors / Indicators: Aching;Discomfort;Guarding Pain Intervention(s): Monitored during session;Premedicated before session;Ice applied;Repositioned    Home Living Family/patient expects to be discharged to:: Private residence Living Arrangements: Spouse/significant other Available Help at Discharge: Family;Home health;Available 24 hours/day Type of Home: House Home Access: Stairs to enter Entrance Stairs-Rails: None Entrance Stairs-Number of Steps: 1 Home Layout: Two level;Able to live on main level with bedroom/bathroom Home Equipment: Gilford Rile - 2 wheels;Cane - single point;Grab  bars - toilet      Prior Function Level of Independence: Independent         Comments: was independent working around the house including driving and running  errands.      Hand Dominance   Dominant Hand: Right    Extremity/Trunk Assessment   Upper Extremity Assessment: Defer to OT evaluation           Lower Extremity Assessment: RLE deficits/detail RLE Deficits / Details: Pt presents with normal post op pain and weakness. At least 3/5 ankle and 2/5 knee and hip per gross functional assessment       Communication   Communication: No difficulties  Cognition Arousal/Alertness: Awake/alert Behavior During Therapy: WFL for tasks assessed/performed Overall Cognitive Status: Within Functional Limits for tasks assessed                      General Comments      Exercises Total Joint Exercises Ankle Circles/Pumps: AROM;Both;20 reps;Supine   Assessment/Plan    PT Assessment Patient needs continued PT services  PT Problem List Decreased strength;Decreased range of motion;Decreased activity tolerance;Decreased balance;Decreased mobility;Decreased knowledge of use of DME;Pain          PT Treatment Interventions DME instruction;Gait training;Stair training;Functional mobility training;Therapeutic activities;Therapeutic exercise;Balance training;Patient/family education    PT Goals (Current goals can be found in the Care Plan section)  Acute Rehab PT Goals Patient Stated Goal: to get home PT Goal Formulation: With patient Time For Goal Achievement: 01/28/16 Potential to Achieve Goals: Good    Frequency Min 5X/week   Barriers to discharge        Co-evaluation               End of Session Equipment Utilized During Treatment: Gait belt Activity Tolerance: Patient tolerated treatment well;Patient limited by fatigue Patient left: in chair;with call bell/phone within reach;with nursing/sitter in room Nurse Communication: Mobility status         Time: 0917-1002 PT Time Calculation (min) (ACUTE ONLY): 45 min   Charges:   PT Evaluation $PT Eval Low Complexity: 1 Procedure PT Treatments $Gait Training:  8-22 mins $Therapeutic Activity: 8-22 mins   PT G Codes:        Scheryl Marten PT, DPT  [phone]  01/21/2016, 11:12 AM

## 2016-01-21 NOTE — Progress Notes (Signed)
Physical Therapy Treatment Patient Details Name: Amber [name] MRN: [id number] DOB: 13-Dec-1932 Today's Date: 01/21/2016    History of Present Illness 82 yo female admitted on 01/19/16 through ED following fall resulting in right hip fx. Pt underwent a hemiarthroplasty on 01/20/16 and is now udner posterior hip precautions. Pt had two falls over past two weeks following a change in her HTN medication. 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      PT Comments    Pt has just returned to bed this afternoon, but is agreeable to supine exercises. Guided pt through supine LE ROM and strengthening exercises in order to improve strength and mobility. Reviewed THA posterior precautions with pt and family. Pt would like to return home with her husband. Will need to review stairs next session prior to DC.  Follow Up Recommendations  Home health PT     Equipment Recommendations  Rolling [place] with 5" wheels;3in1 (PT)    Recommendations for Other Services       Precautions / Restrictions Precautions Precautions: Posterior Hip Precaution Booklet Issued: Yes (comment) Precaution Comments: handout given and reviewed Restrictions Weight Bearing Restrictions: Yes RLE Weight Bearing: Weight bearing as tolerated    Mobility  Bed Mobility Overal bed mobility: Needs Assistance Bed Mobility: Supine to Sit     Supine to sit: Min assist;HOB elevated     General bed mobility comments: min a to bring RLE EOB and requires cues to maintain hip precautions once EOB  Transfers Overall transfer level: Needs assistance Equipment used: Rolling Vicens (2 wheeled) Transfers: Sit to/from Stand Sit to Stand: Min assist         General transfer comment: Min A for safety from EOB with mod cues to decrease hip flexion upon standing in order to stay within hip prcautions  Ambulation/Gait Ambulation/Gait assistance: Min assist Ambulation Distance (Feet): 50 Feet Assistive device: Rolling Bachtel (2  wheeled) Gait Pattern/deviations: Step-through pattern;Decreased step length - left;Decreased stance time - right;Antalgic Gait velocity: decreased Gait velocity interpretation: Below normal speed for age/gender General Gait Details: mild antalgic gait with cues for proper sequencing and requires cues for foot placement into ER when turning toward chair to avoid pivoting   Stairs            Wheelchair Mobility    Modified Rankin (Stroke Patients Only)       Balance Overall balance assessment: Needs assistance Sitting-balance support: Feet supported;No upper extremity supported Sitting balance-Leahy Scale: Good Sitting balance - Comments: EOB with no back support   Standing balance support: Bilateral upper extremity supported Standing balance-Leahy Scale: Poor Standing balance comment: relies on RW for stability in standing                    Cognition Arousal/Alertness: Awake/alert Behavior During Therapy: WFL for tasks assessed/performed Overall Cognitive Status: Within Functional Limits for tasks assessed                      Exercises Total Joint Exercises Ankle Circles/Pumps: AROM;Both;20 reps;Supine Quad Sets: AROM;Right;10 reps;Supine Short Arc Quad: AROM;Right;10 reps;Supine Heel Slides: AROM;Right;10 reps;Supine Hip ABduction/ADduction: AROM;Right;10 reps;Supine    General Comments        Pertinent Vitals/Pain Pain Assessment: 0-10 Pain Score: 4  Pain Location: right hip Pain Descriptors / Indicators: Aching;Discomfort;Guarding Pain Intervention(s): Monitored during session;Ice applied    Home Living  Prior Function            PT Goals (current goals can now be found in the care plan section) Acute Rehab PT Goals Patient Stated Goal: to get home PT Goal Formulation: With patient Time For Goal Achievement: 01/28/16 Potential to Achieve Goals: Good Progress towards PT goals: Progressing toward  goals    Frequency    Min 5X/week      PT Plan Current plan remains appropriate    Co-evaluation             End of Session Equipment Utilized During Treatment: Gait belt Activity Tolerance: Patient tolerated treatment well Patient left: in bed;with call bell/phone within reach;with family/visitor present     Time: 1326-1340 PT Time Calculation (min) (ACUTE ONLY): 14 min  Charges:  $Gait Training: 8-22 mins $Therapeutic Exercise: 8-22 mins $Therapeutic Activity: 8-22 mins                    G Codes:      Scheryl Marten PT, DPT  [phone]  01/21/2016, 2:08 PM

## 2016-01-21 NOTE — Care Management Note (Addendum)
Case Management Note  Patient Details  Name: Amber [name] MRN: [id number] Date of Birth: 10-06-1932  Subjective/Objective:   82 yr old female s/p right hip hemiarthroplasty.                 Action/Plan: Case manaler spoke with patient,her husband and daughter, concerning [place] and DME needs. Choice was offered for [place] referral was called to [place], [facility] Liaison. Patient states she has a rolling Arizmendi, will need 3in1. Case manager has ordered. Patient will have assistance from her husband at discharge, their daughter lives in [place].    Expected Discharge Date:  01/22/16               Expected Discharge Plan:  [place]  In-House Referral:  NA  Discharge planning Services  CM Consult  Post Acute Care Choice:  Durable Medical Equipment, Home Health Choice offered to:  Patient, Spouse  DME Arranged:  3-N-1 DME Agency:  [place]:  PT [place]: [place]  Status of Service:  Completed, signed off  If discussed at [place] of Stay Meetings, dates discussed:    Additional Comments:  Ninfa Meeker, RN 01/21/2016, 11:54 AM

## 2016-01-22 LAB — CBC
HCT: 31.7 % — ABNORMAL LOW (ref 36.0–46.0)
Hemoglobin: 10.7 g/dL — ABNORMAL LOW (ref 12.0–15.0)
MCH: 30.6 pg (ref 26.0–34.0)
MCHC: 33.8 g/dL (ref 30.0–36.0)
MCV: 90.6 fL (ref 78.0–100.0)
PLATELETS: 142 10*3/uL — AB (ref 150–400)
RBC: 3.5 MIL/uL — AB (ref 3.87–5.11)
RDW: 13.6 % (ref 11.5–15.5)
WBC: 6 10*3/uL (ref 4.0–10.5)

## 2016-01-22 MED ORDER — ACETAMINOPHEN 325 MG PO TABS: 650.0000 mg | ORAL_TABLET | Freq: Four times a day (QID) | ORAL | 0 refills | Status: DC | PRN

## 2016-01-22 MED ORDER — DOCUSATE SODIUM 100 MG PO CAPS: 100.0000 mg | ORAL_CAPSULE | Freq: Every day | ORAL | 0 refills | Status: DC | PRN

## 2016-01-22 NOTE — Progress Notes (Signed)
Physical Therapy Treatment Patient Details Name: Amber [name] MRN: [id number] DOB: 1933-02-07 Today's Date: 01/22/2016    History of Present Illness 82 y.o. female admitted on 01/19/16 through ED following fall resulting in right hip fx. Pt underwent a hemiarthroplasty on 01/20/16 and is now udner posterior hip precautions. Pt had two falls over past two weeks following a change in her HTN medication. PMH significant for LLE sarcoma, HTN, hyperlipidemia, thyroid disease.    PT Comments    Pt's family is with her this PM session. Performed gait training with Rw. Pt requires cues for maintaining RLE extended during sit to stand from EOB to stay within precautions and requires cues for recall of 1/3 precautions this session. Discussed with pt, family, and OT and it was decided that SNF placement might be the best option at this time as pt lives with her husband who is 54 and will not be able to assist the way pt is at this time. Pt and family in agreement. RN notified and CSW notified of change in status.   Follow Up Recommendations  SNF     Equipment Recommendations  Rolling Backs with 5" wheels;3in1 (PT)    Recommendations for Other Services       Precautions / Restrictions Precautions Precautions: Posterior Hip Precaution Booklet Issued: Yes (comment) Precaution Comments: revieed given handout Restrictions Weight Bearing Restrictions: Yes RLE Weight Bearing: Weight bearing as tolerated    Mobility  Bed Mobility Overal bed mobility: Needs Assistance Bed Mobility: Supine to Sit;Sit to Supine     Supine to sit: Min assist;HOB elevated Sit to supine: Mod assist   General bed mobility comments: assist with LEs.  Transfers Overall transfer level: Needs assistance Equipment used: Rolling [place] (2 wheeled) Transfers: Sit to/from Stand Sit to Stand: Min assist;From elevated surface         General transfer comment: Min A for safety from EOB with surface elevated and cues  to maintain hip precations  Ambulation/Gait Ambulation/Gait assistance: Min assist Ambulation Distance (Feet): 100 Feet Assistive device: Rolling Gorr (2 wheeled) Gait Pattern/deviations: Step-through pattern;Decreased step length - left;Decreased stance time - right;Antalgic Gait velocity: decreased Gait velocity interpretation: Below normal speed for age/gender General Gait Details: mild antalgic gait with cues for proper sequencing and requires cues for foot placement into ER when turning toward chair to avoid pivoting   Stairs            Wheelchair Mobility    Modified Rankin (Stroke Patients Only)       Balance Overall balance assessment: Needs assistance Sitting-balance support: Feet supported;No upper extremity supported Sitting balance-Leahy Scale: Good Sitting balance - Comments: EOB with no back support                            Cognition Arousal/Alertness: Awake/alert Behavior During Therapy: WFL for tasks assessed/performed Overall Cognitive Status: Within Functional Limits for tasks assessed                      Exercises Total Joint Exercises Ankle Circles/Pumps: AROM;Both;20 reps;Supine Heel Slides: AROM;Right;10 reps;Supine Hip ABduction/ADduction: AROM;Right;10 reps;Supine Long Arc Quad: AROM;Right;10 reps;Seated    General Comments        Pertinent Vitals/Pain Pain Assessment: 0-10 Pain Score: 6  Pain Location: RLE with movement Pain Descriptors / Indicators: Discomfort;Sore;Operative site guarding Pain Intervention(s): Monitored during session;Ice applied    Home Living Family/patient expects to be discharged to:: Unsure Living Arrangements:  Spouse/significant other Available Help at Discharge: Family;Available 24 hours/day (spouse is 34) Type of Home: House Home Access: Stairs to enter Entrance Stairs-Rails: None Home Layout: Two level;Able to live on main level with bedroom/bathroom Home Equipment: Gilford Rile - 2  wheels;Cane - single point;Grab bars - toilet;Bedside commode;Adaptive equipment (3 in 1 had been delivered)      Prior Function Level of Independence: Independent      Comments: was independent working around the house including driving and running errands.    PT Goals (current goals can now be found in the care plan section) Acute Rehab PT Goals Patient Stated Goal: rehab then home Progress towards PT goals: Progressing toward goals    Frequency    Min 5X/week      PT Plan Current plan remains appropriate    Co-evaluation             End of Session Equipment Utilized During Treatment: Gait belt Activity Tolerance: Patient tolerated treatment well;Patient limited by pain Patient left: in bed;with call bell/phone within reach;with family/visitor present;with bed alarm set;with SCD's reapplied     Time: 1151-1210 PT Time Calculation (min) (ACUTE ONLY): 19 min  Charges:  $Gait Training: 8-22 mins                    G Codes:      Scheryl Marten PT, DPT  [phone]  01/22/2016, 12:47 PM

## 2016-01-22 NOTE — Progress Notes (Signed)
Patient's daughter is at bedside, she want to be informed regarding to discharge disposition. She lives in [place]. Her name is Talmage Nap, phone is [phone].

## 2016-01-22 NOTE — Progress Notes (Signed)
Attempted to give report on phone to Ritta Slot [phone], gave report to [place], [employer]. fax the discharge summary per SW at [facility] via [phone].Marland Kitchen

## 2016-01-22 NOTE — Progress Notes (Signed)
SPORTS MEDICINE AND JOINT REPLACEMENT  Lara Mulch, MD    Carlyon Shadow, PA-C [place], [place], [place]  [address]                             [phone]   PROGRESS NOTE  Subjective:  negative for Chest Pain  negative for Shortness of Breath  negative for Nausea/Vomiting   negative for Calf Pain  negative for Bowel Movement   Tolerating Diet: yes         Patient reports pain as 4 on 0-10 scale.    Objective: Vital signs in last 24 hours:   Patient Vitals for the past 24 hrs:  BP Temp Temp src Pulse Resp SpO2  01/22/16 0530 (!) 160/69 99.4 F (37.4 C) Oral 85 17 96 %  01/21/16 2002 (!) 149/71 99.7 F (37.6 C) Oral 77 16 97 %  01/21/16 1212 131/65 98.6 F (37 C) Oral 71 16 98 %    @flow {1959:LAST@   Intake/Output from previous day:   12/08 0701 - 12/09 0700 In: 1365 [P.O.:240; I.V.:1125] Out: -    Intake/Output this shift:   No intake/output data recorded.   Intake/Output      12/08 0701 - 12/09 0700 12/09 0701 - 12/10 0700   P.O. 240    I.V. (mL/kg) 1125 (16.5)    IV Piggyback     Total Intake(mL/kg) 1365 (20.1)    Urine (mL/kg/hr)     Blood     Total Output       Net +1365          Urine Occurrence 5 x       LABORATORY DATA:  Recent Labs  01/19/16 1455 01/21/16 0438 01/22/16 0347  WBC 8.2 5.4 6.0  HGB 13.1 11.3* 10.7*  HCT 38.9 33.6* 31.7*  PLT 181 146* 142*    Recent Labs  01/19/16 1455 01/21/16 0438  NA 140 139  K 3.6 3.7  CL 106 105  CO2 26 26  BUN 16 7  CREATININE 0.77 0.82  GLUCOSE 92 99  CALCIUM 8.9 8.3*   Lab Results  Component Value Date   INR 1.06 01/19/2016   INR 1.34 03/27/2013    Examination:  General appearance: alert, cooperative and no distress Extremities: extremities normal, atraumatic, no cyanosis or edema  Wound Exam: clean, dry, intact   Drainage:  None: wound tissue dry  Motor Exam: Quadriceps and Hamstrings Intact  Sensory Exam: Superficial Peroneal, Deep Peroneal and Tibial  normal   Assessment:    2 Days Post-Op  Procedure(s) (LRB): ARTHROPLASTY BIPOLAR HIP (HEMIARTHROPLASTY) (Right)  ADDITIONAL DIAGNOSIS:  Principal Problem:   Closed right hip fracture (HCC) Active Problems:   Dizziness   Fall   Hypertension   Hypothyroidism   Hyperlipidemia   Hip fracture (HCC)     Plan: Physical Therapy as ordered Weight Bearing as Tolerated (WBAT)  DVT Prophylaxis:  Lovenox  DISCHARGE PLAN: Home  DISCHARGE NEEDS: HHPT   Patient is doing well from an orthopedic standpoint. Anticipate D/C home once cleared by medicine         Donia Ast 01/22/2016, 8:07 AM

## 2016-01-22 NOTE — Clinical Social Work Placement (Signed)
   CLINICAL SOCIAL WORK PLACEMENT  NOTE  Date:  01/22/2016  Patient Details  Name: Amber [name] MRN: [id number] Date of Birth: 07/14/1932  Clinical Social Work is seeking post-discharge placement for this patient at the [place] level of care (*CSW will initial, date and re-position this form in  chart as items are completed):  Yes   Patient/family provided with [place] Work Department's list of facilities offering this level of care within the geographic area requested by the patient (or if unable, by the patient's family).  Yes   Patient/family informed of their freedom to choose among providers that offer the needed level of care, that participate in Medicare, Medicaid or managed care program needed by the patient, have an available bed and are willing to accept the patient.  Yes   Patient/family informed of [facility]'s ownership interest in [facility] and [facility], as well as of the fact that they are under no obligation to receive care at these facilities.  PASRR submitted to EDS on 01/22/16     PASRR number received on 01/22/16     Existing PASRR number confirmed on       FL2 transmitted to all facilities in geographic area requested by pt/family on 01/22/16     FL2 transmitted to all facilities within larger geographic area on       Patient informed that his/her managed care company has contracts with or will negotiate with certain facilities, including the following:        Yes   Patient/family informed of bed offers received.  Patient chooses bed at [facility]     Physician recommends and patient chooses bed at      Patient to be transferred to [facility] on 01/22/16.  Patient to be transferred to facility by Ambulance     Patient family notified on 01/22/16 of transfer.  Name of family member notified:  Husband and daughter at bedside     PHYSICIAN Please prepare priority  discharge summary, including medications, Please prepare prescriptions, Please sign FL2, Please sign DNR     Additional Comment:    _______________________________________________ Rigoberto Noel, LCSW 01/22/2016, 3:43 PM

## 2016-01-22 NOTE — Progress Notes (Signed)
Physical Therapy Treatment Patient Details Name: Amber [name] MRN: [id number] DOB: 01-10-1933 Today's Date: 01/22/2016    History of Present Illness 82 yo female admitted on 01/19/16 through ED following fall resulting in right hip fx. Pt underwent a hemiarthroplasty on 01/20/16 and is now udner posterior hip precautions. Pt had two falls over past two weeks following a change in her HTN medication. 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      PT Comments    Pt is POD 2 and limited by pain this session. Performed gait x 100' with increased antalgic pattern as compared to yesterday. Initiated seated LAQ with noticeable discomfort noted. Pt continues to require cues to maintain hip precautions with transitional activities.    Follow Up Recommendations  Home health PT     Equipment Recommendations  Rolling [place] with 5" wheels;3in1 (PT)    Recommendations for Other Services       Precautions / Restrictions Precautions Precautions: Posterior Hip Precaution Booklet Issued: Yes (comment) Precaution Comments: handout given and reviewed Restrictions Weight Bearing Restrictions: Yes RLE Weight Bearing: Weight bearing as tolerated    Mobility  Bed Mobility               General bed mobility comments: OOB when PT arrives  Transfers Overall transfer level: Needs assistance Equipment used: Rolling [place] (2 wheeled) Transfers: Sit to/from Stand Sit to Stand: Min assist         General transfer comment: Min A for safety from EOB with mod cues to decrease hip flexion upon standing in order to stay within hip prcautions  Ambulation/Gait Ambulation/Gait assistance: Min assist Ambulation Distance (Feet): 100 Feet Assistive device: Rolling [place] (2 wheeled) Gait Pattern/deviations: Step-through pattern;Decreased step length - left;Decreased stance time - right;Antalgic Gait velocity: decreased Gait velocity interpretation: Below normal speed for age/gender General Gait  Details: mild antalgic gait with cues for proper sequencing and requires cues for foot placement into ER when turning toward chair to avoid pivoting   Stairs            Wheelchair Mobility    Modified Rankin (Stroke Patients Only)       Balance                                    Cognition Arousal/Alertness: Awake/alert Behavior During Therapy: WFL for tasks assessed/performed Overall Cognitive Status: Within Functional Limits for tasks assessed                      Exercises Total Joint Exercises Long Arc Quad: AROM;Right;10 reps;Seated    General Comments        Pertinent Vitals/Pain Pain Assessment: 0-10 Pain Score: 6  Pain Location: right hip Pain Descriptors / Indicators: Operative site guarding;Guarding;Grimacing Pain Intervention(s): Monitored during session;Limited activity within patient's tolerance;Repositioned;Ice applied    Home Living                      Prior Function            PT Goals (current goals can now be found in the care plan section) Acute Rehab PT Goals Patient Stated Goal: to get home Progress towards PT goals: Progressing toward goals    Frequency    Min 5X/week      PT Plan Current plan remains appropriate    Co-evaluation  End of Session Equipment Utilized During Treatment: Gait belt Activity Tolerance: Patient tolerated treatment well;Patient limited by pain Patient left: in chair;with call bell/phone within reach;with chair alarm set     Time: [phone] PT Time Calculation (min) (ACUTE ONLY): 17 min  Charges:  $Gait Training: 8-22 mins                    G Codes:      Scheryl Marten PT, DPT  [phone]  01/22/2016, 9:21 AM

## 2016-01-22 NOTE — Discharge Summary (Signed)
Physician Discharge Summary  Amber [name] [id number] DOB: 02/10/1933 DOA: 01/19/2016  PCP: Heywood Bene, PA-C  Admit date: 01/19/2016 Discharge date: 01/22/2016  Recommendations for Outpatient Follow-up:  Continue Lovenox per orthopedic surgery for DVT prophylaxis Please recheck CBC in next 1 week to monitor hgb and platelets Hgb and platelets are stable prior to discharge and there is no evidence of acute bleed  Discharge Diagnoses:  Principal Problem:   Closed right hip fracture [facility]) Active Problems:   Dizziness   Fall   Hypertension   Hypothyroidism   Hyperlipidemia   Hip fracture ([place])    Discharge Condition: stable, pt wants to go home today; [place] orders placed   Diet recommendation: as tolerated   History of present illness:  82 year old female with past medical history of hypertension, hyperlipidemia, hypothyroidism, recent fall 2 weeks PTA (fell onto her left side) who presented status post fall onto the right hip. She reported right hip pain. Patient also reported that she has been having dizzy spells in the last 3-4 months prior to this admission since her BP medication has been changed by her PCP.   In ED, BP was 161/90, good oxygen saturation. Right hip x-ray showed acute flatus angulated closed from cervical fracture of the right femur.  Hospital Course:   Assessment & Plan:   Principal Problem: Closed right hip fracture (HCC) due to mechanical fall - S/P hemiarthroplasty 01/20/2016 - Continue pain management efforts  - Per PT - HH PT ordered  Active Problems:   Acute blood loss anemia - Hb down from 13.1 to 11.3 --> 10.7 - No bleeding - Stable for discharge today     Thrombocytopenia - Platelet drop noted from 181 --> 146 - Stopped heparin - No bleeding  - Platelets 142 this am   ChronicDizziness - Per PT - HH recommended, orders placed   Hypertension, essential  - BP stable - Resume home med   Hypothyroidism -  Continue synthroid   Hyperlipidemia - Continue statin therapy    DVT prophylaxis: SCD's bilaterally  Code Status: DNR/DNI Family Communication: No family at the bedside this am    Consultants:   Orthopedic surgery   Procedures:   Hip Hemiarthroplasty 01/20/2016   Antimicrobials:   Preop cefazolin 01/20/2016   Signed:  Leisa Lenz, MD  [facility] 01/22/2016, 9:26 AM  Pager #: [phone]  Time spent in minutes: less than 30 minutes   Discharge Exam: Vitals:   01/21/16 2002 01/22/16 0530  BP: (!) 149/71 (!) 160/69  Pulse: 77 85  Resp: 16 17  Temp: 99.7 F (37.6 C) 99.4 F (37.4 C)   Vitals:   01/21/16 0500 01/21/16 1212 01/21/16 2002 01/22/16 0530  BP: (!) 143/64 131/65 (!) 149/71 (!) 160/69  Pulse: 81 71 77 85  Resp: 16 16 16 17   Temp: 99.4 F (37.4 C) 98.6 F (37 C) 99.7 F (37.6 C) 99.4 F (37.4 C)  TempSrc: Oral Oral Oral Oral  SpO2: 94% 98% 97% 96%  Weight:      Height:        General: Pt is alert, follows commands appropriately, not in acute distress Cardiovascular: Regular rate and rhythm, S1/S2 + Respiratory: Clear to auscultation bilaterally, no wheezing, no crackles, no rhonchi Abdominal: Soft, non tender, non distended, bowel sounds +, no guarding Extremities: no cyanosis, pulses palpable bilaterally DP and PT Neuro: Grossly nonfocal  Discharge Instructions  Discharge Instructions    Call MD for:  persistant nausea and vomiting  Complete by:  As directed    Call MD for:  redness, tenderness, or signs of infection (pain, swelling, redness, odor or green/yellow discharge around incision site)    Complete by:  As directed    Call MD for:  severe uncontrolled pain    Complete by:  As directed    Diet - low sodium heart healthy    Complete by:  As directed    Discharge instructions    Complete by:  As directed    Continue Lovenox per orthopedic surgery for DVT prophylaxis   Increase activity slowly    Complete  by:  As directed        Medication List    STOP taking these medications   aspirin 81 MG tablet   diphenhydrAMINE 25 MG tablet Commonly known as:  BENADRYL   famotidine 20 MG tablet Commonly known as:  PEPCID   ibuprofen 200 MG tablet Commonly known as:  ADVIL,MOTRIN   naproxen sodium 220 MG tablet Commonly known as:  ANAPROX     TAKE these medications   acetaminophen 325 MG tablet Commonly known as:  TYLENOL Take 2 tablets (650 mg total) by mouth every 6 (six) hours as needed for mild pain (or Fever >/= 101). What changed:  when to take this  reasons to take this   baclofen 10 MG tablet Commonly known as:  LIORESAL Take 1 tablet (10 mg total) by mouth 3 (three) times daily as needed for muscle spasms.   benazepril 20 MG tablet Commonly known as:  LOTENSIN Take 20 mg by mouth daily.   docusate sodium 100 MG capsule Commonly known as:  COLACE Take 1 capsule (100 mg total) by mouth daily as needed for mild constipation.   enoxaparin 40 MG/0.4ML injection Commonly known as:  LOVENOX Inject 0.4 mLs (40 mg total) into the skin daily. For 30 days post op for DVT prophylaxis   HYDROcodone-acetaminophen 5-325 MG tablet Commonly known as:  NORCO Take 1-2 tablets by mouth every 4 (four) hours as needed for moderate pain.   levothyroxine 75 MCG tablet Commonly known as:  SYNTHROID, LEVOTHROID Take 75 mcg by mouth daily before breakfast.   lovastatin 20 MG tablet Commonly known as:  MEVACOR Take 20 mg by mouth at bedtime.   ondansetron 4 MG tablet Commonly known as:  ZOFRAN Take 1 tablet (4 mg total) by mouth every 8 (eight) hours as needed for nausea or vomiting.   VIACTIV MULTI-VITAMIN Chew Chew 1 tablet by mouth every evening.            Durable Medical Equipment        Start     Ordered   01/21/16 1149  For home use only DME 3 n 1  Once     01/21/16 1149     Follow-up Information    MURPHY, TIMOTHY D, MD Follow up in 2 week(s).   Specialty:   Orthopedic Surgery Contact information: [address] [place] [address] [phone]        Heywood Bene, PA-C. Schedule an appointment as soon as possible for a visit in 1 week(s).   Specialty:  Physician Assistant Contact information: [address] [place] [address] [phone]            The results of significant diagnostics from this hospitalization (including imaging, microbiology, ancillary and laboratory) are listed below for reference.    Significant Diagnostic Studies: Dg Chest 2 View  Result Date:  01/19/2016 CLINICAL DATA:  Trip and fall today. EXAM: CHEST  2 VIEW COMPARISON:  PA and lateral chest 03/26/2014. FINDINGS: Lungs are clear. Heart size is normal. No pneumothorax or pleural effusion. Aortic atherosclerosis noted. No acute bony abnormality. IMPRESSION: No acute disease. Atherosclerosis. Electronically Signed   By: Inge Rise M.D.   On: 01/19/2016 15:26   Dg Hip Port Unilat With Pelvis 1v Right  Result Date: 01/20/2016 CLINICAL DATA:  Follow-up right hip joint replacement for fracture. EXAM: DG HIP (WITH OR WITHOUT PELVIS) 1V PORT RIGHT COMPARISON:  January 19, 2016 left hip series revealing a right femoral neck fracture. FINDINGS: The prosthetic right hip appears to be in appropriate position. The interface with the native bone is normal. There is gas within the soft tissues over the lateral aspect of the hip. IMPRESSION: The patient has undergone right hip joint replacement without evidence of immediate postprocedure complication Electronically Signed   By: David  Martinique M.D.   On: 01/20/2016 11:39   Dg Hip Unilat  With Pelvis 2-3 Views Right  Result Date: 01/19/2016 CLINICAL DATA:  Right hip pain after fall earlier today. Unable to bear weight. EXAM: DG HIP (WITH OR WITHOUT PELVIS) 2-3V RIGHT COMPARISON:  CT from 03/26/2013 FINDINGS: There is an acute, closed, transcervical fracture through the mid right femoral  neck with resultant varus angulation of the proximal right femur. No malalignment of the hip joint. The bony pelvis and left hip appear intact. Soft tissue calcifications are seen along the lateral left thigh possibly from old remote trauma. Patchy bony demineralization is seen about both femora. IMPRESSION: Acute, varus angulated, closed transcervical fracture of the right femur. Dystrophic calcifications in the soft tissues adjacent to the left proximal femur which may be related to old remote trauma and possibly myositis or potentially from chronic iatrogenic injection related granulomas. These appear chronic and stable Electronically Signed   By: Ashley Royalty M.D.   On: 01/19/2016 14:11    Microbiology: No results found for this or any previous visit (from the past 240 hour(s)).   Labs: Basic Metabolic Panel:  Recent Labs Lab 01/19/16 1455 01/21/16 0438  NA 140 139  K 3.6 3.7  CL 106 105  CO2 26 26  GLUCOSE 92 99  BUN 16 7  CREATININE 0.77 0.82  CALCIUM 8.9 8.3*   Liver Function Tests: No results for input(s): AST, ALT, ALKPHOS, BILITOT, PROT, ALBUMIN in the last 168 hours. No results for input(s): LIPASE, AMYLASE in the last 168 hours. No results for input(s): AMMONIA in the last 168 hours. CBC:  Recent Labs Lab 01/19/16 1455 01/21/16 0438 01/22/16 0347  WBC 8.2 5.4 6.0  NEUTROABS 7.1  --   --   HGB 13.1 11.3* 10.7*  HCT 38.9 33.6* 31.7*  MCV 90.9 89.8 90.6  PLT 181 146* 142*   Cardiac Enzymes: No results for input(s): CKTOTAL, CKMB, CKMBINDEX, TROPONINI in the last 168 hours. BNP: BNP (last 3 results) No results for input(s): BNP in the last 8760 hours.  ProBNP (last 3 results) No results for input(s): PROBNP in the last 8760 hours.  CBG: No results for input(s): GLUCAP in the last 168 hours.

## 2016-01-22 NOTE — Discharge Instructions (Signed)
Hip Fracture A hip fracture is a fracture of the upper part of your thigh bone (femur). What are the causes? A hip fracture is caused by a direct blow to the side of your hip. This is usually the result of a fall but can occur in other circumstances, such as an automobile accident. What increases the risk? There is an increased risk of hip fractures in people with:  An unsteady walking pattern (gait) and those with conditions that contribute to poor balance, such as Parkinson's disease or dementia.  Osteopenia and osteoporosis.  Cancer that spreads to the leg bones.  Certain metabolic diseases. What are the signs or symptoms? Symptoms of hip fracture include:  Pain over the injured hip.  Inability to put weight on the leg in which the fracture occurred (although, some patients are able to walk after a hip fracture).  Toes and foot of the affected leg point outward when you lie down. How is this diagnosed? A physical exam can determine if a hip fracture is likely to have occurred. X-ray exams are needed to confirm the fracture and to look for other injuries. The X-ray exam can help to determine the type of hip fracture. Rarely, the fracture is not visible on an X-ray image and a CT scan or MRI will have to be done. How is this treated? The treatment for a fracture is usually surgery. This means using a screw, nail, or rod to hold the bones in place. Follow these instructions at home: Take all medicines as directed by your health care provider. Contact a health care provider if: Pain continues, even after taking pain medicine. This information is not intended to replace advice given to you by your health care provider. Make sure you discuss any questions you have with your health care provider. Document Released: 01/30/2005 Document Revised: 07/08/2015 Document Reviewed: 09/11/2012 Elsevier Interactive Patient Education  2017 [employer]  Posterior hip precautions Weight bearing  as tolerated. Keep dressing clean and dry

## 2016-01-22 NOTE — NC FL2 (Signed)
[place] LEVEL OF CARE SCREENING TOOL     IDENTIFICATION  Patient Name: Amber [name] Birthdate: May 25, 1932 Sex: female Admission Date (Current Location): 01/19/2016  [facility] and [place] Number:  Herbalist and Address:  [employer]. [facility], [place] [address], [place], [place] [address]      Provider Number: [id number]  Attending Physician Name and Address:  Robbie Lis, MD  Relative Name and Phone Number:       Current Level of Care: Hospital Recommended Level of Care: [place] Prior Approval Number:    Date Approved/Denied:   PASRR Number: [id number]  Discharge Plan: SNF    Current Diagnoses: Patient Active Problem List   Diagnosis Date Noted  . Dizziness 01/19/2016  . Fall 01/19/2016  . Closed right hip fracture ([place]) 01/19/2016  . Hypertension 01/19/2016  . Hypothyroidism 01/19/2016  . Hyperlipidemia 01/19/2016  . Hip fracture ([place]) 01/19/2016  . Status post laparoscopic cholecystectomy 03/27/2013    Orientation RESPIRATION BLADDER Height & Weight     Self, Time, Situation, Place  Normal Continent Weight: 68 kg (150 lb) Height:  5\' 8"  (172.7 cm)  BEHAVIORAL SYMPTOMS/MOOD NEUROLOGICAL BOWEL NUTRITION STATUS   (NONE)  (NONE) Continent  (Regular Diet)  AMBULATORY STATUS COMMUNICATION OF NEEDS Skin   Limited Assist Verbally Surgical wounds (Hip Right)                       Personal Care Assistance Level of Assistance  Bathing, Feeding, Dressing Bathing Assistance: Limited assistance Feeding assistance: Independent Dressing Assistance: Limited assistance     Functional Limitations Info  Hearing, Speech, Sight Sight Info: Adequate Hearing Info: Adequate Speech Info: Adequate    SPECIAL CARE FACTORS FREQUENCY  PT (By licensed PT), OT (By licensed OT)     PT Frequency: 5/week OT Frequency: 5/week            Contractures Contractures Info: Not present    Additional Factors Info   Code Status, Allergies Code Status Info: DNR Allergies Info: NKDA           Current Medications (01/22/2016):  This is the current hospital active medication list Current Facility-Administered Medications  Medication Dose Route Frequency Provider Last Rate Last Dose  . acetaminophen (TYLENOL) tablet 650 mg  650 mg Oral Q6H PRN Charna Elizabeth Martensen III, PA-C       Or  . acetaminophen (TYLENOL) suppository 650 mg  650 mg Rectal Q6H PRN Charna Elizabeth Martensen III, PA-C      . diphenhydrAMINE (BENADRYL) 12.5 MG/5ML elixir 12.5-25 mg  12.5-25 mg Oral Q4H PRN Charna Elizabeth Martensen III, PA-C   25 mg at 01/22/16 0056  . docusate sodium (COLACE) capsule 100 mg  100 mg Oral BID Charna Elizabeth Martensen III, PA-C   100 mg at 01/22/16 [id number]  . HYDROcodone-acetaminophen (NORCO/VICODIN) 5-325 MG per tablet 1-2 tablet  1-2 tablet Oral Q6H PRN Ripudeep Krystal Eaton, MD   1 tablet at 01/21/16 1657  . lactated ringers infusion   Intravenous Continuous Charna Elizabeth Martensen III, PA-C 75 mL/hr at 01/20/16 2033    . levothyroxine (SYNTHROID, LEVOTHROID) tablet 75 mcg  75 mcg Oral QAC breakfast Ripudeep Krystal Eaton, MD   75 mcg at 01/22/16 0936  . methocarbamol (ROBAXIN) tablet 500 mg  500 mg Oral Q6H PRN Charna Elizabeth Martensen III, PA-C   500 mg at 01/21/16 1657   Or  . methocarbamol (ROBAXIN) 500 mg in dextrose 5 %  50 mL IVPB  500 mg Intravenous Q6H PRN Charna Elizabeth Martensen III, PA-C      . metoCLOPramide (REGLAN) tablet 5-10 mg  5-10 mg Oral Q8H PRN Charna Elizabeth Martensen III, PA-C       Or  . metoCLOPramide (REGLAN) injection 5-10 mg  5-10 mg Intravenous Q8H PRN Charna Elizabeth Martensen III, PA-C      . morphine 2 MG/ML injection 0.5 mg  0.5 mg Intravenous Q2H PRN Ripudeep Krystal Eaton, MD   0.5 mg at 01/20/16 0530  . ondansetron (ZOFRAN) tablet 4 mg  4 mg Oral Q6H PRN Charna Elizabeth Martensen III, PA-C       Or  . ondansetron [facility]) injection 4 mg  4 mg Intravenous Q6H PRN Charna Elizabeth Martensen III, PA-C      .  polyethylene glycol (MIRALAX / GLYCOLAX) packet 17 g  17 g Oral Daily PRN Ripudeep Krystal Eaton, MD   17 g at 01/22/16 0937  . pravastatin (PRAVACHOL) tablet 20 mg  20 mg Oral q1800 Ripudeep Krystal Eaton, MD   20 mg at 01/21/16 1657  . senna (SENOKOT) tablet 8.6 mg  1 tablet Oral BID Ripudeep Krystal Eaton, MD   8.6 mg at 01/22/16 [id number]     Discharge Medications: Please see discharge summary for a list of discharge medications.  Relevant Imaging Results:  Relevant Lab Results:   Additional Information SSN: [id number]  Rigoberto Noel, LCSW

## 2016-01-22 NOTE — Evaluation (Addendum)
Occupational Therapy Evaluation Patient Details Name: Amber [name] MRN: [id number] DOB: 1932/08/02 Today's Date: 01/22/2016    History of Present Illness 82 y.o. female admitted on 01/19/16 through ED following fall resulting in right hip fx. Pt underwent a hemiarthroplasty on 01/20/16 and is now udner posterior hip precautions. Pt had two falls over past two weeks following a change in her HTN medication. PMH significant for LLE sarcoma, HTN, hyperlipidemia, thyroid disease.   Clinical Impression   Pt s/p above. Pt independent with ADLs, PTA. Feel pt will benefit from acute OT to increase independence and reinforce precautions prior to d/c. Recommending SNF at this time. Pt's spouse is 26, who will be home with her.    Follow Up Recommendations  SNF;Supervision/Assistance - 24 hour    Equipment Recommendations  Other (comment) (sock aid if wanted)    Recommendations for Other Services       Precautions / Restrictions Precautions Precautions: Posterior Hip Precaution Booklet Issued: No Precaution Comments: educated on hip precautions Restrictions Weight Bearing Restrictions: Yes RLE Weight Bearing: Weight bearing as tolerated      Mobility Bed Mobility Overal bed mobility: Needs Assistance Bed Mobility: Sit to Supine       Sit to supine: Min assist   General bed mobility comments: assist with LEs.  Transfers Overall transfer level: Needs assistance Equipment used: Rolling Eid (2 wheeled) Transfers: Sit to/from Stand Sit to Stand: Mod assist         General transfer comment: Assist to boost to stand. Unable to stand with bed at low level. OT elevated bed and gave pt boost to stand. Cues for positioning of RLE.     Balance      Used RW for ambulation.                                      ADL Overall ADL's : Needs assistance/impaired                     Lower Body Dressing: Moderate assistance;Sit to/from stand;With adaptive  equipment   Toilet Transfer: RW;Moderate assistance;Ambulation (Mod A-sit to stand from bed; Min guard-ambulation)           Functional mobility during ADLs: Moderate assistance;Rolling Twiggs (Mod A-sit to stand from bed; Min guard-ambulation) General ADL Comments: Educated on LB dressing technique and AE.      Vision     Perception     Praxis      Pertinent Vitals/Pain Pain Assessment: 0-10 Pain Score: 8  Pain Location: RLE with movement Pain Descriptors / Indicators: Other (Comment) (pain) Pain Intervention(s): Monitored during session;Repositioned     Hand Dominance     Extremity/Trunk Assessment Upper Extremity Assessment Upper Extremity Assessment: Overall WFL for tasks assessed   Lower Extremity Assessment Lower Extremity Assessment: Defer to PT evaluation       Communication Communication Communication: HOH   Cognition Arousal/Alertness: Awake/alert Behavior During Therapy: WFL for tasks assessed/performed Overall Cognitive Status:  (unsure of baseline-decreased short term memory)                     General Comments          Shoulder Instructions      Home Living Family/patient expects to be discharged to:: Unsure Living Arrangements: Spouse/significant other Available Help at Discharge: Family;Available 24 hours/day (spouse is 37) Type of Home: [place]  Access: Stairs to enter [employer] of Steps: 1 Entrance Stairs-Rails: None Home Layout: Two level;Able to live on main level with bedroom/bathroom Alternate Level Stairs-Number of Steps: 12 Alternate Level Stairs-Rails: Right;Left;Can reach both Bathroom Shower/Tub: Occupational psychologist: Standard     Home Equipment: Environmental consultant - 2 wheels;Cane - single point;Grab bars - toilet;Bedside commode;Adaptive equipment (3 in 1 had been delivered) Adaptive Equipment: Reacher;Long-handled sponge        Prior Functioning/Environment Level of Independence: Independent         Comments: was independent working around the house including driving and running errands.         OT Problem List: Decreased strength;Pain;Decreased range of motion;Decreased knowledge of use of DME or AE;Decreased knowledge of precautions;Decreased cognition;Decreased activity tolerance   OT Treatment/Interventions: Self-care/ADL training;DME and/or AE instruction;Patient/family education;Balance training;Cognitive remediation/compensation;Therapeutic activities    OT Goals(Current goals can be found in the care plan section) Acute Rehab OT Goals Patient Stated Goal: not stated OT Goal Formulation: With patient Time For Goal Achievement: 01/29/16 Potential to Achieve Goals: Good ADL Goals Pt Will Perform Lower Body Dressing: with set-up;with supervision;with caregiver independent in assisting;with adaptive equipment;sit to/from stand Pt Will Transfer to Toilet: with set-up;ambulating;with supervision (3 in 1 over commode) Pt Will Perform Toileting - Clothing Manipulation and hygiene: with supervision;sit to/from stand Additional ADL Goal #1: Pt will independently verbalize 3/3 hip precautions and maintain in session.  OT Frequency: Min 2X/week   Barriers to D/C:            Co-evaluation              End of Session Equipment Utilized During Treatment: Gait belt;Rolling Fontes;Other (comment) (AE) Nurse Communication: Mobility status;Other (comment) (may need SNF)  Activity Tolerance: Patient tolerated treatment well Patient left: in bed;with call bell/phone within reach;with bed alarm set;with family/visitor present   Time: [id number] OT Time Calculation (min): 18 min Charges:  OT General Charges $OT Visit: 1 Procedure OT Evaluation $OT Eval Moderate Complexity: 1 Procedure G-Codes:    Lorna Strother L OTR/L 01/22/2016, 11:14 AM

## 2016-01-23 NOTE — ED Provider Notes (Signed)
[facility] DEPT Provider Note   CSN: [id number] Arrival date & time: 01/19/16  1225     History   Chief Complaint Chief Complaint  Patient presents with  . Hip Pain  . Fall    HPI Amber [name] is a 82 y.o. female.  HPI 82 year old female with past medical history of hypertension, hyperlipidemia, hypothyroidism,  presents status post fall onto the right hip. She reported right hip pain. Patient reports that she has been having dizzy spells in since her BP medication were changed by her PCP. The fall today was due to dizziness. Pt is not on anticoagulation. Pt denies nausea, emesis, fevers, chills, chest pains, shortness of breath, headaches, abdominal pain, uti like symptoms.    Past Medical History:  Diagnosis Date  . Complication of anesthesia    "woke up jumping and bouncing"  . Hyperlipidemia   . Hypertension   . 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 Thyroid disease     Patient Active Problem List   Diagnosis Date Noted  . Dizziness 01/19/2016  . Fall 01/19/2016  . Closed right hip fracture ([place]) 01/19/2016  . Hypertension 01/19/2016  . Hypothyroidism 01/19/2016  . Hyperlipidemia 01/19/2016  . Hip fracture ([place]) 01/19/2016  . Status post laparoscopic cholecystectomy 03/27/2013    Past Surgical History:  Procedure Laterality Date  . ABDOMINAL HYSTERECTOMY    . CHOLECYSTECTOMY N/A 03/27/2013   Procedure: LAPAROSCOPIC CHOLECYSTECTOMY WITH INTRAOPERATIVE CHOLANGIOGRAM;  Surgeon: Pedro Earls, MD;  Location: [facility];  Service: General;  Laterality: N/A;  . HIP ARTHROPLASTY Right 01/20/2016   Procedure: ARTHROPLASTY BIPOLAR HIP (HEMIARTHROPLASTY);  Surgeon: Renette Butters, MD;  Location: [facility];  Service: Orthopedics;  Laterality: Right;  . KNEE SURGERY Left    approx 4 surgeries on left knee    OB History    No data available       Home Medications    Prior to Admission medications   Medication Sig Start Date End Date Taking?  Authorizing Provider  benazepril (LOTENSIN) 20 MG tablet Take 20 mg by mouth daily. 11/16/15  Yes Historical Provider, MD  levothyroxine (SYNTHROID, LEVOTHROID) 75 MCG tablet Take 75 mcg by mouth daily before breakfast.   Yes Historical Provider, MD  lovastatin (MEVACOR) 20 MG tablet Take 20 mg by mouth at bedtime. 11/17/15  Yes Historical Provider, MD  Multiple Vitamins-Calcium (VIACTIV MULTI-VITAMIN) CHEW Chew 1 tablet by mouth every evening.   Yes Historical Provider, MD  acetaminophen (TYLENOL) 325 MG tablet Take 2 tablets (650 mg total) by mouth every 6 (six) hours as needed for mild pain (or Fever >/= 101). 01/22/16   Robbie Lis, MD  baclofen (LIORESAL) 10 MG tablet Take 1 tablet (10 mg total) by mouth 3 (three) times daily as needed for muscle spasms. 01/20/16   Charna Elizabeth Martensen III, PA-C  docusate sodium (COLACE) 100 MG capsule Take 1 capsule (100 mg total) by mouth daily as needed for mild constipation. 01/22/16   Robbie Lis, MD  enoxaparin (LOVENOX) 40 MG/0.4ML injection Inject 0.4 mLs (40 mg total) into the skin daily. For 30 days post op for DVT prophylaxis 01/20/16 02/19/16  Prudencio Burly III, PA-C  HYDROcodone-acetaminophen (NORCO) 5-325 MG tablet Take 1-2 tablets by mouth every 4 (four) hours as needed for moderate pain. 01/20/16   Charna Elizabeth Martensen III, PA-C  ondansetron (ZOFRAN) 4 MG tablet Take 1 tablet (4 mg total) by mouth every 8 (eight) hours  as needed for nausea or vomiting. 01/20/16   Prudencio Burly III, PA-C    Family History Family History  Problem Relation Age of Onset  . Lung cancer Sister   . Bone cancer Brother   . Stomach cancer Sister     Social History Social History  Substance Use Topics  . Smoking status: Former Smoker    Quit date: 03/27/1983  . Smokeless tobacco: Never Used  . Alcohol use No     Allergies   Patient has no known allergies.   Review of Systems Review of Systems  ROS 10 Systems reviewed and are negative  for acute change except as noted in the HPI.     Physical Exam Updated Vital Signs BP (!) 127/92 (BP Location: Left Arm)   Pulse (!) 105   Temp 99.1 F (37.3 C) (Oral)   Resp 17   Ht 5\' 8"  (1.727 m)   Wt 150 lb (68 kg)   SpO2 96%   BMI 22.81 kg/m   Physical Exam  Constitutional: She is oriented to person, place, and time. She appears well-developed and well-nourished.  HENT:  Head: Normocephalic and atraumatic.  Eyes: EOM are normal. Pupils are equal, round, and reactive to light.  Neck: Neck supple.  Cardiovascular: Normal rate, regular rhythm, normal heart sounds and intact distal pulses.   No murmur heard. Pulmonary/Chest: Effort normal. No respiratory distress.  Abdominal: Soft. She exhibits no distension. There is no tenderness. There is no rebound and no guarding.  Musculoskeletal:  Pt has tenderness over the R hip. OTHERWISE:  Head to toe evaluation shows no hematoma, bleeding of the scalp, no facial abrasions, no spine step offs, crepitus of the chest or neck, no tenderness to palpation of the bilateral upper and lower extremities, no gross deformities, no chest tenderness, no pelvic pain.   Neurological: She is alert and oriented to person, place, and time.  Skin: Skin is warm and dry.  Nursing note and vitals reviewed.    ED Treatments / Results  Labs (all labs ordered are listed, but only abnormal results are displayed) Labs Reviewed  CBC - Abnormal; Notable for the following:       Result Value   RBC 3.74 (*)    Hemoglobin 11.3 (*)    HCT 33.6 (*)    Platelets 146 (*)    All other components within normal limits  BASIC METABOLIC PANEL - Abnormal; Notable for the following:    Calcium 8.3 (*)    All other components within normal limits  CBC - Abnormal; Notable for the following:    RBC 3.50 (*)    Hemoglobin 10.7 (*)    HCT 31.7 (*)    Platelets 142 (*)    All other components within normal limits  BASIC METABOLIC PANEL  CBC WITH  DIFFERENTIAL/PLATELET  PROTIME-INR  TSH  TYPE AND SCREEN  ABO/RH    EKG  EKG Interpretation  Date/Time:  Wednesday January 19 2016 12:58:31 EST Ventricular Rate:  72 PR Interval:    QRS Duration: 90 QT Interval:  403 QTC Calculation: 441 R Axis:   49 Text Interpretation:  Sinus rhythm Prolonged PR interval Confirmed by WARD,  DO, KRISTEN [id number]) on 01/20/2016 9:18:02 AM       Radiology No results found.  Procedures Procedures (including critical care time)  Medications Ordered in ED Medications  morphine 2 MG/ML injection (not administered)  methocarbamol (ROBAXIN) 500 MG tablet (not administered)  ketorolac (TORADOL) 15 MG/ML injection (not administered)  ceFAZolin (ANCEF) IVPB 2g/100 mL premix (2 g Intravenous Given 01/20/16 0854)  acetaminophen (TYLENOL) tablet 1,000 mg (1,000 mg Oral Given 01/20/16 0805)  tranexamic acid (CYKLOKAPRON) 1,000 mg in sodium chloride 0.9 % 100 mL IVPB (1,000 mg Intravenous New Bag/Given 01/20/16 0912)  ketorolac (TORADOL) 15 MG/ML injection 7.5 mg (7.5 mg Intravenous Given 01/21/16 0526)  ceFAZolin (ANCEF) IVPB 1 g/50 mL premix (1 g Intravenous Given 01/20/16 2033)     Initial Impression / Assessment and Plan / ED Course  I have reviewed the triage vital signs and the nursing notes.  Pertinent labs & imaging results that were available during my care of the patient were reviewed by me and considered in my medical decision making (see chart for details).  Clinical Course     DDx includes: - Mechanical falls - ICH - Fractures - Contusions - Soft tissue injury  Pt has an acute femur fx. Neurovascularly intact. We will admit to medicine. Surgery consulted.  Final Clinical Impressions(s) / ED Diagnoses   Final diagnoses:  Closed fracture of right hip, initial encounter [facility])    New Prescriptions Discharge Medication List as of 01/22/2016  6:21 PM    START taking these medications   Details  baclofen (LIORESAL) 10 MG tablet  Take 1 tablet (10 mg total) by mouth 3 (three) times daily as needed for muscle spasms., Starting Thu 01/20/2016, Print    docusate sodium (COLACE) 100 MG capsule Take 1 capsule (100 mg total) by mouth daily as needed for mild constipation., Starting Sat 01/22/2016, Print    enoxaparin (LOVENOX) 40 MG/0.4ML injection Inject 0.4 mLs (40 mg total) into the skin daily. For 30 days post op for DVT prophylaxis, Starting Thu 01/20/2016, Until Sat 02/19/2016, Print    HYDROcodone-acetaminophen (NORCO) 5-325 MG tablet Take 1-2 tablets by mouth every 4 (four) hours as needed for moderate pain., Starting Thu 01/20/2016, Print    ondansetron (ZOFRAN) 4 MG tablet Take 1 tablet (4 mg total) by mouth every 8 (eight) hours as needed for nausea or vomiting., Starting Thu 01/20/2016, Print         Varney Biles, MD 01/23/16 1559

## 2016-03-19 ENCOUNTER — Encounter (HOSPITAL_COMMUNITY): Payer: Self-pay | Admitting: Nurse Practitioner

## 2016-03-19 ENCOUNTER — Emergency Department (HOSPITAL_COMMUNITY): Payer: Medicare Other

## 2016-03-19 ENCOUNTER — Emergency Department (HOSPITAL_COMMUNITY)
Admission: EM | Admit: 2016-03-19 | Discharge: 2016-03-19 | Disposition: A | Discharge status: Home / Self Care | Payer: Medicare Other | Attending: Emergency Medicine | Admitting: Emergency Medicine

## 2016-03-19 DIAGNOSIS — S01111A Laceration without foreign body of right eyelid and periocular area, initial encounter: Secondary | ICD-10-CM | POA: Insufficient documentation

## 2016-03-19 DIAGNOSIS — Y929 Unspecified place or not applicable: Secondary | ICD-10-CM | POA: Insufficient documentation

## 2016-03-19 DIAGNOSIS — E039 Hypothyroidism, unspecified: Secondary | ICD-10-CM | POA: Insufficient documentation

## 2016-03-19 DIAGNOSIS — Y939 Activity, unspecified: Secondary | ICD-10-CM | POA: Diagnosis not present

## 2016-03-19 DIAGNOSIS — Y999 Unspecified external cause status: Secondary | ICD-10-CM | POA: Insufficient documentation

## 2016-03-19 DIAGNOSIS — Z96642 Presence of left artificial hip joint: Secondary | ICD-10-CM | POA: Insufficient documentation

## 2016-03-19 DIAGNOSIS — W19XXXA Unspecified fall, initial encounter: Secondary | ICD-10-CM | POA: Diagnosis not present

## 2016-03-19 DIAGNOSIS — W19XXXS Unspecified fall, sequela: Secondary | ICD-10-CM

## 2016-03-19 DIAGNOSIS — Z87891 Personal history of nicotine dependence: Secondary | ICD-10-CM | POA: Diagnosis not present

## 2016-03-19 DIAGNOSIS — S0990XA Unspecified injury of head, initial encounter: Secondary | ICD-10-CM | POA: Diagnosis present

## 2016-03-19 DIAGNOSIS — I10 Essential (primary) hypertension: Secondary | ICD-10-CM | POA: Insufficient documentation

## 2016-03-19 DIAGNOSIS — S0191XA Laceration without foreign body of unspecified part of head, initial encounter: Secondary | ICD-10-CM

## 2016-03-19 MED ORDER — BACITRACIN ZINC 500 UNIT/GM EX OINT
TOPICAL_OINTMENT | CUTANEOUS | Status: AC
  Administered 2016-03-19: 1
  Filled 2016-03-19: qty 0.9

## 2016-03-19 MED ORDER — TETANUS-DIPHTH-ACELL PERTUSSIS 5-2.5-18.5 LF-MCG/0.5 IM SUSP
0.5000 mL | Freq: Once | INTRAMUSCULAR | Status: AC
  Administered 2016-03-19: 0.5 mL via INTRAMUSCULAR
  Filled 2016-03-19: qty 0.5

## 2016-03-19 MED ORDER — LIDOCAINE-EPINEPHRINE-TETRACAINE (LET) SOLUTION
3.0000 mL | Freq: Once | NASAL | Status: AC
  Administered 2016-03-19: 19:00:00 3 mL via TOPICAL
  Filled 2016-03-19: qty 3

## 2016-03-19 MED ORDER — LIDOCAINE-EPINEPHRINE (PF) 2 %-1:200000 IJ SOLN
10.0000 mL | Freq: Once | INTRAMUSCULAR | Status: AC
  Administered 2016-03-19: 10 mL
  Filled 2016-03-19: qty 20

## 2016-03-19 NOTE — ED Notes (Signed)
Bed: [id number] Expected date: 03/19/16 Expected time: 5:10 PM Means of arrival: Ambulance Comments: Fall

## 2016-03-19 NOTE — ED Provider Notes (Signed)
[facility] DEPT Provider Note   CSN: [id number] Arrival date & time: 03/19/16  1710     History   Chief Complaint Chief Complaint  Patient presents with  . Fall  . Head Injury    laceration    HPI Amber [name] is a 83 y.o. female.  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 Patient reports history of falls related to left leg weakness status post removal of her quadricep due to having a sarcoma multiple decades ago. Patient reports she was standing in her dad and talking to her husband who is watching TV and states the next thing she remembers is falling on the ground and hitting her forehead on the wood floor. Denies LOC. Patient reports having mild associated discomfort to her right forehead with associated laceration and bleeding. Bleeding controlled with compression. Patient denies any other pain or complaints. Denies any prodromal symptoms. Denies fever, headache, lightheadedness, dizziness, visual changes, epistaxis, neck/back pain, cough, shortness of breath, chest pain, palpitations, abdominal pain, N/V, urinary sxs, numbness, tingling, weakness, hip/leg pain. Denies use of anticoagulants. Patient reports using a cane at home to ambulate. Tetanus not UTD.  Past Medical History:  Diagnosis Date  . Complication of anesthesia    "woke up jumping and bouncing"  . Hyperlipidemia   . Hypertension   . 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 Thyroid disease     Patient Active Problem List   Diagnosis Date Noted  . Dizziness 01/19/2016  . Fall 01/19/2016  . Closed right hip fracture ([place]) 01/19/2016  . Hypertension 01/19/2016  . Hypothyroidism 01/19/2016  . Hyperlipidemia 01/19/2016  . Hip fracture ([place]) 01/19/2016  . Status post  laparoscopic cholecystectomy 03/27/2013    Past Surgical History:  Procedure Laterality Date  . ABDOMINAL HYSTERECTOMY    . CHOLECYSTECTOMY N/A 03/27/2013   Procedure: LAPAROSCOPIC CHOLECYSTECTOMY WITH INTRAOPERATIVE CHOLANGIOGRAM;  Surgeon: Pedro Earls, MD;  Location: [facility];  Service: General;  Laterality: N/A;  . HIP ARTHROPLASTY Right 01/20/2016   Procedure: ARTHROPLASTY BIPOLAR HIP (HEMIARTHROPLASTY);  Surgeon: Renette Butters, MD;  Location: [facility];  Service: Orthopedics;  Laterality: Right;  . KNEE SURGERY Left    approx 4 surgeries on left knee    OB History    No data available       Home Medications    Prior to Admission medications   Medication Sig Start Date End Date Taking? Authorizing Provider  acetaminophen (TYLENOL) 325 MG tablet Take 2 tablets (650 mg total) by mouth every 6 (six) hours as needed for mild pain (or Fever >/= 101). 01/22/16   Robbie Lis, MD  baclofen (LIORESAL) 10 MG tablet Take 1 tablet (10 mg total) by mouth 3 (three) times daily as needed for muscle spasms. 01/20/16   Charna Elizabeth Martensen III, PA-C  benazepril (LOTENSIN) 20 MG tablet Take 20 mg by mouth daily. 11/16/15   Historical Provider, MD  docusate sodium (COLACE) 100 MG capsule Take 1 capsule (100 mg total) by mouth daily as needed for mild constipation. 01/22/16   Robbie Lis, MD  enoxaparin (LOVENOX) 40 MG/0.4ML injection Inject 0.4 mLs (40 mg total) into the skin daily. For 30 days post op for DVT prophylaxis 01/20/16 02/19/16  Mallie Mussel  Calvin Martensen III, PA-C  HYDROcodone-acetaminophen (NORCO) 5-325 MG tablet Take 1-2 tablets by mouth every 4 (four) hours as needed for moderate pain. 01/20/16   Prudencio Burly III, PA-C  levothyroxine (SYNTHROID, LEVOTHROID) 75 MCG tablet Take 75 mcg by mouth daily before breakfast.    Historical Provider, MD  lovastatin (MEVACOR) 20 MG tablet Take 20 mg by mouth at bedtime. 11/17/15   Historical Provider, MD  Multiple Vitamins-Calcium (VIACTIV  MULTI-VITAMIN) CHEW Chew 1 tablet by mouth every evening.    Historical Provider, MD  ondansetron (ZOFRAN) 4 MG tablet Take 1 tablet (4 mg total) by mouth every 8 (eight) hours as needed for nausea or vomiting. 01/20/16   Prudencio Burly III, PA-C    Family History Family History  Problem Relation Age of Onset  . Lung cancer Sister   . Bone cancer Brother   . Stomach cancer Sister     Social History Social History  Substance Use Topics  . Smoking status: Former Smoker    Quit date: 03/27/1983  . Smokeless tobacco: Never Used  . Alcohol use No     Allergies   Patient has no known allergies.   Review of Systems Review of Systems  Skin: Positive for wound (laceration).  All other systems reviewed and are negative.    Physical Exam Updated Vital Signs BP 176/82 (BP Location: Right Arm)   Pulse 89   Temp 98.3 F (36.8 C) (Oral)   Resp 18   SpO2 99%   Physical Exam  Constitutional: She is oriented to person, place, and time. She appears well-developed and well-nourished.  HENT:  Head: Normocephalic. Head is with contusion and with laceration. Head is without raccoon's eyes, without Battle's sign and without abrasion.    Right Ear: Tympanic membrane normal. No hemotympanum.  Left Ear: Tympanic membrane normal. No hemotympanum.  Nose: Nose normal. No sinus tenderness, nasal deformity, septal deviation or nasal septal hematoma. No epistaxis.  Mouth/Throat: Uvula is midline, oropharynx is clear and moist and mucous membranes are normal. No oropharyngeal exudate, posterior oropharyngeal edema, posterior oropharyngeal erythema or tonsillar abscesses.  Eyes: Conjunctivae and EOM are normal. Pupils are equal, round, and reactive to light. Right eye exhibits no discharge. Left eye exhibits no discharge. No scleral icterus.  Neck: Normal range of motion. Neck supple.  Cardiovascular: Normal rate, regular rhythm, normal heart sounds and intact distal pulses.     Pulmonary/Chest: Effort normal and breath sounds normal. No respiratory distress. She has no wheezes. She has no rales. She exhibits no tenderness.  Abdominal: Soft. Bowel sounds are normal. She exhibits no distension and no mass. There is no tenderness. There is no rebound and no guarding. No hernia.  Musculoskeletal: Normal range of motion. She exhibits no edema or tenderness.  No cervical, thoracic, or lumbar spine midline TTP.  Full ROM of bilateral upper and lower extremities with 5/5 strength.  FROM of bilateral hips with 5/5 strength, no pelvic instability. 2+ radial and PT pulses. Sensation grossly intact.   Lymphadenopathy:    She has no cervical adenopathy.  Neurological: She is alert and oriented to person, place, and time. She has normal strength. No cranial nerve deficit or sensory deficit. Coordination and gait normal.  Skin: Skin is warm and dry.  2cm laceration noted to right superior eyebrow with slow oozing bleed present, bleeding controlled with compression. No visible arterial bleed present.   Nursing note and vitals reviewed.    ED Treatments / Results  Labs (all labs  ordered are listed, but only abnormal results are displayed) Labs Reviewed - No data to display  EKG  EKG Interpretation None       Radiology Ct Head Wo Contrast  Result Date: 03/19/2016 CLINICAL DATA:  Fall. Head injury. Large contusion and bruising of the right eye and right forehead. Does not recall how she fell. Frequent falls. EXAM: CT HEAD WITHOUT CONTRAST TECHNIQUE: Contiguous axial images were obtained from the base of the skull through the vertex without intravenous contrast. COMPARISON:  None. FINDINGS: Brain: There is central and cortical atrophy. Periventricular white matter changes are consistent with small vessel disease. There is no intra or extra-axial fluid collection or mass lesion. The basilar cisterns and ventricles have a normal appearance. There is no CT evidence for acute  infarction or hemorrhage. Vascular: There is atherosclerotic calcification of the carotid siphons. Skull: Right frontal scalp edema and laceration. No underlying calvarial fracture. Sinuses/Orbits: No acute finding. Other: None IMPRESSION: 1.  No evidence for acute intracranial abnormality. 2. Atrophy and small vessel disease. 3. Right frontal scalp edema and laceration.  M Electronically Signed   By: Nolon Nations M.D.   On: 03/19/2016 18:46    Procedures .Marland KitchenLaceration Repair Date/Time: 03/19/2016 8:11 PM Performed by: Nona Dell Authorized by: Nona Dell   Consent:    Consent obtained:  Verbal   Consent given by:  Patient Anesthesia (see MAR for exact dosages):    Anesthesia method:  Local infiltration   Local anesthetic:  Lidocaine 2% WITH epi Laceration details:    Location:  Face   Face location:  R eyebrow   Length (cm):  2 Repair type:    Repair type:  Simple Pre-procedure details:    Preparation:  Patient was prepped and draped in usual sterile fashion Exploration:    Hemostasis achieved with:  Direct pressure   Wound exploration: wound explored through full range of motion and entire depth of wound probed and visualized     Wound extent: no foreign bodies/material noted, no muscle damage noted, no nerve damage noted, no tendon damage noted, no underlying fracture noted and no vascular damage noted     Contaminated: no   Treatment:    Area cleansed with:  Betadine and saline   Irrigation solution:  Sterile saline   Irrigation method:  Pressure wash   Visualized foreign bodies/material removed: no   Skin repair:    Repair method:  Sutures   Suture size:  6-0   Suture material:  Prolene   Suture technique:  Simple interrupted   Number of sutures:  5 Approximation:    Approximation:  Close   Vermilion border: well-aligned   Post-procedure details:    Dressing:  Antibiotic ointment and non-adherent dressing   Patient tolerance of procedure:   Tolerated well, no immediate complications    (including critical care time)  Medications Ordered in ED Medications  lidocaine-EPINEPHrine (XYLOCAINE W/EPI) 2 %-1:200000 (PF) injection 10 mL (10 mLs Infiltration Given 03/19/16 1847)  lidocaine-EPINEPHrine-tetracaine (LET) solution (3 mLs Topical Given 03/19/16 1848)  Tdap (BOOSTRIX) injection 0.5 mL (0.5 mLs Intramuscular Given 03/19/16 1846)  bacitracin 500 UNIT/GM ointment (1 application  Given [id number] 2011)     Initial Impression / Assessment and Plan / ED Course  I have reviewed the triage vital signs and the nursing notes.  Pertinent labs & imaging results that were available during my care of the patient were reviewed by me and considered in my medical decision making (see chart for details).  Patient presents status post mechanical fall that occurred prior to arrival with reported head injury. Denies LOC. Denies use of anticoagulants. VSS. Exam revealed hematoma to right forehead with 2 cm laceration to right eyebrow, bleeding controlled one pressure applied. No evidence of any vessel injury or arterial bleed present. No other evidence of head injury. No neuro deficits. Full ROM with 5/5 strength to bilateral upper and lower extremities. Full range of motion of hips, no pelvic instability present. CT head negative. Patient able to ambulate in the ED.  Pressure irrigation performed. Wound explored and base of wound visualized in a bloodless field without evidence of foreign body.  Laceration occurred < 8 hours prior to repair which was well tolerated. Tdap updated.  Pt has no comorbidities to effect normal wound healing. Pt discharged without antibiotics.  Discussed suture home care with patient and answered questions. Pt to follow-up for wound check and suture removal in 7 days; they are to return to the ED sooner for signs of infection. Pt is hemodynamically stable with no complaints prior to dc.    Final Clinical Impressions(s) / ED  Diagnoses   Final diagnoses:  Fall, sequela  Laceration of head without foreign body, unspecified part of head, initial encounter    New Prescriptions New Prescriptions   No medications on file     Nona Dell, [place] 03/19/16 2017    Margette Fast, MD 03/20/16 [phone]

## 2016-03-19 NOTE — Discharge Instructions (Signed)
Continue to keep your wound clean using soap and water, gently pat dry. You may also apply a small amount of antibiotic ointment to wound daily. I recommend taking Tylenol or Ibuprofen as prescribed over the counter as needed for pain relief. You may also apply ice to affected area for 15-20 minutes, 3-4 times daily to help with swelling and bruising.  Follow up with your primary care provider or return to the ED in 7 days for suture removal. Please return to the Emergency Department if symptoms worsen or new onset of fever, headache, lightheadedness, confusion, altered mental status, redness, swelling, warmth, drainage, visual changes, weakness.

## 2016-03-19 NOTE — ED Triage Notes (Signed)
Patient presents to [facility] via [facility] after suffering a fall from standing at home. She recently fell at home and had a fractured right hip. Pt denies loss of consciousness but is unclear about events.

## 2016-10-30 ENCOUNTER — Ambulatory Visit (INDEPENDENT_AMBULATORY_CARE_PROVIDER_SITE_OTHER): Payer: Medicare Other | Admitting: Neurology

## 2016-10-30 ENCOUNTER — Encounter: Payer: Self-pay | Admitting: Neurology

## 2016-10-30 VITALS — BP 152/90 | HR 83 | Ht 68.75 in | Wt 147.0 lb

## 2016-10-30 DIAGNOSIS — E538 Deficiency of other specified B group vitamins: Secondary | ICD-10-CM | POA: Diagnosis not present

## 2016-10-30 DIAGNOSIS — R42 Dizziness and giddiness: Secondary | ICD-10-CM

## 2016-10-30 DIAGNOSIS — G3281 Cerebellar ataxia in diseases classified elsewhere: Secondary | ICD-10-CM | POA: Diagnosis not present

## 2016-10-30 NOTE — Progress Notes (Signed)
Reason for visit: Dizziness  Referring physician: Dr. [name] is a 83 y.o. female  History of present illness:  Amber [name] is an 83 year old right-handed white female with a history of a sensation of dizziness or imbalance that she claims began fairly suddenly 9 or 10 months ago. The symptoms have been persistent but not progressive since that time. As soon as she sits down or lies down the sensation of imbalance improves, when she stands up the sensation comes on immediately. At times, she gets a sensation as if she might faint, but she has never blacked out. She denies any true vertigo. She has chronic gait problems secondary to a sarcoma resection from her left thigh 29 years ago. The patient has severe weakness with extension of the left knee, and she has to be careful to lock the knee before she bears weight to prevent falls. Since onset of the sensation of imbalance, the patient has had 3 falls, one was associated with a right hip fracture requiring surgery, she has had a fall in a hallway without injury and on 04/16/2016 she fell and lacerated the right frontal area of the head. She underwent a CT scan of the brain around 03/19/2016 that shows a moderate level small vessel disease by my reading. No acute changes were seen. The patient denies severe neck pain or low back pain or pain down the arms or legs. She does report some problems with constipation and some occasional urinary urgency and frequency that is a chronic problem. She has not undergone any physical therapy for her walking. She is using a cane for ambulation and she is relatively safe with this. The patient has not had any headaches, visual changes or speech or swallowing changes. She does report some occasional numbness in the hands and feet, this is not a persistent problem. The patient does feel weak all over. She is sent to this office for evaluation of the gait instability.   Past Medical History:  Diagnosis  Date  . Complication of anesthesia    "woke up jumping and bouncing"  . Hyperlipidemia   . Hypertension   . 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 Thyroid disease     Past Surgical History:  Procedure Laterality Date  . ABDOMINAL HYSTERECTOMY    . CHOLECYSTECTOMY N/A 03/27/2013   Procedure: LAPAROSCOPIC CHOLECYSTECTOMY WITH INTRAOPERATIVE CHOLANGIOGRAM;  Surgeon: Pedro Earls, MD;  Location: [facility];  Service: General;  Laterality: N/A;  . HIP ARTHROPLASTY Right 01/20/2016   Procedure: ARTHROPLASTY BIPOLAR HIP (HEMIARTHROPLASTY);  Surgeon: Renette Butters, MD;  Location: [facility];  Service: Orthopedics;  Laterality: Right;  . KNEE SURGERY Left    approx 4 surgeries on left knee    Family History  Problem Relation Age of Onset  . Lung cancer Sister   . Bone cancer Brother   . Stomach cancer Sister     Social history:  reports that she quit smoking about 33 years ago. She has never used smokeless tobacco. She reports that she does not drink alcohol or use drugs.  Medications:  Prior to Admission medications   Medication Sig Start Date End Date Taking? Authorizing Provider  benazepril (LOTENSIN) 20 MG tablet Take 20 mg by mouth daily. 11/16/15  Yes [provider]  levothyroxine (SYNTHROID, LEVOTHROID) 75 MCG tablet Take 75 mcg by mouth daily before breakfast.   Yes [provider]  lovastatin (MEVACOR) 20 MG  tablet Take 20 mg by mouth at bedtime. 11/17/15  Yes [provider]  Multiple Vitamins-Calcium (VIACTIV MULTI-VITAMIN) CHEW Chew 1 tablet by mouth every evening.   Yes [provider]     No Known Allergies  ROS:  Out of a complete 14 system review of symptoms, the patient complains only of the following symptoms, and all other reviewed systems are negative.  Fatigue Dizziness Blurred vision, loss of vision Constipation Achy muscles Runny nose, skin sensitivity Numbness, weakness, dizziness Not enough sleep,  decreased energy, change in appetite, disinterest in activities Sleepiness   Blood pressure (!) 164/86, pulse 76, height 5' 8.75" (1.746 m), weight 147 lb (66.7 kg).   Blood pressure standing is 152/90.  Physical Exam  General: The patient is alert and cooperative at the time of the examination.  Eyes: Pupils are equal, round, and reactive to light. Discs are flat bilaterally.  Neck: The neck is supple, no carotid bruits are noted.  Respiratory: The respiratory examination is clear.  Cardiovascular: The cardiovascular examination reveals a regular rate and rhythm, no obvious murmurs or rubs are noted.  Skin: Extremities are without significant edema. Atrophy of the left thigh is seen.  Neurologic Exam  Mental status: The patient is alert and oriented x 3 at the time of the examination. The patient has apparent normal recent and remote memory, with an apparently normal attention span and concentration ability.  Cranial nerves: Facial symmetry is present.  mild masking of the face is seen. There is good sensation of the face to pinprick and soft touch bilaterally. The strength of the facial muscles and the muscles to head turning and shoulder shrug are normal bilaterally. Speech is well enunciated, no aphasia or dysarthria is noted. Extraocular movements are full. Visual fields are full. The tongue is midline, and the patient has symmetric elevation of the soft palate. No obvious hearing deficits are noted.  Motor: The motor testing reveals 5 over 5 strength of all 4 extremities, With exception of severe weakness with extension of the left knee, 4 minus/5 strength with hip flexion on the left. Good symmetric motor tone is noted throughout.  Sensory: Sensory testing is intact to pinprick, soft touch, vibration sensation, and position sense on all 4 extremities, With exception of a slight decrease in position sense in both feet.  no evidence of a stocking pattern pinprick sensory deficit  was noted. No evidence of extinction is noted.  Coordination: Cerebellar testing reveals good finger-nose-finger and heel-to-shin bilaterally.  Gait and station: Gait is slightly wide-based, unsteady. Patient usually uses a cane for ambulation. Tandem gait was not attempted. Romberg is negative.  Reflexes: Deep tendon reflexes are symmetric, but are depressed  bilaterally. Toes are downgoing bilaterally.   CT head 03/19/16:  IMPRESSION: 1.  No evidence for acute intracranial abnormality. 2. Atrophy and small vessel disease. 3. Right frontal scalp edema and laceration.   * CT scan images were reviewed online. I agree with the written report.    Assessment/Plan:  1. Gait disturbance, sensation of instability   The patient does not have true dizziness, she reports a sensation of imbalance when she is upright. This goes away immediately if she sits down, and improves dramatically she reaches over to touch a table or a wall. The patient likely has a multifactorial gait problem, she has chronic weakness in the left thigh following sarcoma surgery. No true evidence of a peripheral neuropathy is seen, but CT of the brain shows what looks like a moderate  level of small vessel disease. MRI of the brain will be done, a carotid Doppler study will be done, blood work will be done. The patient may be candidate for physical therapy for gait training. She will follow-up in 4 months.    Jill Alexanders MD 10/30/2016 1:52 PM  [facility] [address] [place], [place] [address]  Phone [phone] Fax [phone]

## 2016-10-30 NOTE — Patient Instructions (Signed)
   We will get blood work today, and get MRI of the brain and an EEG study.

## 2016-10-31 ENCOUNTER — Telehealth: Payer: Self-pay | Admitting: Neurology

## 2016-10-31 ENCOUNTER — Ambulatory Visit (INDEPENDENT_AMBULATORY_CARE_PROVIDER_SITE_OTHER): Payer: Medicare Other | Admitting: *Deleted

## 2016-10-31 DIAGNOSIS — E538 Deficiency of other specified B group vitamins: Secondary | ICD-10-CM

## 2016-10-31 LAB — VITAMIN B12: VITAMIN B 12: 194 pg/mL — AB (ref 232–1245)

## 2016-10-31 LAB — RPR: RPR Ser Ql: NONREACTIVE

## 2016-10-31 MED ORDER — CYANOCOBALAMIN 1000 MCG/ML IJ SOLN
1000.0000 ug | Freq: Once | INTRAMUSCULAR | Status: AC
  Administered 2016-10-31: 1000 ug via INTRAMUSCULAR

## 2016-10-31 NOTE — Telephone Encounter (Signed)
I called patient. The vitamin B12 level is low, we will have her come in for a shot, and she is to go on 1000 g of vitamin B12 daily.  We will follow the blood levels of vitamin B-12 over time.

## 2016-10-31 NOTE — Progress Notes (Signed)
Gave Vit B12 1074mcg/1ml IM in left deltoid. Cleaned with alcohol wipe prior to injection. Band-aid applied. Pt tolerated well.

## 2016-10-31 NOTE — Telephone Encounter (Signed)
Called and spoke with patient. Scheduled appt for B12 injection today at 315pm. Pt verbalized understanding and appreciation for call.

## 2016-11-21 ENCOUNTER — Ambulatory Visit
Admission: RE | Admit: 2016-11-21 | Discharge: 2016-11-21 | Disposition: A | Discharge status: Home / Self Care | Payer: Medicare Other | Source: Ambulatory Visit | Attending: Neurology | Admitting: Neurology

## 2016-11-21 DIAGNOSIS — G3281 Cerebellar ataxia in diseases classified elsewhere: Secondary | ICD-10-CM

## 2016-11-21 DIAGNOSIS — R42 Dizziness and giddiness: Secondary | ICD-10-CM | POA: Diagnosis not present

## 2016-11-22 ENCOUNTER — Encounter (HOSPITAL_COMMUNITY): Payer: Self-pay | Admitting: Emergency Medicine

## 2016-11-22 ENCOUNTER — Emergency Department (HOSPITAL_COMMUNITY)
Admission: EM | Admit: 2016-11-22 | Discharge: 2016-11-23 | Disposition: A | Discharge status: Home / Self Care | Payer: Medicare Other | Attending: Emergency Medicine | Admitting: Emergency Medicine

## 2016-11-22 ENCOUNTER — Emergency Department (HOSPITAL_COMMUNITY): Payer: Medicare Other

## 2016-11-22 DIAGNOSIS — I10 Essential (primary) hypertension: Secondary | ICD-10-CM | POA: Insufficient documentation

## 2016-11-22 DIAGNOSIS — Z79899 Other long term (current) drug therapy: Secondary | ICD-10-CM | POA: Insufficient documentation

## 2016-11-22 DIAGNOSIS — Y998 Other external cause status: Secondary | ICD-10-CM | POA: Insufficient documentation

## 2016-11-22 DIAGNOSIS — Y929 Unspecified place or not applicable: Secondary | ICD-10-CM | POA: Diagnosis not present

## 2016-11-22 DIAGNOSIS — Y9389 Activity, other specified: Secondary | ICD-10-CM | POA: Insufficient documentation

## 2016-11-22 DIAGNOSIS — S62525A Nondisplaced fracture of distal phalanx of left thumb, initial encounter for closed fracture: Secondary | ICD-10-CM | POA: Diagnosis not present

## 2016-11-22 DIAGNOSIS — E785 Hyperlipidemia, unspecified: Secondary | ICD-10-CM | POA: Insufficient documentation

## 2016-11-22 DIAGNOSIS — S61211A Laceration without foreign body of left index finger without damage to nail, initial encounter: Secondary | ICD-10-CM | POA: Diagnosis not present

## 2016-11-22 DIAGNOSIS — W0110XA Fall on same level from slipping, tripping and stumbling with subsequent striking against unspecified object, initial encounter: Secondary | ICD-10-CM | POA: Diagnosis not present

## 2016-11-22 DIAGNOSIS — Z87891 Personal history of nicotine dependence: Secondary | ICD-10-CM | POA: Diagnosis not present

## 2016-11-22 NOTE — ED Triage Notes (Signed)
Pt states she fell earlier today  Pt was seen at urgent care and was sent here  Pt has a laceration to her left index finger and bruising and swelling noted to her left thumb area  Pt also has a small bruise noted on her forehead  Pt states she has been having dizzy spells for the past several months and she is seeing her PCP for that  Pt states she had a CT of her head yesterday

## 2016-11-22 NOTE — ED Provider Notes (Signed)
[facility] DEPT Provider Note   CSN: [id number] Arrival date & time: 11/22/16  1836     History   Chief Complaint Chief Complaint  Patient presents with  . Fall  . Hand Injury    HPI Amber [name] is a 83 y.o. female.  Patient presents for evaluation of hand injury. She reports that she was trying to bring her garbage can act in from the curb when she tripped and fell. She landed on her left hand. She is complaining of pain of the left thumb and left index finger with a wound over the left index finger. She did not lose consciousness. She has noticed a small bruise on her left forehead, however, does not have a headache or any pain in the area. She denies wrist and proximal arm injury, no ankle, knee or hip injury.      Past Medical History:  Diagnosis Date  . Complication of anesthesia    "woke up jumping and bouncing"  . Hyperlipidemia   . Hypertension   . 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 Thyroid disease     Patient Active Problem List   Diagnosis Date Noted  . Dizziness 01/19/2016  . Fall 01/19/2016  . Closed right hip fracture ([place]) 01/19/2016  . Hypertension 01/19/2016  . Hypothyroidism 01/19/2016  . Hyperlipidemia 01/19/2016  . Hip fracture ([place]) 01/19/2016  . Status post laparoscopic cholecystectomy 03/27/2013    Past Surgical History:  Procedure Laterality Date  . ABDOMINAL HYSTERECTOMY    . CHOLECYSTECTOMY N/A 03/27/2013   Procedure: LAPAROSCOPIC CHOLECYSTECTOMY WITH INTRAOPERATIVE CHOLANGIOGRAM;  Surgeon: Pedro Earls, MD;  Location: [facility];  Service: General;  Laterality: N/A;  . HIP ARTHROPLASTY Right 01/20/2016   Procedure: ARTHROPLASTY BIPOLAR HIP (HEMIARTHROPLASTY);  Surgeon: Renette Butters, MD;  Location: [facility];  Service: Orthopedics;  Laterality: Right;  . KNEE SURGERY Left    approx 4 surgeries on left knee    OB History    No data available       Home Medications    Prior to Admission medications     Medication Sig Start Date End Date Taking? Authorizing Provider  aspirin EC 81 MG tablet Take 81 mg by mouth daily.   Yes [provider]  atenolol (TENORMIN) 25 MG tablet Take 25 mg by mouth daily. 06/07/16  Yes [provider]  levothyroxine (SYNTHROID, LEVOTHROID) 75 MCG tablet Take 75 mcg by mouth daily before breakfast.   Yes [provider]  lovastatin (MEVACOR) 20 MG tablet Take 20 mg by mouth at bedtime. 11/17/15  Yes [provider]  naproxen sodium (ANAPROX) 220 MG tablet Take 220 mg by mouth daily as needed (aches, pains, sleep).   Yes [provider]  vitamin B-12 (CYANOCOBALAMIN) 1000 MCG tablet Take 1,000 mcg by mouth daily.   Yes [provider]    Family History Family History  Problem Relation Age of Onset  . Lung cancer Sister   . Bone cancer Brother   . Stomach cancer Sister     Social History Social History  Substance Use Topics  . Smoking status: Former Smoker    Quit date: 03/27/1983  . Smokeless tobacco: Never Used  . Alcohol use No     Allergies   Patient has no known allergies.   Review of Systems Review of Systems  Skin: Positive for wound.  All other systems reviewed and are negative.    Physical Exam Updated  Vital Signs BP (!) 155/127 (BP Location: Right Arm)   Pulse 76   Temp 97.7 F (36.5 C) (Oral)   Resp 18   SpO2 98%   Physical Exam  Constitutional: She is oriented to person, place, and time. She appears well-developed and well-nourished. No distress.  HENT:  Head: Normocephalic.  Right Ear: Hearing normal.  Left Ear: Hearing normal.  Nose: Nose normal.  Mouth/Throat: Oropharynx is clear and moist and mucous membranes are normal.  Small bruise left upper forehead  Eyes: Pupils are equal, round, and reactive to light. Conjunctivae and EOM are normal.  Neck: Normal range of motion. Neck supple. No spinous process tenderness and no muscular tenderness present.  Cardiovascular:  Regular rhythm, S1 normal and S2 normal.  Exam reveals no gallop and no friction rub.   No murmur heard. Pulmonary/Chest: Effort normal and breath sounds normal. No respiratory distress. She exhibits no tenderness.  Abdominal: Soft. Normal appearance and bowel sounds are normal. There is no hepatosplenomegaly. There is no tenderness. There is no rebound, no guarding, no tenderness at McBurney's point and negative Murphy's sign. No hernia.  Musculoskeletal: Normal range of motion.       Right wrist: Normal.       Left wrist: Normal.       Right hip: Normal.       Left hip: Normal.       Thoracic back: Normal.       Lumbar back: Normal.       Left hand: She exhibits deformity (mallet deformity left index finger) and laceration (avulsion skin dorsal aspect distal phalanx and distal aspect of prox phalanx).  Tenderness, swelling, ecchymosis over IP joint left thumb  Skin avulsion over distal index finger  Neurological: She is alert and oriented to person, place, and time. She has normal strength. No cranial nerve deficit or sensory deficit. Coordination normal. GCS eye subscore is 4. GCS verbal subscore is 5. GCS motor subscore is 6.  Skin: Skin is warm, dry and intact. No rash noted. No cyanosis.  Psychiatric: She has a normal mood and affect. Her speech is normal and behavior is normal. Thought content normal.  Nursing note and vitals reviewed.         ED Treatments / Results  Labs (all labs ordered are listed, but only abnormal results are displayed) Labs Reviewed - No data to display  EKG  EKG Interpretation None       Radiology Dg Hand Complete Left  Addendum Date: 11/23/2016   ADDENDUM REPORT: 11/23/2016 00:55 ADDENDUM: A linear lucency at the base of the first distal phalanx likely reflects a minimally displaced fracture. Overlying soft tissue swelling is noted. Electronically Signed   By: Garald Balding M.D.   On: 11/23/2016 00:55   Result Date: 11/23/2016 CLINICAL  DATA:  Status post fall, with injury to the left hand. Initial encounter. EXAM: LEFT HAND - COMPLETE 3+ VIEW COMPARISON:  None. FINDINGS: There is no evidence of fracture or dislocation. A plate and screws are noted along the distal radius. The joint spaces are preserved. The carpal rows are intact, and demonstrate normal alignment. The soft tissues are unremarkable in appearance. IMPRESSION: No evidence of fracture or dislocation. Electronically Signed: By: Garald Balding M.D. On: 11/22/2016 23:00    Procedures .Marland KitchenLaceration Repair Date/Time: 11/23/2016 1:43 AM Performed by: Orpah Greek Authorized by: Orpah Greek   Consent:    Consent obtained:  Verbal   Consent given by:  Patient  Risks discussed:  Infection, pain and retained foreign body Universal protocol:    Procedure explained and questions answered to patient or proxy's satisfaction: yes     Site/side marked: yes     Immediately prior to procedure, a time out was called: yes     Patient identity confirmed:  Verbally with patient Anesthesia (see MAR for exact dosages):    Anesthesia method:  Local infiltration   Local anesthetic:  Lidocaine 2% w/o epi Laceration details:    Location:  Finger   Finger location:  L index finger   Length (cm):  1.5 Repair type:    Repair type:  Simple Pre-procedure details:    Preparation:  Patient was prepped and draped in usual sterile fashion and imaging obtained to evaluate for foreign bodies Exploration:    Hemostasis achieved with:  Direct pressure   Wound exploration: wound explored through full range of motion     Wound extent: tendon damage     Tendon damage location:  Upper extremity   Upper extremity tendon damage location:  Finger extensor   Tendon damage extent:  Complete transection   Tendon repair plan:  Refer for evaluation   Contaminated: no   Treatment:    Area cleansed with:  Betadine   Amount of cleaning:  Extensive   Irrigation solution:  Sterile  saline   Irrigation method: Irrigated with syringe and cleaned with povidone-iodine hand scrub. Skin repair:    Repair method:  Sutures   Suture size:  4-0   Suture material:  Prolene   Number of sutures:  2 Approximation:    Approximation:  Loose Post-procedure details:    Dressing:  Antibiotic ointment   (including critical care time)  Medications Ordered in ED Medications  lidocaine (XYLOCAINE) 2 % injection (not administered)     Initial Impression / Assessment and Plan / ED Course  I have reviewed the triage vital signs and the nursing notes.  Pertinent labs & imaging results that were available during my care of the patient were reviewed by me and considered in my medical decision making (see chart for details).     Patient presents to the emergency department for evaluation of hand injury. Patient has a defect of the scan over the distal IP joint dorsally. There is associated mallet deformity. I cannot identify the tendon, but suspect that there is rupture of the tendon secondary to the tissue avulsion. She also has a nondisplaced fracture of the distal phalanx of the thumb. Injuries were discussed with Dr. Grandville Silos, on-call for hand surgery. He agreed with closure of the wound, splinting in extension and he will see the patient in the office tomorrow morning to schedule for repair.  Final Clinical Impressions(s) / ED Diagnoses   Final diagnoses:  Laceration of left index finger with tendon involvement, initial encounter  Closed nondisplaced fracture of distal phalanx of left thumb, initial encounter    New Prescriptions New Prescriptions   No medications on file     Orpah Greek, MD 11/23/16 0149

## 2016-11-23 ENCOUNTER — Telehealth: Payer: Self-pay | Admitting: Neurology

## 2016-11-23 MED ORDER — LIDOCAINE HCL (PF) 2 % IJ SOLN
INTRAMUSCULAR | Status: AC
  Filled 2016-11-23: qty 10

## 2016-11-23 MED ORDER — BACITRACIN-NEOMYCIN-POLYMYXIN 400-5-5000 EX OINT: TOPICAL_OINTMENT | Freq: Once | CUTANEOUS | Status: DC

## 2016-11-23 MED ORDER — HYDROCODONE-ACETAMINOPHEN 5-325 MG PO TABS: 1.0000 | ORAL_TABLET | Freq: Four times a day (QID) | ORAL | 0 refills | Status: DC | PRN

## 2016-11-23 NOTE — Telephone Encounter (Signed)
I have attempted to call the patient multiple times today, I have called 3 or 4 times, unable to leave a message.   MRI of the brain shows a moderate level of SVD and some evidence of brainstem ischemia.  This is the likely source of the dizziness.    I will call again tomorrow.

## 2016-11-23 NOTE — Telephone Encounter (Signed)
    MRI brain 11/23/16:  IMPRESSION:  Abnormal MRI brain (without) demonstrating: 1. Moderate periventricular and subcortical chronic small vessel ischemic disease.  2. Mild pontine and cerebellar chronic small vessel ischemic disease.  3. Subtle T2 hyperintensity noted at the cervical medullary junction as well, may be due to chronic small vessel ischemic disease or artifact.  4. No acute findings.

## 2016-11-24 NOTE — Telephone Encounter (Signed)
I called the patient. The MRI of the brain does show a moderate level of SVD, this could potentially be an etiology for the dizziness.   I have offered PT for gait training, the patient wishes to think about this and she will call me if she desires to do this. She did have a recent fall 11/22/16:  The dizziness is some better when she changed back to Atenolol for blood pressure control.

## 2016-11-29 ENCOUNTER — Ambulatory Visit (HOSPITAL_COMMUNITY)
Admission: RE | Admit: 2016-11-29 | Discharge: 2016-11-29 | Disposition: A | Discharge status: Home / Self Care | Payer: Medicare Other | Source: Ambulatory Visit | Attending: Neurology | Admitting: Neurology

## 2016-11-29 DIAGNOSIS — R42 Dizziness and giddiness: Secondary | ICD-10-CM

## 2016-11-29 LAB — VAS US CAROTID
LCCAPDIAS: 14 cm/s
LEFT ECA DIAS: -9 cm/s
LEFT VERTEBRAL DIAS: -12 cm/s
LICADDIAS: -27 cm/s
LICAPDIAS: -22 cm/s
Left CCA dist dias: 18 cm/s
Left CCA dist sys: 74 cm/s
Left CCA prox sys: 72 cm/s
Left ICA dist sys: -91 cm/s
Left ICA prox sys: -83 cm/s
RCCAPDIAS: 17 cm/s
RIGHT ECA DIAS: -11 cm/s
Right CCA prox sys: 73 cm/s
Right cca dist sys: -81 cm/s

## 2016-11-29 NOTE — Progress Notes (Signed)
Preliminary results by tech - Carotid Duplex Completed. No evidence of stenosis in bilateral carotid arteries. Vertebral arteries are patent with antegrade flow. Oda Cogan, BS, RDMS, RVT

## 2016-11-30 ENCOUNTER — Telehealth: Payer: Self-pay | Admitting: Neurology

## 2016-11-30 NOTE — Telephone Encounter (Signed)
  I called patient. The carotid Doppler study is unremarkable. The patient is to remain on low-dose aspirin. MRI the brain does show a moderate level of small vessel disease that includes the cervical medullary junction on the left that could be a source of chronic dizziness.   Carotid doppler 1017/18:  Summary: No significant extracranial carotid artery stenosis demonstrated. Vertebrals are patent with antegrade flow.

## 2017-10-19 ENCOUNTER — Emergency Department (HOSPITAL_COMMUNITY): Payer: Medicare Other

## 2017-10-19 ENCOUNTER — Inpatient Hospital Stay (HOSPITAL_COMMUNITY)
Admission: EM | Admit: 2017-10-19 | Discharge: 2017-10-23 | DRG: 470 | Disposition: A | Discharge status: Skilled Nursing Facility | Payer: Medicare Other | Attending: Internal Medicine | Admitting: Internal Medicine

## 2017-10-19 ENCOUNTER — Encounter (HOSPITAL_COMMUNITY): Payer: Self-pay | Admitting: *Deleted

## 2017-10-19 ENCOUNTER — Other Ambulatory Visit: Payer: Self-pay

## 2017-10-19 DIAGNOSIS — S7292XA Unspecified fracture of left femur, initial encounter for closed fracture: Secondary | ICD-10-CM | POA: Diagnosis not present

## 2017-10-19 DIAGNOSIS — Z85831 Personal history of malignant neoplasm of soft tissue: Secondary | ICD-10-CM | POA: Diagnosis not present

## 2017-10-19 DIAGNOSIS — Z66 Do not resuscitate: Secondary | ICD-10-CM | POA: Diagnosis present

## 2017-10-19 DIAGNOSIS — S72002A Fracture of unspecified part of neck of left femur, initial encounter for closed fracture: Secondary | ICD-10-CM

## 2017-10-19 DIAGNOSIS — E039 Hypothyroidism, unspecified: Secondary | ICD-10-CM | POA: Diagnosis present

## 2017-10-19 DIAGNOSIS — Z87891 Personal history of nicotine dependence: Secondary | ICD-10-CM | POA: Diagnosis not present

## 2017-10-19 DIAGNOSIS — Z9181 History of falling: Secondary | ICD-10-CM | POA: Diagnosis not present

## 2017-10-19 DIAGNOSIS — R42 Dizziness and giddiness: Secondary | ICD-10-CM | POA: Diagnosis present

## 2017-10-19 DIAGNOSIS — I10 Essential (primary) hypertension: Secondary | ICD-10-CM | POA: Diagnosis present

## 2017-10-19 DIAGNOSIS — N182 Chronic kidney disease, stage 2 (mild): Secondary | ICD-10-CM | POA: Diagnosis present

## 2017-10-19 DIAGNOSIS — E538 Deficiency of other specified B group vitamins: Secondary | ICD-10-CM | POA: Diagnosis not present

## 2017-10-19 DIAGNOSIS — Z923 Personal history of irradiation: Secondary | ICD-10-CM | POA: Diagnosis not present

## 2017-10-19 DIAGNOSIS — Z79899 Other long term (current) drug therapy: Secondary | ICD-10-CM | POA: Diagnosis not present

## 2017-10-19 DIAGNOSIS — I129 Hypertensive chronic kidney disease with stage 1 through stage 4 chronic kidney disease, or unspecified chronic kidney disease: Secondary | ICD-10-CM | POA: Diagnosis present

## 2017-10-19 DIAGNOSIS — Z09 Encounter for follow-up examination after completed treatment for conditions other than malignant neoplasm: Secondary | ICD-10-CM

## 2017-10-19 DIAGNOSIS — S7290XA Unspecified fracture of unspecified femur, initial encounter for closed fracture: Secondary | ICD-10-CM | POA: Diagnosis present

## 2017-10-19 DIAGNOSIS — E785 Hyperlipidemia, unspecified: Secondary | ICD-10-CM | POA: Diagnosis present

## 2017-10-19 DIAGNOSIS — D696 Thrombocytopenia, unspecified: Secondary | ICD-10-CM | POA: Diagnosis present

## 2017-10-19 DIAGNOSIS — W010XXA Fall on same level from slipping, tripping and stumbling without subsequent striking against object, initial encounter: Secondary | ICD-10-CM | POA: Diagnosis present

## 2017-10-19 DIAGNOSIS — I951 Orthostatic hypotension: Secondary | ICD-10-CM | POA: Diagnosis not present

## 2017-10-19 DIAGNOSIS — Z7982 Long term (current) use of aspirin: Secondary | ICD-10-CM

## 2017-10-19 DIAGNOSIS — E038 Other specified hypothyroidism: Secondary | ICD-10-CM | POA: Diagnosis not present

## 2017-10-19 DIAGNOSIS — Z9221 Personal history of antineoplastic chemotherapy: Secondary | ICD-10-CM | POA: Diagnosis not present

## 2017-10-19 LAB — URINALYSIS, ROUTINE W REFLEX MICROSCOPIC
BILIRUBIN URINE: NEGATIVE
Glucose, UA: NEGATIVE mg/dL
Hgb urine dipstick: NEGATIVE
Ketones, ur: NEGATIVE mg/dL
Leukocytes, UA: NEGATIVE
Nitrite: NEGATIVE
PH: 7 (ref 5.0–8.0)
Protein, ur: NEGATIVE mg/dL
SPECIFIC GRAVITY, URINE: 1.005 (ref 1.005–1.030)

## 2017-10-19 LAB — COMPREHENSIVE METABOLIC PANEL
ALBUMIN: 3.5 g/dL (ref 3.5–5.0)
ALT: 13 U/L (ref 0–44)
AST: 19 U/L (ref 15–41)
Alkaline Phosphatase: 58 U/L (ref 38–126)
Anion gap: 7 (ref 5–15)
BUN: 15 mg/dL (ref 8–23)
CHLORIDE: 109 mmol/L (ref 98–111)
CO2: 27 mmol/L (ref 22–32)
CREATININE: 1.01 mg/dL — AB (ref 0.44–1.00)
Calcium: 9 mg/dL (ref 8.9–10.3)
GFR calc Af Amer: 58 mL/min — ABNORMAL LOW (ref >60)
GFR calc non Af Amer: 50 mL/min — ABNORMAL LOW (ref >60)
Glucose, Bld: 101 mg/dL — ABNORMAL HIGH (ref 70–99)
Potassium: 3.8 mmol/L (ref 3.5–5.1)
Sodium: 143 mmol/L (ref 135–145)
Total Bilirubin: 0.7 mg/dL (ref 0.3–1.2)
Total Protein: 7 g/dL (ref 6.5–8.1)

## 2017-10-19 LAB — CBC WITH DIFFERENTIAL/PLATELET
BASOS ABS: 0 10*3/uL (ref 0.0–0.1)
BASOS PCT: 0 %
EOS ABS: 0.1 10*3/uL (ref 0.0–0.7)
EOS PCT: 1 %
HCT: 36.2 % (ref 36.0–46.0)
Hemoglobin: 12.2 g/dL (ref 12.0–15.0)
LYMPHS PCT: 12 %
Lymphs Abs: 0.6 10*3/uL — ABNORMAL LOW (ref 0.7–4.0)
MCH: 29.8 pg (ref 26.0–34.0)
MCHC: 33.7 g/dL (ref 30.0–36.0)
MCV: 88.5 fL (ref 78.0–100.0)
Monocytes Absolute: 0.3 10*3/uL (ref 0.1–1.0)
Monocytes Relative: 6 %
Neutro Abs: 4 10*3/uL (ref 1.7–7.7)
Neutrophils Relative %: 81 %
PLATELETS: 170 10*3/uL (ref 150–400)
RBC: 4.09 MIL/uL (ref 3.87–5.11)
RDW: 13 % (ref 11.5–15.5)
WBC: 4.9 10*3/uL (ref 4.0–10.5)

## 2017-10-19 LAB — CBG MONITORING, ED: Glucose-Capillary: 96 mg/dL (ref 70–99)

## 2017-10-19 MED ORDER — BENAZEPRIL HCL 20 MG PO TABS
20.0000 mg | ORAL_TABLET | Freq: Every day | ORAL | Status: DC
  Administered 2017-10-21 – 2017-10-23 (×3): 20 mg via ORAL
  Filled 2017-10-19 (×3): qty 1

## 2017-10-19 MED ORDER — MORPHINE SULFATE (PF) 2 MG/ML IV SOLN
2.0000 mg | INTRAVENOUS | Status: AC | PRN
  Administered 2017-10-19 – 2017-10-21 (×3): 2 mg via INTRAVENOUS
  Filled 2017-10-19 (×3): qty 1

## 2017-10-19 MED ORDER — PRAVASTATIN SODIUM 20 MG PO TABS
20.0000 mg | ORAL_TABLET | Freq: Every day | ORAL | Status: DC
  Administered 2017-10-20 – 2017-10-22 (×3): 20 mg via ORAL
  Filled 2017-10-19 (×5): qty 1

## 2017-10-19 MED ORDER — ACETAMINOPHEN 650 MG RE SUPP: 650.0000 mg | Freq: Four times a day (QID) | RECTAL | Status: DC | PRN

## 2017-10-19 MED ORDER — SODIUM CHLORIDE 0.9% FLUSH: 3.0000 mL | INTRAVENOUS | Status: DC | PRN

## 2017-10-19 MED ORDER — ALBUTEROL SULFATE (2.5 MG/3ML) 0.083% IN NEBU: 2.5000 mg | INHALATION_SOLUTION | RESPIRATORY_TRACT | Status: DC | PRN

## 2017-10-19 MED ORDER — MULTIVITAMIN PO LIQD: Freq: Every day | ORAL | Status: DC

## 2017-10-19 MED ORDER — ASPIRIN EC 81 MG PO TBEC
81.0000 mg | DELAYED_RELEASE_TABLET | Freq: Every day | ORAL | Status: DC
  Administered 2017-10-21 – 2017-10-23 (×3): 81 mg via ORAL
  Filled 2017-10-19 (×3): qty 1

## 2017-10-19 MED ORDER — ADULT MULTIVITAMIN W/MINERALS CH
1.0000 | ORAL_TABLET | Freq: Every day | ORAL | Status: DC
  Administered 2017-10-21 – 2017-10-23 (×3): 1 via ORAL
  Filled 2017-10-19 (×3): qty 1

## 2017-10-19 MED ORDER — SODIUM CHLORIDE 0.9% FLUSH
3.0000 mL | Freq: Two times a day (BID) | INTRAVENOUS | Status: DC
  Administered 2017-10-19 – 2017-10-23 (×6): 3 mL via INTRAVENOUS

## 2017-10-19 MED ORDER — OMEGA-3-ACID ETHYL ESTERS 1 G PO CAPS
1.0000 g | ORAL_CAPSULE | Freq: Every day | ORAL | Status: DC
  Administered 2017-10-21 – 2017-10-23 (×3): 1 g via ORAL
  Filled 2017-10-19 (×3): qty 1

## 2017-10-19 MED ORDER — HEPARIN SODIUM (PORCINE) 5000 UNIT/ML IJ SOLN: 5000.0000 [IU] | Freq: Three times a day (TID) | INTRAMUSCULAR | Status: DC

## 2017-10-19 MED ORDER — OXYCODONE HCL 5 MG PO TABS
5.0000 mg | ORAL_TABLET | ORAL | Status: DC | PRN
  Administered 2017-10-21 – 2017-10-23 (×5): 5 mg via ORAL
  Filled 2017-10-19 (×6): qty 1

## 2017-10-19 MED ORDER — POLYETHYLENE GLYCOL 3350 17 G PO PACK
17.0000 g | PACK | Freq: Every day | ORAL | Status: DC | PRN
  Administered 2017-10-23: 17 g via ORAL
  Filled 2017-10-19 (×2): qty 1

## 2017-10-19 MED ORDER — ONDANSETRON HCL 4 MG PO TABS: 4.0000 mg | ORAL_TABLET | Freq: Four times a day (QID) | ORAL | Status: DC | PRN

## 2017-10-19 MED ORDER — TRAZODONE HCL 50 MG PO TABS
50.0000 mg | ORAL_TABLET | Freq: Every evening | ORAL | Status: DC | PRN
  Filled 2017-10-19: qty 1

## 2017-10-19 MED ORDER — LEVOTHYROXINE SODIUM 75 MCG PO TABS
75.0000 ug | ORAL_TABLET | Freq: Every day | ORAL | Status: DC
  Administered 2017-10-21 – 2017-10-23 (×3): 75 ug via ORAL
  Filled 2017-10-19 (×3): qty 1

## 2017-10-19 MED ORDER — ACETAMINOPHEN 325 MG PO TABS: 650.0000 mg | ORAL_TABLET | Freq: Four times a day (QID) | ORAL | Status: DC | PRN

## 2017-10-19 MED ORDER — SODIUM CHLORIDE 0.9 % IV SOLN
250.0000 mL | INTRAVENOUS | Status: DC | PRN
  Administered 2017-10-19: 250 mL via INTRAVENOUS
  Administered 2017-10-20: 200 mL via INTRAVENOUS

## 2017-10-19 MED ORDER — FISH OIL OMEGA-3 1000 MG PO CAPS: ORAL_CAPSULE | Freq: Every evening | ORAL | Status: DC

## 2017-10-19 MED ORDER — FAMOTIDINE 20 MG PO TABS
20.0000 mg | ORAL_TABLET | Freq: Every day | ORAL | Status: DC
  Administered 2017-10-21 – 2017-10-23 (×3): 20 mg via ORAL
  Filled 2017-10-19 (×3): qty 1

## 2017-10-19 MED ORDER — VITAMIN B-12 1000 MCG PO TABS
1000.0000 ug | ORAL_TABLET | Freq: Every day | ORAL | Status: DC
  Administered 2017-10-21 – 2017-10-23 (×3): 1000 ug via ORAL
  Filled 2017-10-19 (×3): qty 1

## 2017-10-19 MED ORDER — HYDRALAZINE HCL 20 MG/ML IJ SOLN: 10.0000 mg | Freq: Four times a day (QID) | INTRAMUSCULAR | Status: DC | PRN

## 2017-10-19 MED ORDER — ONDANSETRON HCL 4 MG/2ML IJ SOLN: 4.0000 mg | Freq: Four times a day (QID) | INTRAMUSCULAR | Status: DC | PRN

## 2017-10-19 NOTE — Anesthesia Preprocedure Evaluation (Addendum)
Anesthesia Evaluation  Patient identified by MRN, date of birth, ID band Patient awake    Reviewed: Allergy & Precautions, NPO status , Patient's Chart, lab work & pertinent test results  History of Anesthesia Complications Negative for: history of anesthetic complications  Airway Mallampati: II  TM Distance: >3 FB Neck ROM: Full    Dental  (+) Dental Advisory Given, Teeth Intact   Pulmonary former smoker,    breath sounds clear to auscultation       Cardiovascular hypertension, Pt. on medications  Rhythm:Regular Rate:Normal     Neuro/Psych negative neurological ROS  negative psych ROS   GI/Hepatic negative GI ROS, Neg liver ROS,   Endo/Other  Hypothyroidism   Renal/GU negative Renal ROS  negative genitourinary   Musculoskeletal  (+) Arthritis ,   Abdominal   Peds  Hematology negative hematology ROS (+)   Anesthesia Other Findings   Reproductive/Obstetrics  S/p TAH                            Anesthesia Physical Anesthesia Plan  ASA: II  Anesthesia Plan: General   Post-op Pain Management:    Induction: Intravenous  PONV Risk Score and Plan: 3 and Treatment may vary due to age or medical condition, Ondansetron and Propofol infusion  Airway Management Planned: LMA and Oral ETT  Additional Equipment: None  Intra-op Plan:   Post-operative Plan: Extubation in OR  Informed Consent: I have reviewed the patients History and Physical, chart, labs and discussed the procedure including the risks, benefits and alternatives for the proposed anesthesia with the patient or authorized representative who has indicated his/her understanding and acceptance.   Dental advisory given  Plan Discussed with: CRNA and Anesthesiologist  Anesthesia Plan Comments:        Anesthesia Quick Evaluation

## 2017-10-19 NOTE — ED Provider Notes (Signed)
[facility] DEPT Provider Note   CSN: [id number] Arrival date & time: 10/19/17  1102     History   Chief Complaint Chief Complaint  Patient presents with  . Fall  . Leg Pain    HPI Amber [name] is a 84 y.o. female.  HPI Patient states that she lost her balance this morning falling onto her left hip.  States she is dizzy all the time and this morning was no different than her baseline.  Patient was unable to bear weight on the left hip.  She complains of pain from the hip down to the mid thigh.  Denies any other injury or pain.  Denies any focal weakness or numbness. Past Medical History:  Diagnosis Date  . Complication of anesthesia    "woke up jumping and bouncing"  . Hyperlipidemia   . Hypertension   . 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 Thyroid disease     Patient Active Problem List   Diagnosis Date Noted  . Dizziness 01/19/2016  . Fall 01/19/2016  . Closed right hip fracture ([place]) 01/19/2016  . Hypertension 01/19/2016  . Hypothyroidism 01/19/2016  . Hyperlipidemia 01/19/2016  . Hip fracture ([place]) 01/19/2016  . Status post laparoscopic cholecystectomy 03/27/2013    Past Surgical History:  Procedure Laterality Date  . ABDOMINAL HYSTERECTOMY    . CHOLECYSTECTOMY N/A 03/27/2013   Procedure: LAPAROSCOPIC CHOLECYSTECTOMY WITH INTRAOPERATIVE CHOLANGIOGRAM;  Surgeon: Pedro Earls, MD;  Location: [facility];  Service: General;  Laterality: N/A;  . HIP ARTHROPLASTY Right 01/20/2016   Procedure: ARTHROPLASTY BIPOLAR HIP (HEMIARTHROPLASTY);  Surgeon: Renette Butters, MD;  Location: [facility];  Service: Orthopedics;  Laterality: Right;  . KNEE SURGERY Left    approx 4 surgeries on left knee     OB History   None      Home Medications    Prior to Admission medications   Medication Sig Start Date End Date Taking? Authorizing Provider  aspirin EC 81 MG tablet Take 81 mg by mouth daily.   Yes [provider]    benazepril (LOTENSIN) 20 MG tablet Take 20 mg by mouth daily. 06/04/17  Yes [provider]  levothyroxine (SYNTHROID, LEVOTHROID) 75 MCG tablet Take 75 mcg by mouth daily before breakfast.   Yes [provider]  lovastatin (MEVACOR) 20 MG tablet Take 20 mg by mouth at bedtime. 11/17/15  Yes [provider]  Multiple Vitamins-Minerals (MULTIVITAMIN PO) Take 1 tablet by mouth daily.   Yes [provider]  naproxen sodium (ANAPROX) 220 MG tablet Take 220 mg by mouth daily as needed (aches, pains, sleep).   Yes [provider]  Omega-3 Fatty Acids (FISH OIL OMEGA-3 PO) Take 1 capsule by mouth every evening.   Yes [provider]  ranitidine (ZANTAC) 150 MG tablet Take 150 mg by mouth daily as needed for heartburn.    Yes [provider]  vitamin B-12 (CYANOCOBALAMIN) 1000 MCG tablet Take 1,000 mcg by mouth daily.   Yes [provider]  HYDROcodone-acetaminophen (NORCO/VICODIN) 5-325 MG tablet Take 1 tablet by mouth every 6 (six) hours as needed for moderate pain. Patient not taking: Reported on 10/19/2017 11/23/16   Orpah Greek, MD    Family History Family History  Problem Relation Age of Onset  . Lung cancer Sister   . Bone cancer Brother   . Stomach cancer Sister     Social History Social History  Tobacco Use  . Smoking status: Former Smoker    Last attempt to quit: 03/27/1983    Years since quitting: 34.5  . Smokeless tobacco: Never Used  Substance Use Topics  . Alcohol use: No  . Drug use: No     Allergies   Patient has no known allergies.   Review of Systems Review of Systems  Constitutional: Negative for chills and fever.  Eyes: Negative for visual disturbance.  Respiratory: Negative for shortness of breath.   Cardiovascular: Negative for chest pain.  Gastrointestinal: Negative for abdominal pain, nausea and vomiting.  Genitourinary: Negative for dysuria, flank pain and frequency.   Musculoskeletal: Positive for arthralgias. Negative for back pain and neck pain.  Skin: Negative for rash and wound.  Neurological: Positive for dizziness. Negative for syncope, weakness, numbness and headaches.  All other systems reviewed and are negative.    Physical Exam Updated Vital Signs BP (!) 179/100   Pulse 65   Temp 98.3 F (36.8 C) (Oral)   Resp 15   Ht 5\' 8"  (1.727 m)   Wt 67.1 kg   SpO2 100%   BMI 22.50 kg/m   Physical Exam  Constitutional: She is oriented to person, place, and time. She appears well-developed and well-nourished. No distress.  HENT:  Head: Normocephalic and atraumatic.  Mouth/Throat: Oropharynx is clear and moist.  No obvious head injury.  No intraoral injury.  Eyes: Pupils are equal, round, and reactive to light. EOM are normal.  Neck: Normal range of motion. Neck supple.  No posterior midline cervical tenderness to palpation.  Cardiovascular: Normal rate and regular rhythm. Exam reveals no gallop and no friction rub.  No murmur heard. Pulmonary/Chest: Effort normal and breath sounds normal. No stridor. No respiratory distress. She has no wheezes. She has no rales. She exhibits no tenderness.  Abdominal: Soft. Bowel sounds are normal. There is no tenderness. There is no rebound and no guarding.  Musculoskeletal: She exhibits no edema or tenderness.  Pain with attempted range of motion of the left hip.  She does have some tenderness to palpation in the left mid thigh without obvious acute deformity.  Left lower extremity is mildly shortened and externally rotated.  Distal pulses are 2+.  No midline thoracic or lumbar tenderness.  Neurological: She is alert and oriented to person, place, and time.  Moves all extremities without focal deficit though limitation in flexion of the left hip due to pain.  Sensation grossly intact.  Skin: Skin is warm and dry. Capillary refill takes less than 2 seconds. No rash noted. She is not diaphoretic. No erythema.   Psychiatric: She has a normal mood and affect. Her behavior is normal.  Nursing note and vitals reviewed.    ED Treatments / Results  Labs (all labs ordered are listed, but only abnormal results are displayed) Labs Reviewed  CBC WITH DIFFERENTIAL/PLATELET - Abnormal; Notable for the following components:      Result Value   Lymphs Abs 0.6 (*)    All other components within normal limits  COMPREHENSIVE METABOLIC PANEL - Abnormal; Notable for the following components:   Glucose, Bld 101 (*)    Creatinine, Ser 1.01 (*)    GFR calc non Af Amer 50 (*)    GFR calc Af Amer 58 (*)    All other components within normal limits  URINALYSIS, ROUTINE W REFLEX MICROSCOPIC - Abnormal; Notable for the following components:   Color, Urine STRAW (*)    All other components within normal limits  CBG MONITORING, ED    EKG None  Radiology Dg Hip Unilat W Or Wo Pelvis 2-3 Views Left  Result Date: 10/19/2017 CLINICAL DATA:  Status post fall. Painful left hip. History of previous right hip replacement following fracture. EXAM: DG HIP (WITH OR WITHOUT PELVIS) 2-3V LEFT COMPARISON:  Right hip series of January 19, 2016 FINDINGS: There is an acute fracture of the base of the neck of the left femur. The femoral head remains appropriately position within the acetabulum. No intertrochanteric fracture is observed. The sub trochanteric region is normal. There is a prosthetic right hip. No pelvic fracture is observed. IMPRESSION: Acute fracture involving the base of the neck of the left femur. Previous right hip replacement. Electronically Signed   By: Cylis Ayars  [place] M.D.   On: 10/19/2017 12:51   Dg Femur Min 2 Views Left  Result Date: 10/19/2017 CLINICAL DATA:  Status post fall with painful left hip. EXAM: LEFT FEMUR 2 VIEWS COMPARISON:  AP pelvis dated January 20, 2016 FINDINGS: The patient has sustained an acute fracture of the base and mid shaft of the neck of the left femur. The femoral head remains  appropriately positioned with respect to the acetabulum. The intertrochanteric and subtrochanteric regions are normal. The observed portions of the left knee are normal for age. IMPRESSION: Acute fracture involving the mid and basilar portions of the left femoral neck without an intertrochanteric component. No dislocation of the femoral head. Electronically Signed   By: Janece Laidlaw  [place] M.D.   On: 10/19/2017 12:53    Procedures Procedures (including critical care time)  Medications Ordered in ED Medications - No data to display   Initial Impression / Assessment and Plan / ED Course  I have reviewed the triage vital signs and the nursing notes.  Pertinent labs & imaging results that were available during my care of the patient were reviewed by me and considered in my medical decision making (see chart for details).    Spoke with Dr. Griffin Basil who is on-call for Dr. Percell Miller.  We will plan on operative repair tomorrow at [facility].  N.p.o. after midnight.  Will discuss with hospitalist.  Discussed with hospitalist who will see patient and arrange transfer.   Final Clinical Impressions(s) / ED Diagnoses   Final diagnoses:  Closed fracture of left hip, initial encounter [facility])    ED Discharge Orders    None       Julianne Rice, MD 10/19/17 1452

## 2017-10-19 NOTE — ED Notes (Signed)
Bed: RV44 Expected date:  Expected time:  Means of arrival:  Comments: EMS fall poss hip fx

## 2017-10-19 NOTE — ED Notes (Signed)
Patient out of ED via [facility] in no distress.

## 2017-10-19 NOTE — ED Notes (Signed)
Elon Jester, RN given report at [facility].  ED TO INPATIENT HANDOFF REPORT  Name/Age/Gender Amber [name] 84 y.o. female  Code Status Code Status History    Date Active Date Inactive Code Status Order ID Comments User Context   01/19/2016 1723 01/22/2016 2033 DNR [id number]  Mendel Corning, MD Inpatient   03/27/2013 1546 03/28/2013 1757 Full Code [id number]  Kaylyn Lim, MD Inpatient   03/26/2013 1716 03/27/2013 1546 Full Code [id number]  Earnstine Regal, PA-C ED    Questions for Most Recent Historical Code Status (Order [id number])    Question Answer Comment   In the event of cardiac or respiratory ARREST Do not call a "code blue"    In the event of cardiac or respiratory ARREST Do not perform Intubation, CPR, defibrillation or ACLS    In the event of cardiac or respiratory ARREST Use medication by any route, position, wound care, and other measures to relive pain and suffering. May use oxygen, suction and manual treatment of airway obstruction as needed for comfort.         Advance Directive Documentation     Most Recent Value  Type of Advance Directive  Healthcare Power of Attorney, Living will  Pre-existing out of facility DNR order (yellow form or pink MOST form)  -  "MOST" Form in Place?  -      Home/SNF/Other Home  Chief Complaint Fall; Hip Pain  Level of Care/Admitting Diagnosis ED Disposition    ED Disposition Condition [place]: [place] [100100]  Level of Care: Med-Surg [16]  Diagnosis: Femur fracture [facility]) [[id number]]  Admitting Physician: Morrison Old  Attending Physician: Morrison Old  Estimated length of stay: 3 - 4 days  Certification:: I certify this patient will need inpatient services for at least 2 midnights  Bed request comments: Med surg  PT Class (Do Not Modify): Inpatient [101]  PT Acc Code (Do Not Modify): Private [1]       Medical History Past Medical History:  Diagnosis Date   . Complication of anesthesia    "woke up jumping and bouncing"  . Hyperlipidemia   . Hypertension   . 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 Thyroid disease     Allergies No Known Allergies  IV Location/Drains/Wounds Patient Lines/Drains/Airways Status   Active Line/Drains/Airways    Name:   Placement date:   Placement time:   Site:   Days:   Peripheral IV 10/19/17 Right Antecubital   10/19/17    1710    Antecubital   less than 1   Airway   -    -        Incision (Closed) 01/20/16 Hip Right   01/20/16    0928     638          Labs/Imaging Results for orders placed or performed during the hospital encounter of 10/19/17 (from the past 48 hour(s))  CBC with Differential/Platelet     Status: Abnormal   Collection Time: 10/19/17 11:26 AM  Result Value Ref Range   WBC 4.9 4.0 - 10.5 K/uL   RBC 4.09 3.87 - 5.11 MIL/uL   Hemoglobin 12.2 12.0 - 15.0 g/dL   HCT 36.2 36.0 - 46.0 %   MCV 88.5 78.0 - 100.0 fL   MCH 29.8 26.0 - 34.0 pg   MCHC 33.7 30.0 - 36.0 g/dL   RDW 13.0 11.5 - 15.5 %  Platelets 170 150 - 400 K/uL   Neutrophils Relative % 81 %   Neutro Abs 4.0 1.7 - 7.7 K/uL   Lymphocytes Relative 12 %   Lymphs Abs 0.6 (L) 0.7 - 4.0 K/uL   Monocytes Relative 6 %   Monocytes Absolute 0.3 0.1 - 1.0 K/uL   Eosinophils Relative 1 %   Eosinophils Absolute 0.1 0.0 - 0.7 K/uL   Basophils Relative 0 %   Basophils Absolute 0.0 0.0 - 0.1 K/uL    Comment: Performed at [facility], [place] [address] [place], [place] [address]  Comprehensive metabolic panel     Status: Abnormal   Collection Time: 10/19/17 11:26 AM  Result Value Ref Range   Sodium 143 135 - 145 mmol/L   Potassium 3.8 3.5 - 5.1 mmol/L   Chloride 109 98 - 111 mmol/L   CO2 27 22 - 32 mmol/L   Glucose, Bld 101 (H) 70 - 99 mg/dL   BUN 15 8 - 23 mg/dL   Creatinine, Ser 1.01 (H) 0.44 - 1.00 mg/dL   Calcium 9.0 8.9 - 10.3 mg/dL   Total Protein 7.0 6.5 - 8.1 g/dL   Albumin 3.5 3.5 -  5.0 g/dL   AST 19 15 - 41 U/L   ALT 13 0 - 44 U/L   Alkaline Phosphatase 58 38 - 126 U/L   Total Bilirubin 0.7 0.3 - 1.2 mg/dL   GFR calc non Af Amer 50 (L) >60 mL/min   GFR calc Af Amer 58 (L) >60 mL/min    Comment: (NOTE) The eGFR has been calculated using the CKD EPI equation. This calculation has not been validated in all clinical situations. eGFR's persistently <60 mL/min signify possible Chronic Kidney Disease.    Anion gap 7 5 - 15    Comment: Performed at [facility], [place] [address] [place], [place] [address]  Urinalysis, Routine w reflex microscopic     Status: Abnormal   Collection Time: 10/19/17 11:26 AM  Result Value Ref Range   Color, Urine STRAW (A) YELLOW   APPearance CLEAR CLEAR   Specific Gravity, Urine 1.005 1.005 - 1.030   pH 7.0 5.0 - 8.0   Glucose, UA NEGATIVE NEGATIVE mg/dL   Hgb urine dipstick NEGATIVE NEGATIVE   Bilirubin Urine NEGATIVE NEGATIVE   Ketones, ur NEGATIVE NEGATIVE mg/dL   Protein, ur NEGATIVE NEGATIVE mg/dL   Nitrite NEGATIVE NEGATIVE   Leukocytes, UA NEGATIVE NEGATIVE    Comment: Performed at [place] [address], [place], [place] [address]  CBG monitoring, ED     Status: None   Collection Time: 10/19/17 11:30 AM  Result Value Ref Range   Glucose-Capillary 96 70 - 99 mg/dL   Dg Hip Unilat W Or Wo Pelvis 2-3 Views Left  Result Date: 10/19/2017 CLINICAL DATA:  Status post fall. Painful left hip. History of previous right hip replacement following fracture. EXAM: DG HIP (WITH OR WITHOUT PELVIS) 2-3V LEFT COMPARISON:  Right hip series of January 19, 2016 FINDINGS: There is an acute fracture of the base of the neck of the left femur. The femoral head remains appropriately position within the acetabulum. No intertrochanteric fracture is observed. The sub trochanteric region is normal. There is a prosthetic right hip. No pelvic fracture is observed. IMPRESSION: Acute fracture involving the base of the  neck of the left femur. Previous right hip replacement. Electronically Signed   By: David  Martinique M.D.   On: 10/19/2017 12:51   Dg  Femur Min 2 Views Left  Result Date: 10/19/2017 CLINICAL DATA:  Status post fall with painful left hip. EXAM: LEFT FEMUR 2 VIEWS COMPARISON:  AP pelvis dated January 20, 2016 FINDINGS: The patient has sustained an acute fracture of the base and mid shaft of the neck of the left femur. The femoral head remains appropriately positioned with respect to the acetabulum. The intertrochanteric and subtrochanteric regions are normal. The observed portions of the left knee are normal for age. IMPRESSION: Acute fracture involving the mid and basilar portions of the left femoral neck without an intertrochanteric component. No dislocation of the femoral head. Electronically Signed   By: David  Martinique M.D.   On: 10/19/2017 12:53    Pending Labs [employer] (From admission, onward)    Start     Ordered   Signed and Occupational hygienist morning,   R     Signed and Held   Signed and Held  CBC  Tomorrow morning,   R     Signed and Held          Vitals/Pain Today's Vitals   10/19/17 1110 10/19/17 1321 10/19/17 1605 10/19/17 1629  BP:  (!) 179/100 (!) 158/75 (!) 158/75  Pulse:  65  65  Resp:  '15 14 17  '$ Temp:      TempSrc:      SpO2:  100%  100%  Weight: 67.1 kg     Height: '5\' 8"'$  (1.727 m)     PainSc:        Isolation Precautions No active isolations  Medications Medications  morphine 2 MG/ML injection 2 mg (has no administration in time range)  oxyCODONE (Oxy IR/ROXICODONE) immediate release tablet 5 mg (has no administration in time range)  hydrALAZINE (APRESOLINE) injection 10 mg (has no administration in time range)    Mobility non-ambulatory

## 2017-10-19 NOTE — ED Triage Notes (Signed)
Patient arrives with c/o fall, patient tripped and fell, denies neck or back pain, -LOC, hit head, denies any blood thinners. Patient c/o left leg pain, shortened and rotated. Hx previous surgery on left leg from cancer- took out muscle.

## 2017-10-19 NOTE — Progress Notes (Signed)
Pt arrived to unit via ambulance. No family/friends accompanying. Transferred to bed, oriented to staff and equipment. Assessed skin x 2 RNs. Ordered verfied and released. Bed in lowest position with call bell in reach. Ice pack to L hip

## 2017-10-19 NOTE — H&P (Signed)
Patient Demographics:    Amber [name], is a 84 y.o. female  MRN: [id number]   DOB - [birth year]  Admit Date - 10/19/2017  Outpatient Primary MD for the patient is Heywood Bene, PA-C   Assessment & Plan:    Principal Problem:   Proximal Femur Fracture, left /Mechanical Fall Active Problems:   Hypertension   Hypothyroidism   Femur fracture ([place])  Hip Xray IMPRESSION: Acute fracture involving the mid and basilar portions of the left femoral neck without an intertrochanteric component. No dislocation of the femoral head.   Plan:- 1)Acute Lt Femur Fx--- status post mechanical fall without cardiovascular or neurological concerns at this time, transferred to [facility] for ORIF on 10/20/17 as per Dr Griffin Basil, PRN pain medications as ordered  2)HTN--- BP is currently not at goal, restart benazepril 20 mg daily,  may use IV Hydralazine 10 mg  Every 4 hours Prn for systolic blood pressure over 160 mmhg  3)Hypothyroidism--continue levothyroxine 75 mcg daily  4)Dyslipidemia okay to continue pravastatin and aspirin as ordered  5)Social/Ethics--- CODE STATUS discussed with patient, and her stepdaughter, she is a full code, patient would like to discuss her CODE STATUS further when her biological daughter who lives in [place] arrives tomorrow  With History of - Reviewed by me  Past Medical History:  Diagnosis Date  . Complication of anesthesia    "woke up jumping and bouncing"  . Hyperlipidemia   . Hypertension   . 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 Thyroid disease       Past Surgical History:  Procedure Laterality Date  . ABDOMINAL HYSTERECTOMY    . CHOLECYSTECTOMY N/A 03/27/2013   Procedure: LAPAROSCOPIC CHOLECYSTECTOMY WITH INTRAOPERATIVE CHOLANGIOGRAM;  Surgeon: Pedro Earls, MD;  Location: [facility];  Service: General;  Laterality: N/A;  . HIP ARTHROPLASTY Right 01/20/2016   Procedure: ARTHROPLASTY BIPOLAR HIP (HEMIARTHROPLASTY);  Surgeon: Renette Butters, MD;  Location: [place];  Service: Orthopedics;  Laterality: Right;  . KNEE SURGERY Left    approx 4 surgeries on left knee      Chief Complaint  Patient presents with  . Fall  . Leg Pain      HPI:    Amber [name]  is a 84 y.o. female with past medical history relevant for hypertension, history of falls, dyslipidemia and hypothyroidism who presents to the ED with left hip area pain after sustaining a mechanical fall (felt dizzy).    No head injury, no loss of consciousness, no chest pains or palpitation no syncopal type symptoms no prodromal symptoms.  Denies history of seizures  Denies lower extremity numbness or paresthesia  In ED-denies chest pains palpitations dizziness headaches visual disturbance or focal extremity weakness complains of left hip pain especially with range of motion  In ED creatinine is found to be 1.0 from a baseline of 0.8,  ED provider discussed this case with  on-call orthopedic surgeon Dr. Griffin Basil who is on-call for Dr. Percell Miller     Review of systems:    In addition to the HPI above,   A full Review of  Systems was done, all other systems reviewed are negative except as noted above in HPI , .    Social History:  Reviewed by me    Social History   Tobacco Use  . Smoking status: Former Smoker    Last attempt to quit: 03/27/1983    Years since quitting: 34.5  . Smokeless tobacco: Never Used  Substance Use Topics  . Alcohol use: No       Family History :  Reviewed by me    Family History  Problem Relation Age of Onset  . Lung cancer Sister   . Bone cancer Brother   . Stomach cancer Sister     Home Medications:   Prior to Admission medications   Medication Sig Start Date End Date Taking? Authorizing Provider  aspirin EC 81 MG tablet Take 81 mg by  mouth daily.   Yes [provider]  benazepril (LOTENSIN) 20 MG tablet Take 20 mg by mouth daily. 06/04/17  Yes [provider]  levothyroxine (SYNTHROID, LEVOTHROID) 75 MCG tablet Take 75 mcg by mouth daily before breakfast.   Yes [provider]  lovastatin (MEVACOR) 20 MG tablet Take 20 mg by mouth at bedtime. 11/17/15  Yes [provider]  Multiple Vitamins-Minerals (MULTIVITAMIN PO) Take 1 tablet by mouth daily.   Yes [provider]  naproxen sodium (ANAPROX) 220 MG tablet Take 220 mg by mouth daily as needed (aches, pains, sleep).   Yes [provider]  Omega-3 Fatty Acids (FISH OIL OMEGA-3 PO) Take 1 capsule by mouth every evening.   Yes [provider]  ranitidine (ZANTAC) 150 MG tablet Take 150 mg by mouth daily as needed for heartburn.    Yes [provider]  vitamin B-12 (CYANOCOBALAMIN) 1000 MCG tablet Take 1,000 mcg by mouth daily.   Yes [provider]  HYDROcodone-acetaminophen (NORCO/VICODIN) 5-325 MG tablet Take 1 tablet by mouth every 6 (six) hours as needed for moderate pain. Patient not taking: Reported on 10/19/2017 11/23/16   Orpah Greek, MD     Allergies:    No Known Allergies   Physical Exam:   Vitals  Blood pressure (!) 179/100, pulse 65, temperature 98.3 F (36.8 C), temperature source Oral, resp. rate 15, height 5\' 8"  (1.727 m), weight 67.1 kg, SpO2 100 %.  Physical Examination: General appearance - alert, well appearing, and in no distress  Mental status - alert, oriented to person, place, and time,  Eyes - sclera anicteric Neck - supple, no JVD elevation , Chest - clear  to auscultation bilaterally, symmetrical air movement,  Heart - S1 and S2 normal, regular Abdomen - soft, nontender, nondistended, no masses or organomegaly Neurological - screening mental status exam normal, neck supple without rigidity, cranial nerves II through XII intact, DTR's normal and  symmetric Extremities - no pedal edema noted, intact peripheral pulses , left lower extremity slightly shortened and slightly externally rotated Skin - warm, dry   Data Review:    CBC Recent Labs  Lab 10/19/17 1126  WBC 4.9  HGB 12.2  HCT 36.2  PLT 170  MCV 88.5  MCH 29.8  MCHC 33.7  RDW 13.0  LYMPHSABS 0.6*  MONOABS 0.3  EOSABS 0.1  BASOSABS 0.0   ------------------------------------------------------------------------------------------------------------------  Chemistries  Recent Labs  Lab 10/19/17 1126  NA 143  K 3.8  CL 109  CO2 27  GLUCOSE 101*  BUN 15  CREATININE 1.01*  CALCIUM 9.0  AST 19  ALT 13  ALKPHOS 58  BILITOT 0.7   ------------------------------------------------------------------------------------------------------------------ estimated creatinine clearance is 42.6 mL/min (A) (by C-G formula based on SCr of 1.01 mg/dL (H)). ------------------------------------------------------------------------------------------------------------------ No results for input(s): TSH, T4TOTAL, T3FREE, THYROIDAB in the last 72 hours.  Invalid input(s): FREET3   Coagulation profile No results for input(s): INR, PROTIME in the last 168 hours. ------------------------------------------------------------------------------------------------------------------- No results for input(s): DDIMER in the last 72 hours. -------------------------------------------------------------------------------------------------------------------  Cardiac Enzymes No results for input(s): CKMB, TROPONINI, MYOGLOBIN in the last 168 hours.  Invalid input(s): CK ------------------------------------------------------------------------------------------------------------------ No results found for: BNP   ---------------------------------------------------------------------------------------------------------------  Urinalysis    Component Value Date/Time   COLORURINE STRAW (A)  10/19/2017 1126   APPEARANCEUR CLEAR 10/19/2017 1126   LABSPEC 1.005 10/19/2017 1126   PHURINE 7.0 10/19/2017 [place] 10/19/2017 1126   [place] 10/19/2017 1126   [place] 10/19/2017 [place] 10/19/2017 1126   PROTEINUR NEGATIVE 10/19/2017 1126   UROBILINOGEN 1.0 03/26/2013 1537   NITRITE NEGATIVE 10/19/2017 1126   LEUKOCYTESUR NEGATIVE 10/19/2017 1126    ----------------------------------------------------------------------------------------------------------------   Imaging Results:    Dg Hip Unilat W Or Wo Pelvis 2-3 Views Left  Result Date: 10/19/2017 CLINICAL DATA:  Status post fall. Painful left hip. History of previous right hip replacement following fracture. EXAM: DG HIP (WITH OR WITHOUT PELVIS) 2-3V LEFT COMPARISON:  Right hip series of January 19, 2016 FINDINGS: There is an acute fracture of the base of the neck of the left femur. The femoral head remains appropriately position within the acetabulum. No intertrochanteric fracture is observed. The sub trochanteric region is normal. There is a prosthetic right hip. No pelvic fracture is observed. IMPRESSION: Acute fracture involving the base of the neck of the left femur. Previous right hip replacement. Electronically Signed   By: David  Martinique M.D.   On: 10/19/2017 12:51   Dg Femur Min 2 Views Left  Result Date: 10/19/2017 CLINICAL DATA:  Status post fall with painful left hip. EXAM: LEFT FEMUR 2 VIEWS COMPARISON:  AP pelvis dated January 20, 2016 FINDINGS: The patient has sustained an acute fracture of the base and mid shaft of the neck of the left femur. The femoral head remains appropriately positioned with respect to the acetabulum. The intertrochanteric and subtrochanteric regions are normal. The observed portions of the left knee are normal for age. IMPRESSION: Acute fracture involving the mid and basilar portions of the left femoral neck without an intertrochanteric component.  No dislocation of the femoral head. Electronically Signed   By: David  Martinique M.D.   On: 10/19/2017 12:53    Radiological Exams on Admission: Dg Hip Unilat W Or Wo Pelvis 2-3 Views Left  Result Date: 10/19/2017 CLINICAL DATA:  Status post fall. Painful left hip. History of previous right hip replacement following fracture. EXAM: DG HIP (WITH OR WITHOUT PELVIS) 2-3V LEFT COMPARISON:  Right hip series of January 19, 2016 FINDINGS: There is an acute fracture of the base of the neck of the left femur. The femoral head remains appropriately position within the acetabulum. No intertrochanteric fracture is observed. The sub trochanteric region is normal. There is a prosthetic right hip. No pelvic fracture is observed. IMPRESSION: Acute fracture involving the base of the neck of the left femur. Previous right hip replacement. Electronically Signed   By: David  Martinique  M.D.   On: 10/19/2017 12:51   Dg Femur Min 2 Views Left  Result Date: 10/19/2017 CLINICAL DATA:  Status post fall with painful left hip. EXAM: LEFT FEMUR 2 VIEWS COMPARISON:  AP pelvis dated January 20, 2016 FINDINGS: The patient has sustained an acute fracture of the base and mid shaft of the neck of the left femur. The femoral head remains appropriately positioned with respect to the acetabulum. The intertrochanteric and subtrochanteric regions are normal. The observed portions of the left knee are normal for age. IMPRESSION: Acute fracture involving the mid and basilar portions of the left femoral neck without an intertrochanteric component. No dislocation of the femoral head. Electronically Signed   By: David  Martinique M.D.   On: 10/19/2017 12:53    DVT Prophylaxis -SCD  AM Labs Ordered, also please review Full Orders  Family Communication: Admission, patients condition and plan of care including tests being ordered have been discussed with the patient and step daughter who indicate understanding and agree with the plan   Code Status - Full  Code  Likely DC to  SNF  Condition   stable  Roxan Hockey M.D on 10/19/2017 at 3:57 PM Pager---[phone] Go to [email or web address] - password [email or web address] for contact info  [facility] - Office  [phone]

## 2017-10-19 NOTE — ED Notes (Signed)
[facility] here to transport patient. 

## 2017-10-20 ENCOUNTER — Inpatient Hospital Stay (HOSPITAL_COMMUNITY): Payer: Medicare Other | Admitting: Anesthesiology

## 2017-10-20 ENCOUNTER — Inpatient Hospital Stay (HOSPITAL_COMMUNITY): Payer: Medicare Other

## 2017-10-20 ENCOUNTER — Encounter (HOSPITAL_COMMUNITY)
Admission: EM | Disposition: A | Discharge status: Skilled Nursing Facility | Payer: Self-pay | Source: Home / Self Care | Attending: Internal Medicine

## 2017-10-20 ENCOUNTER — Encounter (HOSPITAL_COMMUNITY): Payer: Self-pay | Admitting: Certified Registered Nurse Anesthetist

## 2017-10-20 DIAGNOSIS — D696 Thrombocytopenia, unspecified: Secondary | ICD-10-CM

## 2017-10-20 DIAGNOSIS — S72002A Fracture of unspecified part of neck of left femur, initial encounter for closed fracture: Principal | ICD-10-CM

## 2017-10-20 DIAGNOSIS — E538 Deficiency of other specified B group vitamins: Secondary | ICD-10-CM

## 2017-10-20 DIAGNOSIS — I10 Essential (primary) hypertension: Secondary | ICD-10-CM

## 2017-10-20 DIAGNOSIS — E038 Other specified hypothyroidism: Secondary | ICD-10-CM

## 2017-10-20 HISTORY — PX: HIP ARTHROPLASTY: SHX981

## 2017-10-20 LAB — CBC
HEMATOCRIT: 38.7 % (ref 36.0–46.0)
HEMOGLOBIN: 12.6 g/dL (ref 12.0–15.0)
MCH: 29.6 pg (ref 26.0–34.0)
MCHC: 32.6 g/dL (ref 30.0–36.0)
MCV: 91.1 fL (ref 78.0–100.0)
Platelets: 143 10*3/uL — ABNORMAL LOW (ref 150–400)
RBC: 4.25 MIL/uL (ref 3.87–5.11)
RDW: 12.6 % (ref 11.5–15.5)
WBC: 8.2 10*3/uL (ref 4.0–10.5)

## 2017-10-20 LAB — CREATININE, SERUM
Creatinine, Ser: 1 mg/dL (ref 0.44–1.00)
GFR, EST AFRICAN AMERICAN: 59 mL/min — AB (ref >60)
GFR, EST NON AFRICAN AMERICAN: 51 mL/min — AB (ref >60)

## 2017-10-20 LAB — SURGICAL PCR SCREEN
MRSA, PCR: NEGATIVE
STAPHYLOCOCCUS AUREUS: NEGATIVE

## 2017-10-20 SURGERY — HEMIARTHROPLASTY, HIP, DIRECT ANTERIOR APPROACH, FOR FRACTURE
Anesthesia: General | Site: Hip | Laterality: Left

## 2017-10-20 MED ORDER — FENTANYL CITRATE (PF) 100 MCG/2ML IJ SOLN
25.0000 ug | INTRAMUSCULAR | Status: DC | PRN
  Administered 2017-10-20: 50 ug via INTRAVENOUS

## 2017-10-20 MED ORDER — SUGAMMADEX SODIUM 200 MG/2ML IV SOLN
INTRAVENOUS | Status: DC | PRN
  Administered 2017-10-20: 150 mg via INTRAVENOUS

## 2017-10-20 MED ORDER — LACTATED RINGERS IV SOLN
INTRAVENOUS | Status: DC | PRN
  Administered 2017-10-20: 07:00:00 via INTRAVENOUS

## 2017-10-20 MED ORDER — PHENYLEPHRINE 40 MCG/ML (10ML) SYRINGE FOR IV PUSH (FOR BLOOD PRESSURE SUPPORT)
PREFILLED_SYRINGE | INTRAVENOUS | Status: AC
  Filled 2017-10-20: qty 10

## 2017-10-20 MED ORDER — ONDANSETRON HCL 4 MG/2ML IJ SOLN
4.0000 mg | Freq: Once | INTRAMUSCULAR | Status: AC | PRN
  Administered 2017-10-20: 4 mg via INTRAVENOUS

## 2017-10-20 MED ORDER — PROPOFOL 500 MG/50ML IV EMUL
INTRAVENOUS | Status: DC | PRN
  Administered 2017-10-20: 25 ug/kg/min via INTRAVENOUS

## 2017-10-20 MED ORDER — CEFAZOLIN SODIUM-DEXTROSE 2-4 GM/100ML-% IV SOLN
2.0000 g | Freq: Four times a day (QID) | INTRAVENOUS | Status: AC
  Administered 2017-10-20 (×2): 2 g via INTRAVENOUS
  Filled 2017-10-20 (×2): qty 100

## 2017-10-20 MED ORDER — FENTANYL CITRATE (PF) 100 MCG/2ML IJ SOLN
INTRAMUSCULAR | Status: AC
  Administered 2017-10-20: 50 ug via INTRAVENOUS
  Filled 2017-10-20: qty 2

## 2017-10-20 MED ORDER — DEXAMETHASONE SODIUM PHOSPHATE 10 MG/ML IJ SOLN
INTRAMUSCULAR | Status: AC
  Filled 2017-10-20: qty 1

## 2017-10-20 MED ORDER — ROCURONIUM BROMIDE 50 MG/5ML IV SOSY
PREFILLED_SYRINGE | INTRAVENOUS | Status: AC
  Filled 2017-10-20: qty 5

## 2017-10-20 MED ORDER — CHLORHEXIDINE GLUCONATE 4 % EX LIQD: 60.0000 mL | Freq: Once | CUTANEOUS | Status: DC

## 2017-10-20 MED ORDER — CEFAZOLIN SODIUM-DEXTROSE 2-4 GM/100ML-% IV SOLN
2.0000 g | Freq: Once | INTRAVENOUS | Status: AC
  Administered 2017-10-20: 2 g via INTRAVENOUS
  Filled 2017-10-20: qty 100

## 2017-10-20 MED ORDER — PHENYLEPHRINE HCL 10 MG/ML IJ SOLN
INTRAMUSCULAR | Status: DC | PRN
  Administered 2017-10-20: 80 ug via INTRAVENOUS

## 2017-10-20 MED ORDER — DEXAMETHASONE SODIUM PHOSPHATE 10 MG/ML IJ SOLN
INTRAMUSCULAR | Status: DC | PRN
  Administered 2017-10-20: 10 mg via INTRAVENOUS

## 2017-10-20 MED ORDER — LIDOCAINE 2% (20 MG/ML) 5 ML SYRINGE
INTRAMUSCULAR | Status: AC
  Filled 2017-10-20: qty 5

## 2017-10-20 MED ORDER — VANCOMYCIN HCL 1000 MG IV SOLR
INTRAVENOUS | Status: AC
  Filled 2017-10-20: qty 1000

## 2017-10-20 MED ORDER — ACETAMINOPHEN 10 MG/ML IV SOLN
INTRAVENOUS | Status: AC
  Filled 2017-10-20: qty 100

## 2017-10-20 MED ORDER — ENOXAPARIN SODIUM 40 MG/0.4ML ~~LOC~~ SOLN
40.0000 mg | SUBCUTANEOUS | Status: DC
  Administered 2017-10-21 – 2017-10-23 (×3): 40 mg via SUBCUTANEOUS
  Filled 2017-10-20 (×3): qty 0.4

## 2017-10-20 MED ORDER — ONDANSETRON HCL 4 MG/2ML IJ SOLN
INTRAMUSCULAR | Status: AC
  Administered 2017-10-20: 4 mg via INTRAVENOUS
  Filled 2017-10-20: qty 2

## 2017-10-20 MED ORDER — OXYCODONE HCL 5 MG/5ML PO SOLN: 5.0000 mg | Freq: Once | ORAL | Status: DC | PRN

## 2017-10-20 MED ORDER — MENTHOL 3 MG MT LOZG: 1.0000 | LOZENGE | OROMUCOSAL | Status: DC | PRN

## 2017-10-20 MED ORDER — PROPOFOL 10 MG/ML IV BOLUS
INTRAVENOUS | Status: AC
  Filled 2017-10-20: qty 20

## 2017-10-20 MED ORDER — ACETAMINOPHEN 10 MG/ML IV SOLN
INTRAVENOUS | Status: DC | PRN
  Administered 2017-10-20: 1000 mg via INTRAVENOUS

## 2017-10-20 MED ORDER — ONDANSETRON HCL 4 MG/2ML IJ SOLN
INTRAMUSCULAR | Status: AC
  Filled 2017-10-20: qty 2

## 2017-10-20 MED ORDER — 0.9 % SODIUM CHLORIDE (POUR BTL) OPTIME
TOPICAL | Status: DC | PRN
  Administered 2017-10-20: 1000 mL

## 2017-10-20 MED ORDER — DOCUSATE SODIUM 100 MG PO CAPS
100.0000 mg | ORAL_CAPSULE | Freq: Two times a day (BID) | ORAL | Status: DC
  Administered 2017-10-21 – 2017-10-22 (×3): 100 mg via ORAL
  Filled 2017-10-20 (×4): qty 1

## 2017-10-20 MED ORDER — ROCURONIUM BROMIDE 10 MG/ML (PF) SYRINGE
PREFILLED_SYRINGE | INTRAVENOUS | Status: DC | PRN
  Administered 2017-10-20: 40 mg via INTRAVENOUS

## 2017-10-20 MED ORDER — FENTANYL CITRATE (PF) 250 MCG/5ML IJ SOLN
INTRAMUSCULAR | Status: DC | PRN
  Administered 2017-10-20 (×2): 25 ug via INTRAVENOUS
  Administered 2017-10-20 (×2): 50 ug via INTRAVENOUS

## 2017-10-20 MED ORDER — CEFAZOLIN SODIUM 1 G IJ SOLR: 2.0000 g | Freq: Once | INTRAMUSCULAR | Status: DC

## 2017-10-20 MED ORDER — VANCOMYCIN HCL 1000 MG IV SOLR
INTRAVENOUS | Status: DC | PRN
  Administered 2017-10-20: 1000 mg

## 2017-10-20 MED ORDER — ONDANSETRON HCL 4 MG/2ML IJ SOLN
INTRAMUSCULAR | Status: DC | PRN
  Administered 2017-10-20: 4 mg via INTRAVENOUS

## 2017-10-20 MED ORDER — PROPOFOL 10 MG/ML IV BOLUS
INTRAVENOUS | Status: DC | PRN
  Administered 2017-10-20: 100 mg via INTRAVENOUS

## 2017-10-20 MED ORDER — LIDOCAINE 2% (20 MG/ML) 5 ML SYRINGE
INTRAMUSCULAR | Status: DC | PRN
  Administered 2017-10-20: 80 mg via INTRAVENOUS

## 2017-10-20 MED ORDER — OXYCODONE HCL 5 MG PO TABS: 5.0000 mg | ORAL_TABLET | Freq: Once | ORAL | Status: DC | PRN

## 2017-10-20 MED ORDER — FENTANYL CITRATE (PF) 250 MCG/5ML IJ SOLN
INTRAMUSCULAR | Status: AC
  Filled 2017-10-20: qty 5

## 2017-10-20 MED ORDER — PHENOL 1.4 % MT LIQD: 1.0000 | OROMUCOSAL | Status: DC | PRN

## 2017-10-20 MED ORDER — SODIUM CHLORIDE 0.9 % IV SOLN
INTRAVENOUS | Status: DC | PRN
  Administered 2017-10-20: 25 ug/min via INTRAVENOUS

## 2017-10-20 SURGICAL SUPPLY — 52 items
BLADE SAGITTAL 25.0X1.27X90 (BLADE) ×2 IMPLANT
BLADE SAGITTAL 25.0X1.27X90MM (BLADE) ×1
CHLORAPREP W/TINT 26ML (MISCELLANEOUS) ×6 IMPLANT
CLOSURE STERI-STRIP 1/2X4 (GAUZE/BANDAGES/DRESSINGS) ×1
CLSR STERI-STRIP ANTIMIC 1/2X4 (GAUZE/BANDAGES/DRESSINGS) ×2 IMPLANT
COVER SURGICAL LIGHT HANDLE (MISCELLANEOUS) ×3 IMPLANT
DRAPE INCISE IOBAN 66X45 STRL (DRAPES) ×3 IMPLANT
DRAPE ORTHO SPLIT 77X108 STRL (DRAPES) ×4
DRAPE SURG ORHT 6 SPLT 77X108 (DRAPES) ×2 IMPLANT
DRAPE U-SHAPE 47X51 STRL (DRAPES) ×3 IMPLANT
DRESSING AQUACEL AG SP 3.5X6 (GAUZE/BANDAGES/DRESSINGS) ×1 IMPLANT
DRSG AQUACEL AG SP 3.5X6 (GAUZE/BANDAGES/DRESSINGS) ×3
ELECT BLADE 4.0 EZ CLEAN MEGAD (MISCELLANEOUS) ×3
ELECT CAUTERY BLADE 6.4 (BLADE) ×3 IMPLANT
ELECT REM PT RETURN 9FT ADLT (ELECTROSURGICAL) ×3
ELECTRODE BLDE 4.0 EZ CLN MEGD (MISCELLANEOUS) ×1 IMPLANT
ELECTRODE REM PT RTRN 9FT ADLT (ELECTROSURGICAL) ×1 IMPLANT
GLOVE BIO SURGEON STRL SZ8 (GLOVE) ×6 IMPLANT
GLOVE BIOGEL PI IND STRL 8 (GLOVE) ×1 IMPLANT
GLOVE BIOGEL PI INDICATOR 8 (GLOVE) ×2
GLOVE BIOGEL PI ORTHO PRO SZ8 (GLOVE)
GLOVE PI ORTHO PRO STRL SZ8 (GLOVE) IMPLANT
GOWN STRL REUS W/ TWL LRG LVL3 (GOWN DISPOSABLE) ×3 IMPLANT
GOWN STRL REUS W/ TWL XL LVL3 (GOWN DISPOSABLE) ×3 IMPLANT
GOWN STRL REUS W/TWL 2XL LVL3 (GOWN DISPOSABLE) IMPLANT
GOWN STRL REUS W/TWL LRG LVL3 (GOWN DISPOSABLE) ×6
GOWN STRL REUS W/TWL XL LVL3 (GOWN DISPOSABLE) ×6
HEAD MODULAR ENDO (Orthopedic Implant) ×2 IMPLANT
HEAD UNPLR 48XMDLR STRL HIP (Orthopedic Implant) ×1 IMPLANT
KIT BASIN OR (CUSTOM PROCEDURE TRAY) ×3 IMPLANT
KIT TURNOVER KIT B (KITS) ×3 IMPLANT
MANIFOLD NEPTUNE II (INSTRUMENTS) ×3 IMPLANT
NEEDLE MAYO TROCAR (NEEDLE) ×3 IMPLANT
PACK TOTAL JOINT (CUSTOM PROCEDURE TRAY) ×3 IMPLANT
PAD ARMBOARD 7.5X6 YLW CONV (MISCELLANEOUS) ×6 IMPLANT
PILLOW ABDUCTION HIP (SOFTGOODS) ×6 IMPLANT
RETRIEVER SUT HEWSON (MISCELLANEOUS) ×3 IMPLANT
SLEEVE UNITRAX V40 (Orthopedic Implant) ×2 IMPLANT
SLEEVE UNITRAX V40 +4 (Orthopedic Implant) ×1 IMPLANT
STAPLER VISISTAT 35W (STAPLE) IMPLANT
STEM HIP 4 127DEG (Stem) ×3 IMPLANT
SUT FIBERWIRE #2 38 REV NDL BL (SUTURE) ×12
SUT MON AB 3-0 SH 27 (SUTURE) ×2
SUT MON AB 3-0 SH27 (SUTURE) ×1 IMPLANT
SUT VIC AB 0 CT1 27 (SUTURE) ×4
SUT VIC AB 0 CT1 27XBRD ANBCTR (SUTURE) ×2 IMPLANT
SUT VIC AB 2-0 CT1 27 (SUTURE) ×4
SUT VIC AB 2-0 CT1 TAPERPNT 27 (SUTURE) ×2 IMPLANT
SUTURE FIBERWR#2 38 REV NDL BL (SUTURE) ×4 IMPLANT
TAPE STRIPS DRAPE STRL (GAUZE/BANDAGES/DRESSINGS) ×3 IMPLANT
TRAY CATH 16FR W/PLASTIC CATH (SET/KITS/TRAYS/PACK) ×3 IMPLANT
TRAY FOLEY W/BAG SLVR 14FR (SET/KITS/TRAYS/PACK) IMPLANT

## 2017-10-20 NOTE — Anesthesia Postprocedure Evaluation (Signed)
Anesthesia Post Note  Patient: Amber [name]  Procedure(s) Performed: ARTHROPLASTY BIPOLAR HIP (HEMIARTHROPLASTY) (Left Hip)     Patient location during evaluation: PACU Anesthesia Type: General Level of consciousness: awake and alert Pain management: pain level controlled Vital Signs Assessment: post-procedure vital signs reviewed and stable Respiratory status: spontaneous breathing, nonlabored ventilation and respiratory function stable Cardiovascular status: blood pressure returned to baseline and stable Postop Assessment: no apparent nausea or vomiting Anesthetic complications: no    Last Vitals:  Vitals:   10/20/17 1038 10/20/17 1039  BP:    Pulse:  74  Resp: 14 12  Temp:  (!) 36.3 C  SpO2: 98% 99%    Last Pain:  Vitals:   10/20/17 1030  TempSrc:   PainSc: 0-No pain                 Audry Pili

## 2017-10-20 NOTE — Progress Notes (Signed)
PROGRESS NOTE  Amber [name] TSV:[id number] DOB: 1932/03/29 DOA: 10/19/2017 PCP: Heywood Bene, PA-C  HPI/Recap of past 24 hours:  Patient is seen after returned from [place] She is still drowsy but able to provide history, she denies pain currently  Assessment/Plan: Principal Problem:   Proximal Femur Fracture, left /Mechanical Fall Active Problems:   Hypertension   Hypothyroidism   Femur fracture ([place])  Left femoral neck fracture - status post mechanical fall from chronic dizziness ("States she is dizzy all the time and this morning was no different than her baseline." per EDP on presentation) -s/pL hip hemiarthroplasty on 9/7 by ortho Dr Griffin Basil -weightbearing as tolerated with posterior hip precautions.  DVT prophylaxis Lovenox 40 mg daily.  -plan per Dr Griffin Basil  Low b12, continue b12 supplement  Chronic mild thrombocytopenia Stable at baseline  CKDII Stable at baseline  HTN/HLD, home meds benazepril/ statin/fish oil  Hypothyroidism on synthroid  Code Status: full  Family Communication: patient   Disposition Plan: pending PT eval, likely need snf  She prefers [facility]  Consultants:  ortho  Procedures: s/pL hip hemiarthroplasty on 9/7 by ortho Dr Griffin Basil  Antibiotics:  perioperative   Objective: BP (!) 132/53   Pulse 74   Temp (!) 97.3 F (36.3 C)   Resp 12   Ht 5\' 8"  (1.727 m)   Wt 67.1 kg   SpO2 99%   BMI 22.50 kg/m   Intake/Output Summary (Last 24 hours) at 10/20/2017 1229 Last data filed at 10/20/2017 1035 Gross per 24 hour  Intake 602.73 ml  Output 1100 ml  Net -497.27 ml   Filed Weights   10/19/17 1110  Weight: 67.1 kg    Exam: Patient is examined daily including today on 10/20/2017, exams remain the same as of yesterday except that has changed    General:  NAD, drowsy but able to provide reliable history  Cardiovascular: RRR  Respiratory: CTABL  Abdomen: Soft/ND/NT, positive BS  Musculoskeletal: left leg post op  changes  Neuro: oriented   Data Reviewed: Basic Metabolic Panel: Recent Labs  Lab 10/19/17 1126 10/20/17 1124  NA 143  --   K 3.8  --   CL 109  --   CO2 27  --   GLUCOSE 101*  --   BUN 15  --   CREATININE 1.01* 1.00  CALCIUM 9.0  --    Liver Function Tests: Recent Labs  Lab 10/19/17 1126  AST 19  ALT 13  ALKPHOS 58  BILITOT 0.7  PROT 7.0  ALBUMIN 3.5   No results for input(s): LIPASE, AMYLASE in the last 168 hours. No results for input(s): AMMONIA in the last 168 hours. CBC: Recent Labs  Lab 10/19/17 1126 10/20/17 1124  WBC 4.9 8.2  NEUTROABS 4.0  --   HGB 12.2 12.6  HCT 36.2 38.7  MCV 88.5 91.1  PLT 170 143*   Cardiac Enzymes:   No results for input(s): CKTOTAL, CKMB, CKMBINDEX, TROPONINI in the last 168 hours. BNP (last 3 results) No results for input(s): BNP in the last 8760 hours.  ProBNP (last 3 results) No results for input(s): PROBNP in the last 8760 hours.  CBG: Recent Labs  Lab 10/19/17 1130  GLUCAP 96    Recent Results (from the past 240 hour(s))  Surgical pcr screen     Status: None   Collection Time: 10/20/17  2:48 AM  Result Value Ref Range Status   MRSA, PCR NEGATIVE NEGATIVE Final   Staphylococcus aureus NEGATIVE NEGATIVE Final  Comment: (NOTE) The Xpert SA Assay (FDA approved for NASAL specimens in patients 64 years of age and older), is one component of a comprehensive surveillance program. It is not intended to diagnose infection nor to guide or monitor treatment. Performed at [facility], [place] [address], [place], [place] [address]      Studies: Pelvis Portable  Result Date: 10/20/2017 CLINICAL DATA:  Left hip replacement EXAM: PORTABLE PELVIS 1-2 VIEWS COMPARISON:  None. FINDINGS: Patient is status post left hip replacement. Soft tissue gas is consistent with the recent surgery. Hardware is in good position. A more remote right hip replacement is identified as well. IMPRESSION: Left hip replacement as above.  Electronically Signed   By: Dorise Bullion III M.D   On: 10/20/2017 11:35   Dg Hip Unilat W Or Wo Pelvis 2-3 Views Left  Result Date: 10/19/2017 CLINICAL DATA:  Status post fall. Painful left hip. History of previous right hip replacement following fracture. EXAM: DG HIP (WITH OR WITHOUT PELVIS) 2-3V LEFT COMPARISON:  Right hip series of January 19, 2016 FINDINGS: There is an acute fracture of the base of the neck of the left femur. The femoral head remains appropriately position within the acetabulum. No intertrochanteric fracture is observed. The sub trochanteric region is normal. There is a prosthetic right hip. No pelvic fracture is observed. IMPRESSION: Acute fracture involving the base of the neck of the left femur. Previous right hip replacement. Electronically Signed   By: David  Martinique M.D.   On: 10/19/2017 12:51   Dg Femur Min 2 Views Left  Result Date: 10/19/2017 CLINICAL DATA:  Status post fall with painful left hip. EXAM: LEFT FEMUR 2 VIEWS COMPARISON:  AP pelvis dated January 20, 2016 FINDINGS: The patient has sustained an acute fracture of the base and mid shaft of the neck of the left femur. The femoral head remains appropriately positioned with respect to the acetabulum. The intertrochanteric and subtrochanteric regions are normal. The observed portions of the left knee are normal for age. IMPRESSION: Acute fracture involving the mid and basilar portions of the left femoral neck without an intertrochanteric component. No dislocation of the femoral head. Electronically Signed   By: David  Martinique M.D.   On: 10/19/2017 12:53    Scheduled Meds: . aspirin EC  81 mg Oral Daily  . benazepril  20 mg Oral Daily  . docusate sodium  100 mg Oral BID  . [START ON 10/21/2017] enoxaparin (LOVENOX) injection  40 mg Subcutaneous Q24H  . famotidine  20 mg Oral Daily  . levothyroxine  75 mcg Oral QAC breakfast  . multivitamin with minerals  1 tablet Oral Daily  . omega-3 acid ethyl esters  1 g Oral  Daily  . pravastatin  20 mg Oral q1800  . sodium chloride flush  3 mL Intravenous Q12H  . vitamin B-12  1,000 mcg Oral Daily    Continuous Infusions: . sodium chloride 250 mL (10/19/17 2306)  .  ceFAZolin (ANCEF) IV       Time spent: 25 mins I have personally reviewed and interpreted on  10/20/2017 daily labs, imagings as discussed above under date review session and assessment and plans.  I reviewed all nursing notes, pharmacy notes, consultant notes,  vitals, pertinent old records  I have discussed plan of care as described above with RN , patient  on 10/20/2017   Florencia Reasons MD, PhD  [facility] Pager [phone]. If 7PM-7AM, please contact night-coverage at [email or web address], password [facility] 10/20/2017, 12:29 PM  LOS: 1 day

## 2017-10-20 NOTE — Op Note (Signed)
Orthopaedic Surgery Operative Note (CSN: [id number])  Amber [name]  24-Sep-1932 Date of Surgery: 10/19/2017 - 10/20/2017   Diagnoses:  Left femoral neck fracture  Procedure: 27236 - L hip hemiarthroplasty   Operative Finding Successful completion of planned procedure.  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  She had obvious massive quadriceps weakness secondary to previous sarcoma resection.  High risk of infection, very prosthetic fracture as well as dislocation in the setting of her previous surgeries and treatment  Post-operative plan: The patient will be readmitted to floor.  The patient will be weightbearing as tolerated with posterior hip precautions.  DVT prophylaxis Lovenox 40 mg daily.  Pain control with PRN pain medication preferring oral medicines.  Follow up plan will be scheduled in approximately 14 days for incision check and XR.  Post-Op Diagnosis: Same Surgeons:Primary: Hiram Gash, MD Assistants: Joya Gaskins, OPAC Location[facility] OR ROOM 07 Anesthesia: General Antibiotics: Ancef 2g preop, Vancomycin thousand mg locally Tourniquet time: * No tourniquets in log * Estimated Blood Loss: 782 Complications: None Specimens: None Implants: Implant Name Type Inv. Item Serial No. Manufacturer Lot No. LRB No. Used Action  SLEEVE UNITRAX V40 - [id number] Orthopedic Implant Anette Guarneri V40 [id number] [facility] ORTHOPEDICS [id number] Left 1 Implanted  HEAD MODULAR ENDO - [id number] Orthopedic Implant HEAD MODULAR ENDO [id number] [facility] ORTHOPEDICS [id number] Left 1 Implanted  STEM HIP 4 127DEG - [id number] Stem STEM HIP 4 127DEG [id number] [employer] [id number] Left 1 Implanted    Indications for Surgery:   Amber [name] is a 84 y.o. female with previous history of left-sided sarcoma resection and right-sided hip hemiarthroplasty with fall and displaced left femoral neck fracture.  Benefits and risks of operative and nonoperative management were discussed prior to surgery  with patient/guardian(s) and informed consent form was completed.  Specific risks including infection, need for additional surgery, and increased risk of dislocation, periprosthetic fracture and infection secondary to previous surgeries and osteopenia and chemotherapy.   Procedure:   The patient was identified in the preoperative holding area where the surgical site was marked. The patient was taken to the OR where a procedural timeout was called and the above noted anesthesia was induced.  The patient was positioned lateral with a marked to positioner.  Preoperative antibiotics were dosed.  The patient's left leg was prepped and draped in the usual sterile fashion.  A second preoperative timeout was called.      We made an incision centered over the greater trochanter with a scalpel. We used the scalpel to continue to dissect to the fascia. The fascia was pierced with Bovie electrocautery. Mayo scissors were used to cut the fascia in a longitudinal fashion. The gluteus maximus fibers were bluntly split in line with their fibers. The femur was slowly internally rotated, putting tension on the posterior structures. Bovie electrocautery was used to dissect the short external rotators off of the insertion onto the femur. After the short external rotators were transected, we visualized the femoral neck fracture. We identified the sciatic nerve by palpation and verified that it was not in danger from dissection. We made a T-shaped capsulotomy. We made a neck cut approximately 1 cm above the lesser trochanter with a a reciprocating saw. We removed the femoral head. We used the box cutter to cut away some of the greater trochanter for ease of insertion of the stem. We then used the canal finder to locate the femoral canal. Using the angle of the femoral neck as our  guide for version, we broached sequentially. We trialed components and found appropriate fit and stability.  The patient would come to full extension of  the hip. The hip was stable at 90 degrees with abduction and internal rotation to 70 degrees.  We then removed the trials as well as the broach. We ensured there were no foreign bodies or bone within the acetabulum. We copiously irrigated the wound.  We then carefully placed our stem, size listed above before assembling the head and neck together onto the stem. We then reduced the hip. Again, that was stable in the previously mentioned manipulations. We repaired the posterior capsule. Short external rotators repaired to the greater trochanter. We closed the fascia of the iliotibial band and gluteus maximus with running and intterupted Vicryl sutures. We then closed Scarpa's fascia with running Vicryl sutures. Skin was closed with Vicryl and the subcutaneous tissue and Monocryl running in the skin.   Joya Gaskins, OPA-C, present and scrubbed throughout the case, critical for completion in a timely fashion, and for retraction, instrumentation, closure.

## 2017-10-20 NOTE — Progress Notes (Signed)
Received report from off going nurse. Pt assessed and updated dautghter Amber [name].

## 2017-10-20 NOTE — Consult Note (Signed)
ORTHOPAEDIC CONSULTATION  REQUESTING PHYSICIAN: Florencia Reasons, MD  Chief Complaint: L hip pain  HPI: Amber [name] is a 84 y.o. female with acute traumatic fall from standing resulting in left hip pain and inability to ambulate.  No LOC, at baseline state of health prior to fall.  Patient occasionally walks with assistive device prior.  Reports significant pain at site of injury, no other areas of pain reported acutely.   Of note she had a right hip hemiarthroplasty performed by Dr. Fredonia Highland in 2017.  She is interested in someone from his practice like myself performing the left hip.  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  She had reportedly quadriceps removed due to this.  She had chemotherapy in this leg as well infused in the posterior aspect of her thigh she states.  Past Medical History:  Diagnosis Date  . Complication of anesthesia    "woke up jumping and bouncing"  . Hyperlipidemia   . Hypertension   . 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 Thyroid disease    Past Surgical History:  Procedure Laterality Date  . ABDOMINAL HYSTERECTOMY    . CHOLECYSTECTOMY N/A 03/27/2013   Procedure: LAPAROSCOPIC CHOLECYSTECTOMY WITH INTRAOPERATIVE CHOLANGIOGRAM;  Surgeon: Pedro Earls, MD;  Location: [facility];  Service: General;  Laterality: N/A;  . HIP ARTHROPLASTY Right 01/20/2016   Procedure: ARTHROPLASTY BIPOLAR HIP (HEMIARTHROPLASTY);  Surgeon: Renette Butters, MD;  Location: [facility];  Service: Orthopedics;  Laterality: Right;  . KNEE SURGERY Left    approx 4 surgeries on left knee   Social History   Socioeconomic History  . Marital status: Married    Spouse name: Deidre Ala  . Number of children: 1  . Years of education: Not on file  . Highest education level: Not on file  Occupational History  . Not on file  Social Needs  . Financial resource strain: Not on file  . Food insecurity:    Worry: Not on file    Inability:  Not on file  . Transportation needs:    Medical: Not on file    Non-medical: Not on file  Tobacco Use  . Smoking status: Former Smoker    Last attempt to quit: 03/27/1983    Years since quitting: 34.5  . Smokeless tobacco: Never Used  Substance and Sexual Activity  . Alcohol use: No  . Drug use: No  . Sexual activity: Not on file  Lifestyle  . Physical activity:    Days per week: Not on file    Minutes per session: Not on file  . Stress: Not on file  Relationships  . Social connections:    Talks on phone: Not on file    Gets together: Not on file    Attends religious service: Not on file    Active member of club or organization: Not on file    Attends meetings of clubs or organizations: Not on file    Relationship status: Not on file  Other Topics Concern  . Not on file  Social History Narrative   Lives with husband    Caffeine use: Coffee and diet soda    Right handed    Family History  Problem Relation Age of Onset  . Lung cancer Sister   . Bone cancer Brother   . Stomach cancer Sister    No Known Allergies Prior to Admission  medications   Medication Sig Start Date End Date Taking? Authorizing Provider  aspirin EC 81 MG tablet Take 81 mg by mouth daily.   Yes [provider]  benazepril (LOTENSIN) 20 MG tablet Take 20 mg by mouth daily. 06/04/17  Yes [provider]  levothyroxine (SYNTHROID, LEVOTHROID) 75 MCG tablet Take 75 mcg by mouth daily before breakfast.   Yes [provider]  lovastatin (MEVACOR) 20 MG tablet Take 20 mg by mouth at bedtime. 11/17/15  Yes [provider]  Multiple Vitamins-Minerals (MULTIVITAMIN PO) Take 1 tablet by mouth daily.   Yes [provider]  naproxen sodium (ANAPROX) 220 MG tablet Take 220 mg by mouth daily as needed (aches, pains, sleep).   Yes [provider]  Omega-3 Fatty Acids (FISH OIL OMEGA-3 PO) Take 1 capsule by mouth every evening.   Yes [provider]    ranitidine (ZANTAC) 150 MG tablet Take 150 mg by mouth daily as needed for heartburn.    Yes [provider]  vitamin B-12 (CYANOCOBALAMIN) 1000 MCG tablet Take 1,000 mcg by mouth daily.   Yes [provider]  HYDROcodone-acetaminophen (NORCO/VICODIN) 5-325 MG tablet Take 1 tablet by mouth every 6 (six) hours as needed for moderate pain. Patient not taking: Reported on 10/19/2017 11/23/16   Orpah Greek, MD   Dg Hip Unilat W Or Wo Pelvis 2-3 Views Left  Result Date: 10/19/2017 CLINICAL DATA:  Status post fall. Painful left hip. History of previous right hip replacement following fracture. EXAM: DG HIP (WITH OR WITHOUT PELVIS) 2-3V LEFT COMPARISON:  Right hip series of January 19, 2016 FINDINGS: There is an acute fracture of the base of the neck of the left femur. The femoral head remains appropriately position within the acetabulum. No intertrochanteric fracture is observed. The sub trochanteric region is normal. There is a prosthetic right hip. No pelvic fracture is observed. IMPRESSION: Acute fracture involving the base of the neck of the left femur. Previous right hip replacement. Electronically Signed   By: David  Martinique M.D.   On: 10/19/2017 12:51   Dg Femur Min 2 Views Left  Result Date: 10/19/2017 CLINICAL DATA:  Status post fall with painful left hip. EXAM: LEFT FEMUR 2 VIEWS COMPARISON:  AP pelvis dated January 20, 2016 FINDINGS: The patient has sustained an acute fracture of the base and mid shaft of the neck of the left femur. The femoral head remains appropriately positioned with respect to the acetabulum. The intertrochanteric and subtrochanteric regions are normal. The observed portions of the left knee are normal for age. IMPRESSION: Acute fracture involving the mid and basilar portions of the left femoral neck without an intertrochanteric component. No dislocation of the femoral head. Electronically Signed   By: David  Martinique M.D.   On: 10/19/2017 12:53   Family  History Reviewed and non-contributory, no pertinent history of problems with bleeding or anesthesia      Review of Systems 14 system ROS conducted and negative except for that noted in HPI   OBJECTIVE  Vitals: Patient Vitals for the past 8 hrs:  BP Temp Temp src Pulse SpO2  10/20/17 0435 (!) 110/57 98 F (36.7 C) Oral 68 97 %   General: Alert, no acute distress Cardiovascular: No pedal edema Respiratory: No cyanosis, no use of accessory musculature GI: No organomegaly, abdomen is soft and non-tender Skin: No lesions in the area of chief complaint other than those listed below in MSK exam.  Neurologic: Sensation intact distally save for the  below mentioned MSK exam Psychiatric: Patient is competent for consent with normal mood and affect Lymphatic: No axillary or cervical lymphadenopathy Extremities   CHE:KBTCYELYH and externally rotated.  Patient has a large anterior incision over the quadriceps.  Were unable to really identify any lesions on the posterior aspect of the hip secondary to patient discomfort with attempting to move the leg.  ROM deferred. + GS/TA/EHL. Sensation intact in DP/SP/S/S/P distributions. 2+ DP pulse with warm and well perfused digits. Compartments soft and compressible, with no pain on passive stretch.     Test Results Imaging X-rays demonstrate displaced left femoral neck fracture and a previously placed right hip hemiarthroplasty  Labs cbc Recent Labs    10/19/17 1126  WBC 4.9  HGB 12.2  HCT 36.2  PLT 170    Labs inflam No results for input(s): CRP in the last 72 hours.  Invalid input(s): ESR  Labs coag No results for input(s): INR, PTT in the last 72 hours.  Invalid input(s): PT  Recent Labs    10/19/17 1126  NA 143  K 3.8  CL 109  CO2 27  GLUCOSE 101*  BUN 15  CREATININE 1.01*  CALCIUM 9.0     ASSESSMENT AND PLAN: 84 y.o. female with the following: Left femoral neck fracture  Discussed the nature of the injury as well  as the care with the patient as well as the family.  Nonoperative measures are not well tolerated as patient's on bedrest for extended periods of time tend to develop secondary issues such as pneumonia, urinary tract infections, bedsores and delirium.  Based on this our recommendation is for operative measures.  The risks benefits and alternatives were discussed with the patient including but not limited to the risks of nonoperative treatment, versus surgical intervention including infection, bleeding, nerve injury, periprosthetic fracture, the need for revision surgery, dislocation, leg length discrepancy, gait change, blood clots, cardiopulmonary complications, morbidity, mortality, among others, and they were willing to proceed.    Patient's risk of dislocation as well as weakness much increased and likely increased risk of infection secondary to her previous surgeries.  She understands

## 2017-10-20 NOTE — Transfer of Care (Signed)
Immediate Anesthesia Transfer of Care Note  Patient: Amber [name]  Procedure(s) Performed: ARTHROPLASTY BIPOLAR HIP (HEMIARTHROPLASTY) (Left Hip)  Patient Location: PACU  Anesthesia Type:General  Level of Consciousness: awake  Airway & Oxygen Therapy: Patient Spontanous Breathing  Post-op Assessment: Report given to RN and Post -op Vital signs reviewed and stable  Post vital signs: Reviewed and stable  Last Vitals:  Vitals Value Taken Time  BP 151/72 10/20/2017  9:36 AM  Temp    Pulse 81 10/20/2017  9:37 AM  Resp 15 10/20/2017  9:37 AM  SpO2 97 % 10/20/2017  9:37 AM  Vitals shown include unvalidated device data.  Last Pain:  Vitals:   10/20/17 0435  TempSrc: Oral  PainSc:       Patients Stated Pain Goal: 2 (85/27/78 2423)  Complications: No apparent anesthesia complications

## 2017-10-20 NOTE — Anesthesia Procedure Notes (Signed)
Procedure Name: Intubation Date/Time: 10/20/2017 8:06 AM Performed by: Clearnce Sorrel, CRNA Pre-anesthesia Checklist: Patient identified, Emergency Drugs available, Suction available, Patient being monitored and Timeout performed Patient Re-evaluated:Patient Re-evaluated prior to induction Oxygen Delivery Method: Circle system utilized Preoxygenation: Pre-oxygenation with 100% oxygen Induction Type: IV induction Ventilation: Mask ventilation without difficulty and Oral airway inserted - appropriate to patient size Laryngoscope Size: Mac and 3 Grade View: Grade II Tube type: Oral Tube size: 7.0 mm Number of attempts: 1 Airway Equipment and Method: Stylet Placement Confirmation: ETT inserted through vocal cords under direct vision,  positive ETCO2 and breath sounds checked- equal and bilateral Secured at: 20 cm Tube secured with: Tape Dental Injury: Teeth and Oropharynx as per pre-operative assessment

## 2017-10-21 ENCOUNTER — Inpatient Hospital Stay (HOSPITAL_COMMUNITY)
Admission: EM | Disposition: A | Discharge status: Skilled Nursing Facility | Payer: Self-pay | Source: Home / Self Care | Attending: Internal Medicine

## 2017-10-21 DIAGNOSIS — Z66 Do not resuscitate: Secondary | ICD-10-CM

## 2017-10-21 LAB — CBC
HEMATOCRIT: 35.6 % — AB (ref 36.0–46.0)
Hemoglobin: 11.7 g/dL — ABNORMAL LOW (ref 12.0–15.0)
MCH: 29.8 pg (ref 26.0–34.0)
MCHC: 32.9 g/dL (ref 30.0–36.0)
MCV: 90.8 fL (ref 78.0–100.0)
Platelets: 160 10*3/uL (ref 150–400)
RBC: 3.92 MIL/uL (ref 3.87–5.11)
RDW: 12.4 % (ref 11.5–15.5)
WBC: 7.3 10*3/uL (ref 4.0–10.5)

## 2017-10-21 LAB — BASIC METABOLIC PANEL
ANION GAP: 8 (ref 5–15)
BUN: 12 mg/dL (ref 8–23)
CO2: 26 mmol/L (ref 22–32)
Calcium: 8.6 mg/dL — ABNORMAL LOW (ref 8.9–10.3)
Chloride: 107 mmol/L (ref 98–111)
Creatinine, Ser: 1 mg/dL (ref 0.44–1.00)
GFR calc non Af Amer: 51 mL/min — ABNORMAL LOW (ref >60)
GFR, EST AFRICAN AMERICAN: 59 mL/min — AB (ref >60)
Glucose, Bld: 111 mg/dL — ABNORMAL HIGH (ref 70–99)
Potassium: 4.4 mmol/L (ref 3.5–5.1)
Sodium: 141 mmol/L (ref 135–145)

## 2017-10-21 SURGERY — IRRIGATION AND DEBRIDEMENT EXTREMITY
Anesthesia: General | Laterality: Left

## 2017-10-21 MED ORDER — ACETAMINOPHEN 500 MG PO TABS: 1000.0000 mg | ORAL_TABLET | Freq: Three times a day (TID) | ORAL | 0 refills | Status: AC

## 2017-10-21 MED ORDER — ONDANSETRON HCL 4 MG PO TABS: 4.0000 mg | ORAL_TABLET | Freq: Three times a day (TID) | ORAL | 1 refills | Status: AC | PRN

## 2017-10-21 MED ORDER — ENOXAPARIN SODIUM 40 MG/0.4ML ~~LOC~~ SOLN: 40.0000 mg | SUBCUTANEOUS | 1 refills | Status: DC

## 2017-10-21 MED ORDER — OXYCODONE HCL 5 MG PO TABS: ORAL_TABLET | ORAL | 0 refills | Status: DC

## 2017-10-21 NOTE — Plan of Care (Signed)
Acute rehab goals established for patient.

## 2017-10-21 NOTE — Plan of Care (Signed)
  Problem: Activity: Goal: Risk for activity intolerance will decrease Outcome: Progressing   Problem: Pain Managment: Goal: General experience of comfort will improve Outcome: Progressing   Problem: Safety: Goal: Ability to remain free from injury will improve Outcome: Progressing   

## 2017-10-21 NOTE — Evaluation (Signed)
Physical Therapy Evaluation Patient Details Name: Amber [name] MRN: [id number] DOB: Sep 16, 1932 Today's Date: 10/21/2017   History of Present Illness  84 y.o. female with past medical history relevant for hypertension, history of falls, dyslipidemia and hypothyroidism who presents following a fall resulting in Lt hip fracture. S/p Lt hip ARTHROPLASTY BIPOLAR HIP (HEMIARTHROPLASTY.)  Clinical Impression  Patient is s/p above surgery, presenting with functional limitations due to the deficits listed below (see PT Problem List). Previously independent, living at home with her husband who is currently recovering from a surgery. Pt is highly motivated to work with therapy today. Mod assist for most mobility. Tolerated fair amount of weight-bearing through LLE. 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 Ambulated up to 5 feet to chair with min assist and RW for support. Reviewed precautions for posterior hip. Patient would likely benefit from CIR given her prior independence and motivation. If not a candidate for CIR, she will most likely need SNF placement until she recovers her independence and safety. Patient will benefit from skilled PT to increase their independence and safety with mobility to allow discharge to the venue listed below.       Follow Up Recommendations CIR    Equipment Recommendations  (TBD next venue of care.)    Recommendations for Other Services OT consult     Precautions / Restrictions Precautions Precautions: Posterior Hip;Fall Precaution Booklet Issued: Yes (comment) Precaution Comments: Reviewed with pt and daughter Required Braces or Orthoses: (hip abduction pillow) Restrictions Weight Bearing Restrictions: Yes LLE Weight Bearing: Weight bearing as tolerated      Mobility  Bed Mobility Overal bed mobility: Needs Assistance Bed Mobility: Supine to Sit     Supine to sit: Mod assist;HOB elevated     General bed mobility comments: Mod  assist for trucal and LLE support, max VC for technique and safety precautions for posterior hip.  Transfers Overall transfer level: Needs assistance Equipment used: Rolling Sproule (2 wheeled) Transfers: Sit to/from Stand Sit to Stand: Mod assist         General transfer comment: Mod assist for boost to stand. Cues for posterior hip precautions.   Ambulation/Gait Ambulation/Gait assistance: Min assist Gait Distance (Feet): 5 Feet Assistive device: Rolling Wingler (2 wheeled) Gait Pattern/deviations: Step-to pattern;Decreased stride length;Decreased weight shift to left;Antalgic;Trunk flexed Gait velocity: slow Gait velocity interpretation: <1.31 ft/sec, indicative of household ambulator General Gait Details: Educated on safe DME use with a rolling [place]. VC for sequencing and foot placement. Min assist for [place] placement and balance. Able to bear fair amount of weight on LLE. Locks Lt knee at baseline due to hx of quad weakness.  Stairs            Wheelchair Mobility    Modified Rankin (Stroke Patients Only)       Balance Overall balance assessment: Needs assistance;History of Falls Sitting-balance support: No upper extremity supported Sitting balance-Leahy Scale: Fair     Standing balance support: Single extremity supported Standing balance-Leahy Scale: Poor                               Pertinent Vitals/Pain Pain Assessment: Faces Faces Pain Scale: Hurts a little bit Pain Location: Lt hip Pain Descriptors / Indicators: Operative site guarding Pain Intervention(s): Limited activity within patient's tolerance;Monitored during session;Repositioned    Home Living Family/patient expects to be discharged to:: Unsure Living Arrangements: Spouse/significant other Available Help at Discharge:  Family(Husband is recovering from surgery) Type of Home: House Home Access: Stairs to enter(threshold about 4") Entrance Stairs-Rails: None Entrance  Stairs-Number of Steps: 1(4" threshold) Home Layout: Two level;Able to live on main level with bedroom/bathroom Home Equipment: Tub bench;Grab bars - tub/shower;Hand held shower head;Bedside commode;Cane - single point      Prior Function Level of Independence: Independent         Comments: Reports Hx of dizziness for 1 year.(Occasionally uses a SPC)     Hand Dominance        Extremity/Trunk Assessment   Upper Extremity Assessment Upper Extremity Assessment: Defer to OT evaluation    Lower Extremity Assessment Lower Extremity Assessment: LLE deficits/detail LLE Deficits / Details: Weakness and ROM limitation as expected post-op. Additional weakness due to hx of LLE sarcoma and muscle loss of Quad.       Communication   Communication: HOH  Cognition Arousal/Alertness: Awake/alert Behavior During Therapy: WFL for tasks assessed/performed Overall Cognitive Status: Within Functional Limits for tasks assessed                                        General Comments General comments (skin integrity, edema, etc.): Daughter present, reviewed precautions and d/c planning.    Exercises Total Joint Exercises Ankle Circles/Pumps: AROM;Both;10 reps;Supine Quad Sets: Strengthening;Both;10 reps;Supine Gluteal Sets: Strengthening;Both;10 reps;Seated Heel Slides: Left;5 reps;Supine;AAROM Hip ABduction/ADduction: AAROM;Left;5 reps;Supine Long Arc Quad: AAROM;Left;5 reps;Seated   Assessment/Plan    PT Assessment Patient needs continued PT services  PT Problem List Decreased strength;Decreased range of motion;Decreased activity tolerance;Decreased balance;Decreased mobility;Decreased knowledge of use of DME;Decreased knowledge of precautions;Pain       PT Treatment Interventions DME instruction;Gait training;Functional mobility training;Therapeutic activities;Therapeutic exercise;Balance training;Neuromuscular re-education;Patient/family education    PT Goals  (Current goals can be found in the Care Plan section)  Acute Rehab PT Goals Patient Stated Goal: Get well so I can go home PT Goal Formulation: With patient Time For Goal Achievement: 11/04/17 Potential to Achieve Goals: Good    Frequency Min 3X/week   Barriers to discharge Decreased caregiver support Husband is >67 yrs old and recovering from surgery.    Co-evaluation               AM-PAC PT "6 Clicks" Daily Activity  Outcome Measure Difficulty turning over in bed (including adjusting bedclothes, sheets and blankets)?: A Lot Difficulty moving from lying on back to sitting on the side of the bed? : A Lot Difficulty sitting down on and standing up from a chair with arms (e.g., wheelchair, bedside commode, etc,.)?: A Lot Help needed moving to and from a bed to chair (including a wheelchair)?: A Little Help needed walking in hospital room?: A Little Help needed climbing 3-5 steps with a railing? : Total 6 Click Score: 13    End of Session Equipment Utilized During Treatment: Gait belt Activity Tolerance: Patient tolerated treatment well Patient left: in chair;with call bell/phone within reach;with chair alarm set;with family/visitor present;with SCD's reapplied Nurse Communication: Mobility status PT Visit Diagnosis: Other abnormalities of gait and mobility (R26.89);Muscle weakness (generalized) (M62.81);History of falling (Z91.81);Difficulty in walking, not elsewhere classified (R26.2);Pain Pain - Right/Left: Left Pain - part of body: Hip    Time: 4742-5956 PT Time Calculation (min) (ACUTE ONLY): 43 min   Charges:   PT Evaluation $PT Eval Moderate Complexity: 1 Mod PT Treatments $Therapeutic Exercise: 8-22 mins $Therapeutic Activity: 8-22 mins  [place], [place]   Ellouise Newer 10/21/2017, 1:17 PM

## 2017-10-21 NOTE — Progress Notes (Signed)
ORTHOPAEDIC PROGRESS NOTE  s/p Procedure(s): ARTHROPLASTY BIPOLAR HIP (HEMIARTHROPLASTY)  SUBJECTIVE: Reports mild pain about operative site. No chest pain. No SOB. No nausea/vomiting. No other complaints.  OBJECTIVE: PE: LLE: incision CDI, abduction pillow in place, leg lengths equal, warm well perfused foot, intact EHL/TA/GSC  Vitals:   10/21/17 0012 10/21/17 0443  BP: (!) 144/70 (!) 154/61  Pulse: 69 66  Resp: 16 16  Temp: (!) 97.5 F (36.4 C) 97.8 F (36.6 C)  SpO2: 100% 100%     ASSESSMENT: Amber [name] is a 84 y.o. female doing well postoperatively.  PLAN: Weightbearing: WBAT LLE Insicional and dressing care: OK to remove dressings POD7 and leave open to air with dry gauze PRN Orthopedic device(s): None Showering: prn VTE prophylaxis: Lovenox 40mg  qd x6 weeks Pain control: prn meds Follow - up plan: 2 weeks Contact information:  Weekdays 8-5 Ophelia Charter MD [phone], After hours and holidays please check [email or web address] for group call information for [facility]

## 2017-10-21 NOTE — Plan of Care (Signed)

## 2017-10-21 NOTE — Discharge Instructions (Signed)
Markail Diekman MD, MPH °[facility] °[address], [address] °[phone] (tel)   °[phone] (fax) ° °POST-OPERATIVE INSTRUCTIONS ° °WOUND CARE °? You may remove the operative dressing 7 days postop and leave open to air afterwards °? KEEP THE INCISIONS CLEAN AND DRY. °? You may shower on Post-Op Day #2. Gently pat the area dry.  The dressing is waterproof. Do not soak the knee in water. Do not go swimming in the pool or ocean until 4 weeks after surgery or when otherwise instructed. ° °EXERCISES °? You may bear weight as tolerated on your operative extremity. °? When in bed and while sleeping it is imperative you wear your knee immobilizer until we instruct you otherwise while in clinic. °? It is essential that you follow your posterior hip precautions.  °o Do not sit in a low chair. (No hip flexion >90 degrees) °o Do not cross your legs.  (No Adduction past neutral) °o Keep your toes pointed straight ahead at all times (No internal rotation or excessive external rotation) °? Therapy may be performed by an outpatient therapist or at a rehab facility ° °POST-OP MEDICINES °? A multi-modal approach will be used to treat your pain. °• Oxycodone - This is a strong narcotic, to be used only on an “as needed” basis for pain. °• Acetaminophen - A non-narcotic pain medicine.  Use 1000mg three times a day for the first 14 days after surgery. °• Lovenox 40mg -  This is a medicine used to help prevent blood clots.  Please use it daily until your primary doctor can transition you to a more long term oral medicine. °? If you have any adverse effects with the medications, please call our office. ° °FOLLOW-UP °? If you develop a Fever (>101.5), Redness or Drainage from the surgical incision site, please call our office to arrange for an evaluation. °? Please call the office to schedule a follow-up appointment for your suture removal, 10-14 days post-operatively. °IF YOU HAVE ANY QUESTIONS, PLEASE FEEL FREE  TO CALL OUR OFFICE. ° ° °

## 2017-10-21 NOTE — Progress Notes (Signed)
Pt's daughter brought in copy of pt's Advanced Directives. Copy made and placed in chart.

## 2017-10-21 NOTE — Progress Notes (Signed)
Rehab Admissions Coordinator Note:  Patient was screened by Cleatrice Burke for appropriateness for an Inpatient Acute Rehab Consult per PT recommendation.   At this time, we are recommending [facility]. Blue medicare unlikely to approve an inpt rehab admit for pt's diagnosis.  Cleatrice Burke 10/21/2017, 1:34 PM  I can be reached at [phone].

## 2017-10-21 NOTE — Progress Notes (Signed)
PROGRESS NOTE  Amber [name] QMG:[id number] DOB: 1932/11/05 DOA: 10/19/2017 PCP: Heywood Bene, PA-C  HPI/Recap of past 24 hours:  POD#1 She has some pain with physical therapy Daughter at bedside  Assessment/Plan: Principal Problem:   Proximal Femur Fracture, left /Mechanical Fall Active Problems:   Hypertension   Hypothyroidism   Femur fracture (HCC)  Left femoral neck fracture - status post mechanical fall from chronic dizziness ("States she is dizzy all the time and this morning was no different than her baseline." per EDP on presentation) -s/pL hip hemiarthroplasty on 9/7 by ortho Dr Griffin Basil -weightbearing as tolerated with posterior hip precautions.  DVT prophylaxis Lovenox 40 mg daily.  -plan per Dr Griffin Basil  Low b12, continue b12 supplement  Chronic mild thrombocytopenia Stable at baseline  CKDII Stable at baseline  HTN/HLD, home meds benazepril/ statin/fish oil  Hypothyroidism on synthroid  Chronic dizziness Patient reports daily ongoing for a year, she reports has extensive work up by neurology /ENT. 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  Code Status: DNR, confirmed with patient and daughter at bedside  Family Communication: patient and daughter at bedside  Disposition Plan: pending PT eval, likely need snf  She prefers [facility]  Consultants:  ortho  Procedures: s/pL hip hemiarthroplasty on 9/7 by ortho Dr Griffin Basil  Antibiotics:  perioperative   Objective: BP (!) 154/61 (BP Location: Right Arm)   Pulse 66   Temp 97.8 F (36.6 C) (Oral)   Resp 16   Ht 5\' 8"  (1.727 m)   Wt 67.1 kg   SpO2 100%   BMI 22.50 kg/m   Intake/Output Summary (Last 24 hours) at 10/21/2017 0834 Last data filed at 10/21/2017  0500 Gross per 24 hour  Intake 1130.39 ml  Output 1475 ml  Net -344.61 ml   Filed Weights   10/19/17 1110  Weight: 67.1 kg    Exam: Patient is examined daily including today on 10/21/2017, exams remain the same as of yesterday except that has changed    General:  NAD, AAOx3  Cardiovascular: RRR  Respiratory: CTABL  Abdomen: Soft/ND/NT, positive BS  Musculoskeletal: left leg post op changes  Neuro: oriented   Data Reviewed: Basic Metabolic Panel: Recent Labs  Lab 10/19/17 1126 10/20/17 1124 10/21/17 0447  NA 143  --  141  K 3.8  --  4.4  CL 109  --  107  CO2 27  --  26  GLUCOSE 101*  --  111*  BUN 15  --  12  CREATININE 1.01* 1.00 1.00  CALCIUM 9.0  --  8.6*   Liver Function Tests: Recent Labs  Lab 10/19/17 1126  AST 19  ALT 13  ALKPHOS 58  BILITOT 0.7  PROT 7.0  ALBUMIN 3.5   No results for input(s): LIPASE, AMYLASE in the last 168 hours. No results for input(s): AMMONIA in the last 168 hours. CBC: Recent Labs  Lab 10/19/17 1126 10/20/17 1124 10/21/17 0447  WBC 4.9 8.2 7.3  NEUTROABS 4.0  --   --   HGB 12.2 12.6 11.7*  HCT 36.2 38.7 35.6*  MCV 88.5 91.1 90.8  PLT 170 143* 160   Cardiac Enzymes:   No results for input(s): CKTOTAL, CKMB, CKMBINDEX, TROPONINI in the last 168 hours. BNP (last 3  results) No results for input(s): BNP in the last 8760 hours.  ProBNP (last 3 results) No results for input(s): PROBNP in the last 8760 hours.  CBG: Recent Labs  Lab 10/19/17 1130  GLUCAP 96    Recent Results (from the past 240 hour(s))  Surgical pcr screen     Status: None   Collection Time: 10/20/17  2:48 AM  Result Value Ref Range Status   MRSA, PCR NEGATIVE NEGATIVE Final   Staphylococcus aureus NEGATIVE NEGATIVE Final    Comment: (NOTE) The Xpert SA Assay (FDA approved for NASAL specimens in patients 60 years of age and older), is one component of a comprehensive surveillance program. It is not intended to diagnose infection nor  to guide or monitor treatment. Performed at [facility], [place] [address], [place],  [address]      Studies: Pelvis Portable  Result Date: 10/20/2017 CLINICAL DATA:  Left hip replacement EXAM: PORTABLE PELVIS 1-2 VIEWS COMPARISON:  None. FINDINGS: Patient is status post left hip replacement. Soft tissue gas is consistent with the recent surgery. Hardware is in good position. A more remote right hip replacement is identified as well. IMPRESSION: Left hip replacement as above. Electronically Signed   By: Dorise Bullion III M.D   On: 10/20/2017 11:35    Scheduled Meds: . aspirin EC  81 mg Oral Daily  . benazepril  20 mg Oral Daily  . docusate sodium  100 mg Oral BID  . enoxaparin (LOVENOX) injection  40 mg Subcutaneous Q24H  . famotidine  20 mg Oral Daily  . levothyroxine  75 mcg Oral QAC breakfast  . multivitamin with minerals  1 tablet Oral Daily  . omega-3 acid ethyl esters  1 g Oral Daily  . pravastatin  20 mg Oral q1800  . sodium chloride flush  3 mL Intravenous Q12H  . vitamin B-12  1,000 mcg Oral Daily    Continuous Infusions: . sodium chloride 200 mL (10/20/17 1827)     Time spent: 25 mins I have personally reviewed and interpreted on  10/21/2017 daily labs, imagings as discussed above under date review session and assessment and plans.  I reviewed all nursing notes, pharmacy notes, consultant notes,  vitals, pertinent old records  I have discussed plan of care as described above with RN , patient and daughter on 10/21/2017   Florencia Reasons MD, PhD  [facility] Pager [phone]. If 7PM-7AM, please contact night-coverage at [email or web address], password [email or web address] 10/21/2017, 8:34 AM  LOS: 2 days

## 2017-10-21 NOTE — Plan of Care (Signed)

## 2017-10-22 ENCOUNTER — Encounter (HOSPITAL_COMMUNITY): Payer: Self-pay | Admitting: Orthopaedic Surgery

## 2017-10-22 LAB — MAGNESIUM: MAGNESIUM: 2.1 mg/dL (ref 1.7–2.4)

## 2017-10-22 LAB — CBC
HCT: 34.4 % — ABNORMAL LOW (ref 36.0–46.0)
HEMOGLOBIN: 11.1 g/dL — AB (ref 12.0–15.0)
MCH: 29 pg (ref 26.0–34.0)
MCHC: 32.3 g/dL (ref 30.0–36.0)
MCV: 89.8 fL (ref 78.0–100.0)
PLATELETS: 159 10*3/uL (ref 150–400)
RBC: 3.83 MIL/uL — AB (ref 3.87–5.11)
RDW: 12.6 % (ref 11.5–15.5)
WBC: 7.1 10*3/uL (ref 4.0–10.5)

## 2017-10-22 LAB — BASIC METABOLIC PANEL
ANION GAP: 8 (ref 5–15)
BUN: 11 mg/dL (ref 8–23)
CHLORIDE: 105 mmol/L (ref 98–111)
CO2: 25 mmol/L (ref 22–32)
Calcium: 8.3 mg/dL — ABNORMAL LOW (ref 8.9–10.3)
Creatinine, Ser: 0.88 mg/dL (ref 0.44–1.00)
GFR calc non Af Amer: 59 mL/min — ABNORMAL LOW (ref >60)
Glucose, Bld: 112 mg/dL — ABNORMAL HIGH (ref 70–99)
POTASSIUM: 3.6 mmol/L (ref 3.5–5.1)
SODIUM: 138 mmol/L (ref 135–145)

## 2017-10-22 MED ORDER — POLYETHYLENE GLYCOL 3350 17 G PO PACK
17.0000 g | PACK | Freq: Every day | ORAL | Status: DC
  Administered 2017-10-22 – 2017-10-23 (×2): 17 g via ORAL
  Filled 2017-10-22: qty 1

## 2017-10-22 MED ORDER — SENNOSIDES-DOCUSATE SODIUM 8.6-50 MG PO TABS
1.0000 | ORAL_TABLET | Freq: Two times a day (BID) | ORAL | Status: DC
  Administered 2017-10-22 – 2017-10-23 (×3): 1 via ORAL
  Filled 2017-10-22 (×3): qty 1

## 2017-10-22 MED ORDER — POTASSIUM CHLORIDE CRYS ER 20 MEQ PO TBCR
40.0000 meq | EXTENDED_RELEASE_TABLET | Freq: Once | ORAL | Status: AC
  Administered 2017-10-22: 40 meq via ORAL
  Filled 2017-10-22: qty 2

## 2017-10-22 MED ORDER — SODIUM CHLORIDE 0.9 % IV BOLUS
500.0000 mL | Freq: Once | INTRAVENOUS | Status: AC
  Administered 2017-10-22 (×2): 250 mL via INTRAVENOUS

## 2017-10-22 NOTE — Clinical Social Work Note (Signed)
CSW started insurance authorization. Pt has selected [facility].  [place], [place]

## 2017-10-22 NOTE — Plan of Care (Signed)
  Problem: Education: Goal: Knowledge of General Education information will improve Description: Including pain rating scale, medication(s)/side effects and non-pharmacologic comfort measures Outcome: Progressing   Problem: Activity: Goal: Risk for activity intolerance will decrease Outcome: Progressing   Problem: Nutrition: Goal: Adequate nutrition will be maintained Outcome: Progressing   

## 2017-10-22 NOTE — Care Management Important Message (Signed)
Important Message  Patient Details  Name: Amber [name] MRN: [id number] Date of Birth: 04/26/1932   Medicare Important Message Given:  Yes    [name] [name] 10/22/2017, 3:43 PM

## 2017-10-22 NOTE — Clinical Social Work Note (Signed)
Clinical Social Work Assessment  Patient Details  Name: Amber [name] MRN: [id number] Date of Birth: 19-Apr-1932  Date of referral:  10/22/17               Reason for consult:  Facility Placement                Permission sought to share information with:  Family Supports, Chartered certified accountant granted to share information::  Yes, Verbal Permission Granted  Name::     Clinical cytogeneticist::  [facility]  Relationship::  Spouse  Contact Information:     Housing/Transportation Living arrangements for the past 2 months:  [place] of Information:  Patient Patient Interpreter Needed:    Criminal Activity/Legal Involvement Pertinent to Current Situation/Hospitalization:  No - Comment as needed Significant Relationships:  Spouse Lives with:  Spouse Do you feel safe going back to the place where you live?  No Need for family participation in patient care:  No (Coment)  Care giving concerns:  Pt is alert and oriented.   Social Worker assessment / plan:  CSW spoke with pt at bedside. CSW explained that insurance will not approve pt to go to CIR. Pt states she has been to [facility] in the past and is agreeable to go there. CSW will reach out to facility and start insurance auth.  Employment status:  Retired Nurse, adult PT Recommendations:  [place] / Referral to community resources:  [place]  Patient/Family's Response to care:  Pt verbalized understanding of CSW role and expressed appreciation for support. Pt denies any concern regarding pt care at this time.   Patient/Family's Understanding of and Emotional Response to Diagnosis, Current Treatment, and Prognosis:  Pt understanding and realistic regarding physical limitations. Pt understands the need for SNF placement at d/c. Pt agreeable to SNF placement at d/c, at this time. Pt's responses emotionally appropriate during  conversation with CSW. Pt denies any concern regarding treatment plan at this time. CSW will continue to provide support and facilitate d/c needs.   Emotional Assessment Appearance:  Appears stated age Attitude/Demeanor/Rapport:  (Patient was appropriate) Affect (typically observed):  Accepting, Appropriate, Calm Orientation:  Oriented to Self, Oriented to Place, Oriented to  Time, Oriented to Situation Alcohol / Substance use:  Not Applicable Psych involvement (Current and /or in the community):  No (Comment)  Discharge Needs  Concerns to be addressed:  Basic Needs Readmission within the last 30 days:  No Current discharge risk:  Dependent with Mobility Barriers to Discharge:  Ship broker, Continued Medical Work up   [employer], LCSW 10/22/2017, 11:48 AM

## 2017-10-22 NOTE — Clinical Social Work Note (Addendum)
CSW received voice mail (4:17 pm)  from [employer] giving authorization information:  Approval number - [id number], eff. 3 days Updates due 9/11 RUG Level - RVB 561 therapy minutes Josephina Gip is insurance contact person.  Call made to [employer], admissions director with [facility] regarding authorization. Per Narda Rutherford, family has to do paperwork before patient can come. She requested that family come to [facility] Tuesday at 1 pm. Call made to patient's husband, Deidre Ala and he requested that [facility] contact patient's daughter regarding the paperwork. Mr. Blackwell reported that he is 82 years old and hard of hearing. Call made to Eli Hose - [phone] (5:42 pm) and message left. CSW will follow-up with daughter on 9/10 if no call back received this evening.  6:22 pm - Received call from Ms. Bilar returning CSW's call. Requested that she contact admissions director at [facility] on Tuesday regarding completing admissions paperwork and phone number provided. Advised Ms. Bilar that once paperwork completed, patient can d/c to facility.  Naiara Lombardozzi [place], MSW, LCSW Licensed Clinical Social Worker [place] [phone]

## 2017-10-22 NOTE — NC FL2 (Signed)
[place] LEVEL OF CARE SCREENING TOOL     IDENTIFICATION  Patient Name: Amber [name] Birthdate: 14-Aug-1932 Sex: female Admission Date (Current Location): 10/19/2017  [facility] and [place] Number:  Herbalist and Address:  [facility], [place] [address], [place], [place] [address]      Provider Number: [id number]  Attending Physician Name and Address:  Florencia Reasons, MD  Relative Name and Phone Number:       Current Level of Care: Hospital Recommended Level of Care: [place] Prior Approval Number:    Date Approved/Denied:   PASRR Number: [id number]  Discharge Plan: SNF    Current Diagnoses: Patient Active Problem List   Diagnosis Date Noted  . Proximal Femur Fracture, left [place]el Books Fall 10/19/2017  . Femur fracture ([place]) 10/19/2017  . Dizziness 01/19/2016  . Fall 01/19/2016  . Closed right hip fracture ([place]) 01/19/2016  . Hypertension 01/19/2016  . Hypothyroidism 01/19/2016  . Hyperlipidemia 01/19/2016  . Hip fracture ([place]) 01/19/2016  . Status post laparoscopic cholecystectomy 03/27/2013    Orientation RESPIRATION BLADDER Height & Weight     Self, Situation, Time, Place  Normal Incontinent, External catheter(placed 10/20/17) Weight: 148 lb (67.1 kg) Height:  5\' 8"  (172.7 cm)  BEHAVIORAL SYMPTOMS/MOOD NEUROLOGICAL BOWEL NUTRITION STATUS      Continent Diet  AMBULATORY STATUS COMMUNICATION OF NEEDS Skin   Limited Assist Verbally Surgical wounds(Closed incision left hip, silver hydrofiber dressing. Closed incision right hip.)                       Personal Care Assistance Level of Assistance  Feeding, Dressing, Bathing Bathing Assistance: Limited assistance Feeding assistance: Independent Dressing Assistance: Limited assistance     Functional Limitations Info  Sight, Hearing, Speech Sight Info: Adequate Hearing Info: Adequate Speech Info: Adequate    SPECIAL CARE FACTORS FREQUENCY  OT (By  licensed OT), PT (By licensed PT)     PT Frequency: 3x OT Frequency: 3x            Contractures Contractures Info: Not present    Additional Factors Info  Code Status, Allergies Code Status Info: DNR Allergies Info: NO known allergies           Current Medications (10/22/2017):  This is the current hospital active medication list Current Facility-Administered Medications  Medication Dose Route Frequency Provider Last Rate Last Dose  . 0.9 %  sodium chloride infusion  250 mL Intravenous PRN Roxan Hockey, MD 20 mL/hr at 10/20/17 1827 200 mL at 10/20/17 1827  . albuterol (PROVENTIL) (2.5 MG/3ML) 0.083% nebulizer solution 2.5 mg  2.5 mg Nebulization Q2H PRN Emokpae, Courage, MD      . aspirin EC tablet 81 mg  81 mg Oral Daily Emokpae, Courage, MD   81 mg at 10/22/17 0906  . benazepril (LOTENSIN) tablet 20 mg  20 mg Oral Daily Emokpae, Courage, MD   20 mg at 10/22/17 0906  . enoxaparin (LOVENOX) injection 40 mg  40 mg Subcutaneous Q24H Ophelia Charter T, MD   40 mg at 10/22/17 0914  . famotidine (PEPCID) tablet 20 mg  20 mg Oral Daily Emokpae, Courage, MD   20 mg at 10/22/17 0906  . hydrALAZINE (APRESOLINE) injection 10 mg  10 mg Intravenous Q6H PRN Emokpae, Courage, MD      . levothyroxine (SYNTHROID, LEVOTHROID) tablet 75 mcg  75 mcg Oral QAC breakfast Roxan Hockey, MD   75 mcg at 10/22/17 0627  .  menthol-cetylpyridinium (CEPACOL) lozenge 3 mg  1 lozenge Oral PRN Hiram Gash, MD       Or  . phenol (CHLORASEPTIC) mouth spray 1 spray  1 spray Mouth/Throat PRN Ophelia Charter T, MD      . morphine 2 MG/ML injection 2 mg  2 mg Intravenous Q4H PRN Roxan Hockey, MD   2 mg at 10/21/17 2345  . multivitamin with minerals tablet 1 tablet  1 tablet Oral Daily Roxan Hockey, MD   1 tablet at 10/22/17 0906  . omega-3 acid ethyl esters (LOVAZA) capsule 1 g  1 g Oral Daily Emokpae, Courage, MD   1 g at 10/22/17 0906  . ondansetron (ZOFRAN) tablet 4 mg  4 mg Oral Q6H PRN Emokpae, Courage,  MD       Or  . ondansetron (ZOFRAN) injection 4 mg  4 mg Intravenous Q6H PRN Emokpae, Courage, MD      . oxyCODONE (Oxy IR/ROXICODONE) immediate release tablet 5 mg  5 mg Oral Q4H PRN Denton Brick, Courage, MD   5 mg at 10/22/17 0906  . polyethylene glycol (MIRALAX / GLYCOLAX) packet 17 g  17 g Oral Daily PRN Emokpae, Courage, MD      . polyethylene glycol (MIRALAX / GLYCOLAX) packet 17 g  17 g Oral Daily Florencia Reasons, MD      . potassium chloride SA (K-DUR,KLOR-CON) CR tablet 40 mEq  40 mEq Oral Once Florencia Reasons, MD      . pravastatin (PRAVACHOL) tablet 20 mg  20 mg Oral q1800 Emokpae, Courage, MD   20 mg at 10/21/17 1656  . senna-docusate (Senokot-S) tablet 1 tablet  1 tablet Oral BID Florencia Reasons, MD      . sodium chloride flush (NS) 0.9 % injection 3 mL  3 mL Intravenous Q12H Denton Brick, Courage, MD   3 mL at 10/22/17 0907  . sodium chloride flush (NS) 0.9 % injection 3 mL  3 mL Intravenous PRN Emokpae, Courage, MD      . traZODone (DESYREL) tablet 50 mg  50 mg Oral QHS PRN Emokpae, Courage, MD      . vitamin B-12 (CYANOCOBALAMIN) tablet 1,000 mcg  1,000 mcg Oral Daily Denton Brick, Courage, MD   1,000 mcg at 10/22/17 0906     Discharge Medications: Please see discharge summary for a list of discharge medications.  Relevant Imaging Results:  Relevant Lab Results:   Additional Information SSN: [id number]  Eileen Stanford, LCSW

## 2017-10-22 NOTE — Progress Notes (Signed)
PROGRESS NOTE  Amber [name] TDV:[id number] DOB: September 22, 1932 DOA: 10/19/2017 PCP: Heywood Bene, PA-C  HPI/Recap of past 24 hours:  POD#2 She has some pain with physical therapy C/o being constipated  Assessment/Plan: Principal Problem:   Proximal Femur Fracture, left /Mechanical Fall Active Problems:   Hypertension   Hypothyroidism   Femur fracture (HCC)  Left femoral neck fracture - status post mechanical fall from chronic dizziness ("States she is dizzy all the time and this morning was no different than her baseline." per EDP on presentation) -s/pL hip hemiarthroplasty on 9/7 by ortho Dr Griffin Basil -weightbearing as tolerated with posterior hip precautions.  DVT prophylaxis Lovenox 40 mg daily.  -plan per Dr Griffin Basil  Low b12, continue b12 supplement  Chronic mild thrombocytopenia Stable at baseline  CKDII Stable at baseline  HTN/HLD, home meds benazepril/ statin/fish oil  Hypothyroidism on synthroid  Chronic dizziness Patient reports daily ongoing for a year, she reports has extensive work up by neurology /ENT. 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  Code Status: DNR, confirmed with patient and daughter at bedside on 9/8  Family Communication: patient and daughter at bedside on 9/8  Disposition Plan:  Snf, awaiting for bed, hopefully on 9/10  She prefers [facility]  Consultants:  ortho  Procedures: s/pL hip hemiarthroplasty on 9/7 by ortho Dr Griffin Basil  Antibiotics:  perioperative   Objective: BP 123/71 (BP Location: Right Arm)   Pulse 84   Temp 98.7 F (37.1  C) (Oral)   Resp 18   Ht 5\' 8"  (1.727 m)   Wt 67.1 kg   SpO2 98%   BMI 22.50 kg/m   Intake/Output Summary (Last 24 hours) at 10/22/2017 1110 Last data filed at 10/22/2017 0900 Gross per 24 hour  Intake 720 ml  Output 1500 ml  Net -780 ml   Filed Weights   10/19/17 1110  Weight: 67.1 kg    Exam: Patient is examined daily including today on 10/22/2017, exams remain the same as of yesterday except that has changed    General:  NAD, AAOx3  Cardiovascular: RRR  Respiratory: CTABL  Abdomen: Soft/ND/NT, positive BS  Musculoskeletal: left leg post op changes  Neuro: oriented   Data Reviewed: Basic Metabolic Panel: Recent Labs  Lab 10/19/17 1126 10/20/17 1124 10/21/17 0447 10/22/17 0356  NA 143  --  141 138  K 3.8  --  4.4 3.6  CL 109  --  107 105  CO2 27  --  26 25  GLUCOSE 101*  --  111* 112*  BUN 15  --  12 11  CREATININE 1.01* 1.00 1.00 0.88  CALCIUM 9.0  --  8.6* 8.3*  MG  --   --   --  2.1   Liver Function Tests: Recent Labs  Lab 10/19/17 1126  AST 19  ALT 13  ALKPHOS 58  BILITOT 0.7  PROT 7.0  ALBUMIN 3.5   No results for input(s): LIPASE, AMYLASE in the last 168 hours. No results for input(s): AMMONIA in the last 168 hours. CBC: Recent Labs  Lab 10/19/17 1126 10/20/17 1124 10/21/17 0447 10/22/17 0356  WBC 4.9 8.2 7.3 7.1  NEUTROABS 4.0  --   --   --   HGB 12.2 12.6 11.7* 11.1*  HCT 36.2 38.7 35.6* 34.4*  MCV 88.5 91.1 90.8 89.8  PLT 170 143* 160 159   Cardiac Enzymes:   No results for input(s): CKTOTAL, CKMB, CKMBINDEX, TROPONINI in the last 168 hours. BNP (last 3 results) No results for input(s): BNP in the last 8760 hours.  ProBNP (last 3 results) No results for input(s): PROBNP in the last 8760 hours.  CBG: Recent Labs  Lab 10/19/17 1130  GLUCAP 96    Recent Results (from the past 240 hour(s))  Surgical pcr screen     Status: None   Collection Time: 10/20/17  2:48 AM  Result Value Ref Range Status   MRSA, PCR NEGATIVE  NEGATIVE Final   Staphylococcus aureus NEGATIVE NEGATIVE Final    Comment: (NOTE) The Xpert SA Assay (FDA approved for NASAL specimens in patients 58 years of age and older), is one component of a comprehensive surveillance program. It is not intended to diagnose infection nor to guide or monitor treatment. Performed at [facility], [place] [address], [place], [place] [address]      Studies: No results found.  Scheduled Meds: . aspirin EC  81 mg Oral Daily  . benazepril  20 mg Oral Daily  . enoxaparin (LOVENOX) injection  40 mg Subcutaneous Q24H  . famotidine  20 mg Oral Daily  . levothyroxine  75 mcg Oral QAC breakfast  . multivitamin with minerals  1 tablet Oral Daily  . omega-3 acid ethyl esters  1 g Oral Daily  . polyethylene glycol  17 g Oral Daily  . pravastatin  20 mg Oral q1800  . senna-docusate  1 tablet Oral BID  . sodium chloride flush  3 mL Intravenous Q12H  . vitamin B-12  1,000 mcg Oral Daily    Continuous Infusions: . sodium chloride 200 mL (10/20/17 1827)  . [COMPLETED] sodium chloride 250 mL (10/22/17 0922)     Time spent: 25 mins, case discussed with RN and social worker I have personally reviewed and interpreted on  10/22/2017 daily labs, imagings as discussed above under date review session and assessment and plans.  I reviewed all nursing notes, pharmacy notes, consultant notes,  vitals, pertinent old records  I have discussed plan of care as described above with RN , patient  on 10/22/2017   Florencia Reasons MD, PhD  [facility] Pager [phone]. If 7PM-7AM, please contact night-coverage at [email or web address], password [facility] 10/22/2017, 11:10 AM  LOS: 3 days

## 2017-10-22 NOTE — Progress Notes (Signed)
ORTHOPAEDIC PROGRESS NOTE  s/p Procedure(s): ARTHROPLASTY BIPOLAR HIP (HEMIARTHROPLASTY)  SUBJECTIVE: No complaints, worked with PT yesterday  OBJECTIVE: PE: LLE: incision CDI, abduction pillow in place, leg lengths equal, warm well perfused foot, intact EHL/TA/GSC  Vitals:   10/21/17 1950 10/22/17 0446  BP: 134/65 (!) 119/59  Pulse: 88 84  Resp: 17 18  Temp: 98.5 F (36.9 C) 99.7 F (37.6 C)  SpO2: 99% 98%     ASSESSMENT: Amber [name] is a 84 y.o. female doing well postoperatively.  PLAN: Weightbearing: WBAT LLE Insicional and dressing care: OK to remove dressings POD7 and leave open to air with dry gauze PRN Orthopedic device(s): None Showering: prn VTE prophylaxis: Lovenox 40mg  qd x6 weeks Pain control: prn meds Follow - up plan: 2 weeks Contact information:  Weekdays 8-5 Ophelia Charter MD [phone], After hours and holidays please check [email or web address] for group call information for [facility]

## 2017-10-22 NOTE — Plan of Care (Signed)
  Problem: Education: Goal: Knowledge of General Education information will improve Description Including pain rating scale, medication(s)/side effects and non-pharmacologic comfort measures 10/22/2017 1250 by Meredith Mody Outcome: Progressing 10/22/2017 1134 by Meredith Mody Outcome: Progressing   Problem: Health Behavior/Discharge Planning: Goal: Ability to manage health-related needs will improve Outcome: Progressing   Problem: Activity: Goal: Risk for activity intolerance will decrease Outcome: Progressing   Problem: Nutrition: Goal: Adequate nutrition will be maintained Outcome: Progressing

## 2017-10-22 NOTE — Progress Notes (Signed)
Nutrition Brief Note  Patient identified on the Malnutrition Screening Tool (MST) Report.  Patient admitted for Closed fracture of left hip, initial encounter ([place]) [S72.002A].  Wt Readings from Last 15 Encounters:  10/19/17 67.1 kg  10/30/16 66.7 kg  01/19/16 68 kg  05/02/13 69.4 kg  03/26/13 69.9 kg   Body mass index is 22.5 kg/m. Patient meets criteria for Normal based on current BMI.   Current diet order is Regular, patient is consuming approximately 100% of meals at this time. Labs and medications reviewed.   No nutrition interventions warranted at this time. If nutrition issues arise, please consult RD.   Arthur Holms, RD, LDN Pager #: [phone] After-Hours Pager #: [phone]

## 2017-10-22 NOTE — Evaluation (Signed)
Occupational Therapy Evaluation Patient Details Name: Amber [name] MRN: [id number] DOB: [birth year] Today's Date: 10/22/2017    History of Present Illness 84 y.o. female with past medical history relevant for hypertension, history of falls, dyslipidemia and hypothyroidism who presents following a fall resulting in Lt hip fracture. S/p Lt hip ARTHROPLASTY BIPOLAR HIP (HEMIARTHROPLASTY.)   Clinical Impression   PTA, pt was independent with occasional use of cane for ADL and functional mobility. She reports a history of falling at home due to dizziness. Pt currently requiring mod assist for toilet transfers, max assist for toileting hygiene, and total assist for LB ADL. Educated pt concerning posterior hip precautions and provided cues to adhere to these throughout tasks. Pt would benefit from continued OT services while admitted to improve independence with ADL and functional mobility prior to returning home. Due to pt's PLOF and motivation to return to independence, feel that she would best benefit from CIR level therapies post-acute D/C although note unable to achieve approval for CIR. Thus, recommend SNF placement for rehabilitation.     Follow Up Recommendations  CIR;Supervision/Assistance - 24 hour(Will need SNF due to CIR denial)    Equipment Recommendations  3 in 1 bedside commode    Recommendations for Other Services       Precautions / Restrictions Precautions Precautions: Posterior Hip;Fall Precaution Booklet Issued: Yes (comment) Precaution Comments: Reviewed posterior hip precautions with pt; she required cues and repeated education.  Required Braces or Orthoses: (hip abduction pillow) Restrictions Weight Bearing Restrictions: Yes LLE Weight Bearing: Weight bearing as tolerated      Mobility Bed Mobility Overal bed mobility: Needs Assistance Bed Mobility: Supine to Sit     Supine to sit: Mod assist;HOB elevated     General bed mobility comments: Mod assist to manage  LLE as well as raise trunk from [facility].   Transfers Overall transfer level: Needs assistance Equipment used: Rolling [place] (2 wheeled) Transfers: Sit to/from Stand Sit to Stand: Mod assist         General transfer comment: Mod assist to power up to standing with cues for posterior hip precautions.     Balance Overall balance assessment: Needs assistance;History of Falls Sitting-balance support: No upper extremity supported Sitting balance-Leahy Scale: Fair     Standing balance support: Single extremity supported Standing balance-Leahy Scale: Poor                             ADL either performed or assessed with clinical judgement   ADL Overall ADL's : Needs assistance/impaired Eating/Feeding: Set up;Sitting   Grooming: Supervision/safety;Sitting   Upper Body Bathing: Minimal assistance;Sitting   Lower Body Bathing: Moderate assistance;Sit to/from stand   Upper Body Dressing : Minimal assistance;Sitting   Lower Body Dressing: Moderate assistance;Sit to/from stand   Toilet Transfer: Moderate assistance;Stand-pivot;RW(taking a few pivotal steps)   Toileting- Clothing Manipulation and Hygiene: Maximal assistance;Sit to/from stand       Functional mobility during ADLs: Moderate assistance;Rolling [place](stand-pivot only) General ADL Comments: Pt limited by pain this session but motivated to participate. Educated concerning impact of posterior hip precautions on ADL participation.      Vision Patient Visual Report: No change from baseline Vision Assessment?: No apparent visual deficits     Perception     Praxis      Pertinent Vitals/Pain Pain Assessment: 0-10 Pain Score: 7  Pain Location: L hip; 7 with movement, 5 at rest in chair after transfer Pain Descriptors /  Indicators: Operative site guarding Pain Intervention(s): Limited activity within patient's tolerance;Monitored during session;Repositioned;Premedicated before session     Hand Dominance      Extremity/Trunk Assessment Upper Extremity Assessment Upper Extremity Assessment: Overall WFL for tasks assessed   Lower Extremity Assessment Lower Extremity Assessment: LLE deficits/detail LLE Deficits / Details: Decreased strength and ROM as expected post-operatively.        Communication Communication Communication: HOH   Cognition Arousal/Alertness: Awake/alert Behavior During Therapy: WFL for tasks assessed/performed Overall Cognitive Status: Within Functional Limits for tasks assessed                                     General Comments       Exercises     Shoulder Instructions      Home Living Family/patient expects to be discharged to:: Unsure Living Arrangements: Spouse/significant other Available Help at Discharge: Family;Other (Comment)(husband is recovering from surgery) Type of Home: House Home Access: Stairs to enter(threshold about 4")   Entrance Stairs-Rails: None Home Layout: Two level;Able to live on main level with bedroom/bathroom     Bathroom Shower/Tub: Tub/shower unit     Bathroom Accessibility: Yes   Home Equipment: Shower seat;Grab bars - tub/shower;Hand held shower head;Cane - single point;Bedside commode          Prior Functioning/Environment Level of Independence: Independent with assistive device(s);Independent        Comments: Occasional use of SPC. History of dizziness for 1 year.         OT Problem List: Decreased strength;Decreased range of motion;Decreased activity tolerance;Impaired balance (sitting and/or standing);Decreased safety awareness;Decreased knowledge of use of DME or AE;Decreased knowledge of precautions;Pain      OT Treatment/Interventions: Self-care/ADL training;Therapeutic exercise;Energy conservation;DME and/or AE instruction;Therapeutic activities;Patient/family education;Balance training    OT Goals(Current goals can be found in the care plan section) Acute Rehab OT Goals Patient  Stated Goal: Get well so I can go home OT Goal Formulation: With patient Time For Goal Achievement: 11/05/17 Potential to Achieve Goals: Good ADL Goals Pt Will Perform Grooming: with min guard assist;standing Pt Will Perform Lower Body Bathing: with min assist;with adaptive equipment;sit to/from stand Pt Will Perform Lower Body Dressing: with min assist;with adaptive equipment;sit to/from stand Pt Will Transfer to Toilet: with min guard assist;ambulating;bedside commode(BSC over toilet) Pt Will Perform Toileting - Clothing Manipulation and hygiene: with min assist;sit to/from stand  OT Frequency: Min 2X/week   Barriers to D/C:            Co-evaluation              AM-PAC PT "6 Clicks" Daily Activity     Outcome Measure Help from another person eating meals?: None Help from another person taking care of personal grooming?: None Help from another person toileting, which includes using toliet, bedpan, or urinal?: A Lot Help from another person bathing (including washing, rinsing, drying)?: A Lot Help from another person to put on and taking off regular upper body clothing?: A Little Help from another person to put on and taking off regular lower body clothing?: A Lot 6 Click Score: 17   End of Session Equipment Utilized During Treatment: Gait belt;Rolling [place] Nurse Communication: Mobility status;Patient requests pain meds(Pt's IV is not connected )  Activity Tolerance: Patient tolerated treatment well Patient left: in chair;with call bell/phone within reach  OT Visit Diagnosis: Other abnormalities of gait and mobility (R26.89);Pain Pain - Right/Left:  Left Pain - part of body: Hip                Time: 4720-7218 OT Time Calculation (min): 45 min Charges:  OT General Charges $OT Visit: 1 Visit OT Evaluation $OT Eval Moderate Complexity: 1 Mod OT Treatments $Self Care/Home Management : 23-37 mins  [place] Pager  [phone] Office [place] A Lydiah Pong 10/22/2017, 10:29 AM

## 2017-10-23 DIAGNOSIS — E039 Hypothyroidism, unspecified: Secondary | ICD-10-CM

## 2017-10-23 DIAGNOSIS — I951 Orthostatic hypotension: Secondary | ICD-10-CM

## 2017-10-23 LAB — CBC
HCT: 30.5 % — ABNORMAL LOW (ref 36.0–46.0)
HEMOGLOBIN: 9.9 g/dL — AB (ref 12.0–15.0)
MCH: 29.5 pg (ref 26.0–34.0)
MCHC: 32.5 g/dL (ref 30.0–36.0)
MCV: 90.8 fL (ref 78.0–100.0)
Platelets: 151 10*3/uL (ref 150–400)
RBC: 3.36 MIL/uL — AB (ref 3.87–5.11)
RDW: 12.6 % (ref 11.5–15.5)
WBC: 7.3 10*3/uL (ref 4.0–10.5)

## 2017-10-23 LAB — CORTISOL: Cortisol, Plasma: 15.3 ug/dL

## 2017-10-23 LAB — TSH: TSH: 0.217 u[IU]/mL — ABNORMAL LOW (ref 0.350–4.500)

## 2017-10-23 MED ORDER — POLYETHYLENE GLYCOL 3350 17 G PO PACK: 17.0000 g | PACK | Freq: Every day | ORAL | 0 refills | Status: DC

## 2017-10-23 MED ORDER — LEVOTHYROXINE SODIUM 50 MCG PO TABS: 50.0000 ug | ORAL_TABLET | Freq: Every day | ORAL | 0 refills | Status: DC

## 2017-10-23 MED ORDER — SENNOSIDES-DOCUSATE SODIUM 8.6-50 MG PO TABS: 1.0000 | ORAL_TABLET | Freq: Two times a day (BID) | ORAL | 0 refills | Status: AC

## 2017-10-23 NOTE — Progress Notes (Signed)
Pt will DC to: [facility] date:10/23/2017 Family notified: Ralph(spouse) Transport [id number]  RN, patient, and facility notified of DC. Discharge Summary sent to facility. RN given number for report. DC packet on chart [phone] Rm 35. Ambulance transport requested for patient.   CSW signing off. Thurmond Butts, [place] Social Worker [phone]

## 2017-10-23 NOTE — Progress Notes (Signed)
Physical Therapy Treatment Patient Details Name: Amber [name] MRN: [id number] DOB: [birth year] Today's Date: 10/23/2017    History of Present Illness 84 y.o. female with past medical history relevant for hypertension, history of falls, dyslipidemia and hypothyroidism who presents following a fall resulting in Lt hip fracture. S/p Lt hip ARTHROPLASTY BIPOLAR HIP (HEMIARTHROPLASTY.)    PT Comments    Pt performed gait training and functional mobility with emphasis on  Safety to maintain posterior hip precautions with LLE.  Pt slow and guarded due to pain and required increased cueing to maintain safety as patient began to fatigue.  Pt only able to recall 1/3 hip precautions and re-educated on precautions before movement.  Plan for d/c updated to SNF as patient is going to require a slower recovery path and she is already pursuing SNF placement.    Follow Up Recommendations  SNF;Supervision/Assistance - 24 hour     Equipment Recommendations  (TBD at next venue of care.  )    Recommendations for Other Services       Precautions / Restrictions Precautions Precautions: Posterior Hip;Fall Precaution Booklet Issued: Yes (comment) Precaution Comments: Reviewed posterior hip precautions with pt; she required cues and repeated education.  Required Braces or Orthoses: (hip abduction pillow) Restrictions Weight Bearing Restrictions: Yes LLE Weight Bearing: Weight bearing as tolerated    Mobility  Bed Mobility Overal bed mobility: Needs Assistance Bed Mobility: Supine to Sit     Supine to sit: Mod assist;HOB elevated     General bed mobility comments: Pt required assistance to advance to edge of bed and elevate trunk into seated position.  Once feet on the floor able to improve and maintain sitting balance.    Transfers Overall transfer level: Needs assistance Equipment used: Rolling Lukins (2 wheeled) Transfers: Sit to/from Stand Sit to Stand: Min assist         General  transfer comment: Cues for hand placement as patient attempting to pull on RW into standing.  Cues to push from seated suirface and advance LLE forward to avoid excessive hip flexion.    Ambulation/Gait Ambulation/Gait assistance: Min assist Gait Distance (Feet): 20 Feet Assistive device: Rolling Hommel (2 wheeled) Gait Pattern/deviations: Step-to pattern;Shuffle;Trunk flexed;Decreased stride length;Antalgic;Decreased stance time - left;Decreased weight shift to left Gait velocity: slow   General Gait Details: Cues for upper trunk control and forward gaze,  assistance to advance RW and max VCs for sequencing when turning or backing to seated surface as well as forward ambulation.     Stairs             Wheelchair Mobility    Modified Rankin (Stroke Patients Only)       Balance Overall balance assessment: Needs assistance;History of Falls   Sitting balance-Leahy Scale: Fair       Standing balance-Leahy Scale: Poor                              Cognition Arousal/Alertness: Awake/alert Behavior During Therapy: WFL for tasks assessed/performed Overall Cognitive Status: Within Functional Limits for tasks assessed                                        Exercises      General Comments        Pertinent Vitals/Pain Pain Assessment: 0-10 Pain Score: 7  Pain Location: L hip with  movement.   Pain Descriptors / Indicators: Operative site guarding Pain Intervention(s): Monitored during session;Repositioned    Home Living                      Prior Function            PT Goals (current goals can now be found in the care plan section) Acute Rehab PT Goals Patient Stated Goal: Get well so I can go home Potential to Achieve Goals: Good Progress towards PT goals: Progressing toward goals    Frequency    Min 3X/week      PT Plan Discharge plan needs to be updated    Co-evaluation              AM-PAC PT "6 Clicks"  Daily Activity  Outcome Measure  Difficulty turning over in bed (including adjusting bedclothes, sheets and blankets)?: Unable Difficulty moving from lying on back to sitting on the side of the bed? : Unable Difficulty sitting down on and standing up from a chair with arms (e.g., wheelchair, bedside commode, etc,.)?: Unable Help needed moving to and from a bed to chair (including a wheelchair)?: A Little Help needed walking in hospital room?: A Little Help needed climbing 3-5 steps with a railing? : A Lot 6 Click Score: 11    End of Session Equipment Utilized During Treatment: Gait belt Activity Tolerance: Patient tolerated treatment well Patient left: in chair;with call bell/phone within reach;with chair alarm set;with family/visitor present;with SCD's reapplied Nurse Communication: Mobility status PT Visit Diagnosis: Other abnormalities of gait and mobility (R26.89);Muscle weakness (generalized) (M62.81);History of falling (Z91.81);Difficulty in walking, not elsewhere classified (R26.2);Pain Pain - Right/Left: Left Pain - part of body: Hip     Time: 7915-0569 PT Time Calculation (min) (ACUTE ONLY): 19 min  Charges:  $Therapeutic Activity: 8-22 mins                     Amber [name], PTA Acute Rehabilitation Services Pager [phone] Office [phone]     Amber [name] 10/23/2017, 10:35 AM

## 2017-10-23 NOTE — Clinical Social Work Placement (Signed)
   CLINICAL SOCIAL WORK PLACEMENT  NOTE  Date:  10/23/2017  Patient Details  Name: Amber [name] MRN: [id number] Date of Birth: 11-Aug-1932  Clinical Social Work is seeking post-discharge placement for this patient at the [place] level of care (*CSW will initial, date and re-position this form in  chart as items are completed):  Yes   Patient/family provided with [facility] Work Department's list of facilities offering this level of care within the geographic area requested by the patient (or if unable, by the patient's family).  Yes   Patient/family informed of their freedom to choose among providers that offer the needed level of care, that participate in Medicare, Medicaid or managed care program needed by the patient, have an available bed and are willing to accept the patient.  Yes   Patient/family informed of [facility]'s ownership interest in [facility] and [facility], as well as of the fact that they are under no obligation to receive care at these facilities.  PASRR submitted to EDS on       PASRR number received on 10/22/17     Existing PASRR number confirmed on       FL2 transmitted to all facilities in geographic area requested by pt/family on       FL2 transmitted to all facilities within larger geographic area on       Patient informed that his/her managed care company has contracts with or will negotiate with certain facilities, including the following:        Yes   Patient/family informed of bed offers received.  Patient chooses bed at [facility]     Physician recommends and patient chooses bed at      Patient to be transferred to [facility] on 10/23/17.  Patient to be transferred to facility by [facility]     Patient family notified on 10/23/17 of transfer.  Name of family member notified:  Ralph(spouse)     PHYSICIAN       Additional Comment:     _______________________________________________ Vinie Sill, [place] 10/23/2017, 12:39 PM

## 2017-10-23 NOTE — Discharge Summary (Signed)
Discharge Summary  Amber [name] YKD:[id number] DOB: 1932-12-17  PCP: Heywood Bene, PA-C  Admit date: 10/19/2017 Discharge date: 10/23/2017  Time spent: 65mins, more than 50% time spent on coordination of care.  Recommendations for Outpatient Follow-up:  1. F/u with SNF MD for hospital discharge follow up, repeat cbc/bmp at follow up 2. F/u with ortho Dr Griffin Basil in two weeks  Discharge Diagnoses:  Active Hospital Problems   Diagnosis Date Noted  . Proximal Femur Fracture, left Randel Books Fall 10/19/2017  . Femur fracture ([place]) 10/19/2017  . Hypertension 01/19/2016  . Hypothyroidism 01/19/2016    Resolved Hospital Problems  No resolved problems to display.    Discharge Condition: stable  Diet recommendation: heart healthy  Filed Weights   10/19/17 1110  Weight: 67.1 kg    History of present illness: (per admitting MD Dr Denton Brick) Amber [name]  is a 84 y.o. female with past medical history relevant for hypertension, history of falls, dyslipidemia and hypothyroidism who presents to the ED with left hip area pain after sustaining a mechanical fall (felt dizzy).    No head injury, no loss of consciousness, no chest pains or palpitation no syncopal type symptoms no prodromal symptoms.  Denies history of seizures  Denies lower extremity numbness or paresthesia  In ED-denies chest pains palpitations dizziness headaches visual disturbance or focal extremity weakness complains of left hip pain especially with range of motion  In ED creatinine is found to be 1.0 from a baseline of 0.8,  ED provider discussed this case with on-call orthopedic surgeon Dr. Griffin Basil who is on-call for Dr. Wonda Olds Course:  Principal Problem:   Proximal Femur Fracture, left Randel Books Fall Active Problems:   Hypertension   Hypothyroidism   Femur fracture ([place])   Left femoral neck fracture - status post mechanical fall from chronic dizziness ("States she is dizzy all the  time and this morning was no different than her baseline." per EDP on presentation) -s/pL hip hemiarthroplasty on 9/7 by ortho Dr Griffin Basil  Per Dr Griffin Basil: "Weightbearing: WBAT LLE Insicional and dressing care: OK to remove dressings POD7 and leave open to air with dry gauze PRN Orthopedic device(s): None Showering: prn VTE prophylaxis: Lovenox 40mg  qd x6 weeks Pain control: prn meds Follow - up plan: 2 weeks Contact information:  Weekdays 8-5 Ophelia Charter MD [phone]"  Low b12, continue b12 supplement, pcp to monitor b12 level  Chronic mild thrombocytopenia Stable at baseline  CKDII Stable at baseline  HTN/HLD, home meds benazepril/ statin/fish oil   Chronic dizziness Patient reports daily ongoing for a year, she reports has extensive work up by neurology /ENT. She reports it happens mostly in am when she gets up Suspect dehydration contribute,   She denies dizziness in the hospital " because I have not got up", orthostatic vital sign obtained which is mildly positive ( heart rate increased from 91 to 104 upon standing, sbp dropped from 134 to 120)  She received hydration in the hospital am cortisol level unremarkable. advised adequate hydration  Hypothyroidism: tsh 0.2, decrease synthroid from 43mcg daily to 43mcg daily, repeat  tsh in 4weeks.  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  Code Status: DNR, confirmed with patient and daughter at bedside on 9/8  Family Communication: patient and daughter at bedside on 9/8  Disposition Plan:  Snf on 9/10   Consultants:  ortho  Procedures: s/pL hip hemiarthroplasty on 9/7 by ortho Dr Griffin Basil  Antibiotics:  perioperative   Discharge  Exam: BP (!) 127/56 (BP Location: Right Arm)   Pulse 80   Temp 98.2 F (36.8 C) (Oral)   Resp 15   Ht 5\' 8"  (1.727 m)   Wt 67.1 kg   SpO2 99%   BMI 22.50 kg/m    General:  NAD, AAOx3  Cardiovascular: RRR  Respiratory:  CTABL  Abdomen: Soft/ND/NT, positive BS  Musculoskeletal: left leg post op changes  Neuro: oriented   Discharge Instructions You were cared for by a hospitalist during your hospital stay. If you have any questions about your discharge medications or the care you received while you were in the hospital after you are discharged, you can call the unit and asked to speak with the hospitalist on call if the hospitalist that took care of you is not available. Once you are discharged, your primary care physician will handle any further medical issues. Please note that NO REFILLS for any discharge medications will be authorized once you are discharged, as it is imperative that you return to your primary care physician (or establish a relationship with a primary care physician if you do not have one) for your aftercare needs so that they can reassess your need for medications and monitor your lab values.  Discharge Instructions    Diet - low sodium heart healthy   Complete by:  As directed    Increase activity slowly   Complete by:  As directed      Allergies as of 10/23/2017   No Known Allergies     Medication List    STOP taking these medications   HYDROcodone-acetaminophen 5-325 MG tablet Commonly known as:  NORCO/VICODIN   naproxen sodium 220 MG tablet Commonly known as:  ALEVE     TAKE these medications   acetaminophen 500 MG tablet Commonly known as:  TYLENOL Take 2 tablets (1,000 mg total) by mouth every 8 (eight) hours for 14 days.   aspirin EC 81 MG tablet Take 81 mg by mouth daily.   benazepril 20 MG tablet Commonly known as:  LOTENSIN Take 20 mg by mouth daily.   enoxaparin 40 MG/0.4ML injection Commonly known as:  LOVENOX Inject 0.4 mLs (40 mg total) into the skin daily.   FISH OIL OMEGA-3 PO Take 1 capsule by mouth every evening.   levothyroxine 50 MCG tablet Commonly known as:  SYNTHROID, LEVOTHROID Take 1 tablet (50 mcg total) by mouth daily. What changed:     medication strength  how much to take  when to take this   lovastatin 20 MG tablet Commonly known as:  MEVACOR Take 20 mg by mouth at bedtime.   MULTIVITAMIN PO Take 1 tablet by mouth daily.   ondansetron 4 MG tablet Commonly known as:  ZOFRAN Take 1 tablet (4 mg total) by mouth every 8 (eight) hours as needed for up to 7 days for nausea or vomiting.   oxyCODONE 5 MG immediate release tablet Commonly known as:  Oxy IR/ROXICODONE Take 1 pills every 4-6 hrs as needed for pain   polyethylene glycol packet Commonly known as:  MIRALAX / GLYCOLAX Take 17 g by mouth daily. Start taking on:  10/24/2017   ranitidine 150 MG tablet Commonly known as:  ZANTAC Take 150 mg by mouth daily as needed for heartburn.   senna-docusate 8.6-50 MG tablet Commonly known as:  Senokot-S Take 1 tablet by mouth 2 (two) times daily.   vitamin B-12 1000 MCG tablet Commonly known as:  CYANOCOBALAMIN Take 1,000 mcg by mouth daily.  No Known Allergies  Contact information for follow-up providers    Hiram Gash, MD Follow up in 2 week(s).   Specialty:  Orthopedic Surgery Why:  for incision check and XR Contact information: [address] [place] [address] [phone]        Heywood Bene, PA-C Follow up.   Specialty:  Physician Assistant Contact information: [address] [place] [address] [phone]            Contact information for after-discharge care    Destination    [facility] Preferred SNF .   Service:  Skilled Nursing Contact information: [place] [phone]                   The results of significant diagnostics from this hospitalization (including imaging, microbiology, ancillary and laboratory) are listed below for reference.    Significant Diagnostic Studies: Pelvis Portable  Result Date: 10/20/2017 CLINICAL DATA:  Left hip replacement  EXAM: PORTABLE PELVIS 1-2 VIEWS COMPARISON:  None. FINDINGS: Patient is status post left hip replacement. Soft tissue gas is consistent with the recent surgery. Hardware is in good position. A more remote right hip replacement is identified as well. IMPRESSION: Left hip replacement as above. Electronically Signed   By: Dorise Bullion III M.D   On: 10/20/2017 11:35   Dg Hip Unilat W Or Wo Pelvis 2-3 Views Left  Result Date: 10/19/2017 CLINICAL DATA:  Status post fall. Painful left hip. History of previous right hip replacement following fracture. EXAM: DG HIP (WITH OR WITHOUT PELVIS) 2-3V LEFT COMPARISON:  Right hip series of January 19, 2016 FINDINGS: There is an acute fracture of the base of the neck of the left femur. The femoral head remains appropriately position within the acetabulum. No intertrochanteric fracture is observed. The sub trochanteric region is normal. There is a prosthetic right hip. No pelvic fracture is observed. IMPRESSION: Acute fracture involving the base of the neck of the left femur. Previous right hip replacement. Electronically Signed   By: David  Martinique M.D.   On: 10/19/2017 12:51   Dg Femur Min 2 Views Left  Result Date: 10/19/2017 CLINICAL DATA:  Status post fall with painful left hip. EXAM: LEFT FEMUR 2 VIEWS COMPARISON:  AP pelvis dated January 20, 2016 FINDINGS: The patient has sustained an acute fracture of the base and mid shaft of the neck of the left femur. The femoral head remains appropriately positioned with respect to the acetabulum. The intertrochanteric and subtrochanteric regions are normal. The observed portions of the left knee are normal for age. IMPRESSION: Acute fracture involving the mid and basilar portions of the left femoral neck without an intertrochanteric component. No dislocation of the femoral head. Electronically Signed   By: David  Martinique M.D.   On: 10/19/2017 12:53    Microbiology: Recent Results (from the past 240 hour(s))  Surgical pcr  screen     Status: None   Collection Time: 10/20/17  2:48 AM  Result Value Ref Range Status   MRSA, PCR NEGATIVE NEGATIVE Final   Staphylococcus aureus NEGATIVE NEGATIVE Final    Comment: (NOTE) The Xpert SA Assay (FDA approved for NASAL specimens in patients 15 years of age and older), is one component of a comprehensive surveillance program. It is not intended to diagnose infection nor to guide or monitor treatment. Performed at [facility], [place] [address], [place], [place] [address]  Labs: Basic Metabolic Panel: Recent Labs  Lab 10/19/17 1126 10/20/17 1124 10/21/17 0447 10/22/17 0356  NA 143  --  141 138  K 3.8  --  4.4 3.6  CL 109  --  107 105  CO2 27  --  26 25  GLUCOSE 101*  --  111* 112*  BUN 15  --  12 11  CREATININE 1.01* 1.00 1.00 0.88  CALCIUM 9.0  --  8.6* 8.3*  MG  --   --   --  2.1   Liver Function Tests: Recent Labs  Lab 10/19/17 1126  AST 19  ALT 13  ALKPHOS 58  BILITOT 0.7  PROT 7.0  ALBUMIN 3.5   No results for input(s): LIPASE, AMYLASE in the last 168 hours. No results for input(s): AMMONIA in the last 168 hours. CBC: Recent Labs  Lab 10/19/17 1126 10/20/17 1124 10/21/17 0447 10/22/17 0356 10/23/17 0413  WBC 4.9 8.2 7.3 7.1 7.3  NEUTROABS 4.0  --   --   --   --   HGB 12.2 12.6 11.7* 11.1* 9.9*  HCT 36.2 38.7 35.6* 34.4* 30.5*  MCV 88.5 91.1 90.8 89.8 90.8  PLT 170 143* 160 159 151   Cardiac Enzymes: No results for input(s): CKTOTAL, CKMB, CKMBINDEX, TROPONINI in the last 168 hours. BNP: BNP (last 3 results) No results for input(s): BNP in the last 8760 hours.  ProBNP (last 3 results) No results for input(s): PROBNP in the last 8760 hours.  CBG: Recent Labs  Lab 10/19/17 1130  GLUCAP 96       Signed:  Florencia Reasons MD, PhD  [facility] 10/23/2017, 2:21 PM

## 2017-10-23 NOTE — Progress Notes (Signed)
CSW called [facility] to advised that the patient is no longer going to [facility], she will be going to [facility].  Thurmond Butts, [place] Social Worker [phone]

## 2017-10-23 NOTE — Clinical Social Work Note (Signed)
CSW received call from patient's daughter Amber [name] regarding facility choice. Per daughter, she and patient have now chosen [facility] or [facility] as preferences for rehab. Daughter informed that patient's information would be sent out to these facilities. CSW Caren Griffins contacted and informed, and patient's clinicals sent out to facilities as requested. and informed.   Amber [name], MSW, LCSW Licensed Clinical Social Worker [place] [phone]

## 2018-10-21 ENCOUNTER — Emergency Department (HOSPITAL_COMMUNITY): Payer: Medicare Other

## 2018-10-21 ENCOUNTER — Emergency Department (HOSPITAL_COMMUNITY)
Admission: EM | Admit: 2018-10-21 | Discharge: 2018-10-21 | Disposition: A | Discharge status: Home / Self Care | Payer: Medicare Other | Attending: Emergency Medicine | Admitting: Emergency Medicine

## 2018-10-21 ENCOUNTER — Other Ambulatory Visit: Payer: Self-pay

## 2018-10-21 ENCOUNTER — Encounter (HOSPITAL_COMMUNITY): Payer: Self-pay | Admitting: Emergency Medicine

## 2018-10-21 DIAGNOSIS — Y92129 Unspecified place in nursing home as the place of occurrence of the external cause: Secondary | ICD-10-CM | POA: Diagnosis not present

## 2018-10-21 DIAGNOSIS — W19XXXA Unspecified fall, initial encounter: Secondary | ICD-10-CM | POA: Insufficient documentation

## 2018-10-21 DIAGNOSIS — S52025A Nondisplaced fracture of olecranon process without intraarticular extension of left ulna, initial encounter for closed fracture: Secondary | ICD-10-CM | POA: Insufficient documentation

## 2018-10-21 DIAGNOSIS — S0990XA Unspecified injury of head, initial encounter: Secondary | ICD-10-CM | POA: Diagnosis present

## 2018-10-21 DIAGNOSIS — Z96643 Presence of artificial hip joint, bilateral: Secondary | ICD-10-CM | POA: Diagnosis not present

## 2018-10-21 DIAGNOSIS — Y999 Unspecified external cause status: Secondary | ICD-10-CM | POA: Insufficient documentation

## 2018-10-21 DIAGNOSIS — Y939 Activity, unspecified: Secondary | ICD-10-CM | POA: Diagnosis not present

## 2018-10-21 DIAGNOSIS — Z79899 Other long term (current) drug therapy: Secondary | ICD-10-CM | POA: Diagnosis not present

## 2018-10-21 DIAGNOSIS — Z87891 Personal history of nicotine dependence: Secondary | ICD-10-CM | POA: Diagnosis not present

## 2018-10-21 DIAGNOSIS — I10 Essential (primary) hypertension: Secondary | ICD-10-CM | POA: Insufficient documentation

## 2018-10-21 DIAGNOSIS — E039 Hypothyroidism, unspecified: Secondary | ICD-10-CM | POA: Insufficient documentation

## 2018-10-21 DIAGNOSIS — S0003XA Contusion of scalp, initial encounter: Secondary | ICD-10-CM | POA: Diagnosis not present

## 2018-10-21 DIAGNOSIS — S42402A Unspecified fracture of lower end of left humerus, initial encounter for closed fracture: Secondary | ICD-10-CM

## 2018-10-21 MED ORDER — HYDROCODONE-ACETAMINOPHEN 5-325 MG PO TABS
1.0000 | ORAL_TABLET | Freq: Once | ORAL | Status: AC
  Administered 2018-10-21: 1 via ORAL
  Filled 2018-10-21: qty 1

## 2018-10-21 NOTE — ED Notes (Signed)
Pt c/o right side pains and requesting imaging to be done. Dr Vanita Panda aware. New orders to be placed.

## 2018-10-21 NOTE — ED Triage Notes (Signed)
Per [facility] pt from [facility] for fall this morning. Has hematoma on right side of head. Pt has multiple bruises all over body in different stages of healing from multiple falls. Pt right pupil is slightly less dilated from left pupil. daughter at scene nor staff at facility were unsure if that was baseline for her or not. Pt c/o left elbow pains and pains all over. Denies taking blood thinners.

## 2018-10-21 NOTE — ED Notes (Signed)
ED Provider at bedside. 

## 2018-10-21 NOTE — ED Notes (Signed)
Daughter Beverlee Nims, [place], at work and wanted this RN to call her husband Darnelle Maffucci with discharge instructions. Darnelle Maffucci was asking this RN to send picture of patient's discharge instructions via text message. Informed Darnelle Maffucci that can't send patient's private health information via text message. Sent [place] picture of how to set up My Chart.

## 2018-10-21 NOTE — ED Notes (Addendum)
Pt states that she doesn't have any balance for couple years and nobody can figure out why.

## 2018-10-21 NOTE — ED Notes (Signed)
Patient transported to X-ray 

## 2018-10-21 NOTE — ED Notes (Signed)
Pt daughter called asking for pt to be sent back to SNF with a bedside commode pot as the [facility] she has at facility doesn't have one.

## 2018-10-21 NOTE — ED Notes (Signed)
Ortho at bedside applying arm splint

## 2018-10-21 NOTE — ED Notes (Signed)
Made [facility] aware of transport needed back to facility.

## 2018-10-21 NOTE — Discharge Instructions (Addendum)
As discussed, today's evaluation has been generally reassuring, though there was a left elbow fracture demonstrated on your x-rays. Please be sure to schedule follow-up with your orthopedic physician in 1 week.  Return here for any concerning changes in your condition.

## 2018-10-21 NOTE — ED Provider Notes (Signed)
[name] DEPT Provider Note   CSN: [id number] Arrival date & time: 10/21/18  [id number]     History   Chief Complaint Chief Complaint  Patient presents with   Fall   Head Injury   Elbow Pain   Generalized Body Aches    HPI Amber [name] is a 85 y.o. female.     HPI Patient presents after a fall with pain in her right parietal region, left elbow. Patient has multiple medical problems, including frequent falls, ongoing gait instability. This latter issue has been studied, evaluated by multiple practitioners, and has unclear etiology. Patient notes that she was in her usual state of health when she stumbled, falling, striking her head, elbow, just prior to arrival. She has a sore sharp severe pain in both of these regions, though no loss of consciousness, no nausea, vomiting, new weakness in any extremity. No hip pain, and she has been able to bear weight since the fall. She takes tramadol for pain control, is unclear if she has received additional doses of this medication following today's event. 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   Thyroid disease     Patient Active Problem List   Diagnosis Date Noted   Proximal Femur Fracture, left /Mechanical Fall 10/19/2017   Femur fracture ([place]) 10/19/2017   Dizziness 01/19/2016   Fall 01/19/2016   Closed right hip fracture ([place]) 01/19/2016   Hypertension 01/19/2016   Hypothyroidism 01/19/2016   Hyperlipidemia 01/19/2016   Hip fracture ([place]) 01/19/2016   Status post laparoscopic cholecystectomy 03/27/2013    Past Surgical History:  Procedure Laterality Date   ABDOMINAL HYSTERECTOMY     CHOLECYSTECTOMY N/A 03/27/2013   Procedure: LAPAROSCOPIC CHOLECYSTECTOMY WITH INTRAOPERATIVE CHOLANGIOGRAM;  Surgeon: Pedro Earls, MD;   Location: [facility];  Service: General;  Laterality: N/A;   HIP ARTHROPLASTY Right 01/20/2016   Procedure: ARTHROPLASTY BIPOLAR HIP (HEMIARTHROPLASTY);  Surgeon: Renette Butters, MD;  Location: [facility];  Service: Orthopedics;  Laterality: Right;   HIP ARTHROPLASTY Left 10/20/2017   Procedure: ARTHROPLASTY BIPOLAR HIP (HEMIARTHROPLASTY);  Surgeon: Hiram Gash, MD;  Location: [place] [facility];  Service: Orthopedics;  Laterality: Left;   KNEE SURGERY Left    approx 4 surgeries on left knee     OB History   No obstetric history on file.      Home Medications    Prior to Admission medications   Medication Sig Start Date End Date Taking? Authorizing Provider  acetaminophen (TYLENOL) 500 MG tablet Take 1,000 mg by mouth every 8 (eight) hours as needed for mild pain.   Yes [provider]  famotidine (PEPCID) 20 MG tablet Take 20 mg by mouth 2 (two) times daily.   Yes [provider]  lovastatin (MEVACOR) 20 MG tablet Take 20 mg by mouth at bedtime. 11/17/15  Yes [provider]  Multiple Vitamins-Minerals (MULTIVITAMIN PO) Take 1 tablet by mouth daily.   Yes [provider]  polyethylene glycol (MIRALAX / GLYCOLAX) packet Take 17 g by mouth daily. 10/24/17  Yes Florencia Reasons, MD  senna-docusate (SENOKOT-S) 8.6-50 MG tablet Take 1 tablet by mouth 2 (two) times daily. 10/23/17  Yes Florencia Reasons, MD  traMADol (ULTRAM) 50 MG tablet Take 50 mg by mouth 3 (three) times daily as needed for pain.  10/16/18  Yes [provider]  vitamin B-12 (CYANOCOBALAMIN) 1000 MCG tablet Take 1,000 mcg by mouth daily.   Yes [provider]  enoxaparin (LOVENOX) 40 MG/0.4ML injection Inject 0.4 mLs (40 mg total) into the skin daily. Patient not taking: Reported on 10/21/2018 10/21/17 10/21/18  Hiram Gash, MD  levothyroxine (SYNTHROID) 50 MCG tablet Take 1 tablet (50 mcg total) by mouth daily. Patient not taking: Reported on 10/21/2018 10/23/17 10/23/18  Florencia Reasons, MD  oxyCODONE (OXY IR/ROXICODONE)  5 MG immediate release tablet Take 1 pills every 4-6 hrs as needed for pain Patient not taking: Reported on 10/21/2018 10/21/17   Hiram Gash, MD    Family History Family History  Problem Relation Age of Onset   Lung cancer Sister    Bone cancer Brother    Stomach cancer Sister     Social History Social History   Tobacco Use   Smoking status: Former Smoker    Quit date: 03/27/1983    Years since quitting: 35.5   Smokeless tobacco: Never Used  Substance Use Topics   Alcohol use: No   Drug use: No     Allergies   Patient has no known allergies.   Review of Systems Review of Systems  Constitutional:       Per HPI, otherwise negative  HENT:       Per HPI, otherwise negative  Respiratory:       Per HPI, otherwise negative  Cardiovascular:       Per HPI, otherwise negative  Gastrointestinal: Negative for vomiting.  Endocrine:       Negative aside from HPI  Genitourinary:       Neg aside from HPI   Musculoskeletal:       Per HPI, otherwise negative  Skin: Negative.   Neurological: Positive for dizziness and weakness. Negative for syncope.     Physical Exam Updated Vital Signs BP (!) 165/88    Pulse 82    Temp 98.7 F (37.1 C) (Oral)    Resp (!) 21    SpO2 98%   Physical Exam Vitals signs and nursing note reviewed.  Constitutional:      General: She is not in acute distress.    Appearance: She is well-developed.     Comments: Chronically unwell appearing elderly female awake and alert  HENT:     Head: Normocephalic.   Eyes:     Conjunctiva/sclera: Conjunctivae normal.  Neck:     Musculoskeletal: No spinous process tenderness or muscular tenderness.  Cardiovascular:     Rate and Rhythm: Normal rate and regular rhythm.  Pulmonary:     Effort: Pulmonary effort is normal. No respiratory distress.     Breath sounds: Normal breath sounds. No stridor.  Abdominal:     General: There is no distension.  Musculoskeletal:     Left shoulder: Normal.      Left elbow: She exhibits decreased range of motion. Tenderness found. Olecranon process tenderness noted.     Left wrist: Normal.     Right hip: Normal.     Left hip: Normal.  Skin:    General: Skin is warm and dry.  Neurological:     Mental Status: She is alert and oriented to person, place, and time.     Cranial Nerves: No cranial nerve deficit.     Motor: Atrophy present.      ED Treatments / Results  Labs (all labs ordered are listed, but only abnormal results are displayed) Labs  Reviewed - No data to display  EKG None  Radiology Dg Ribs Unilateral W/chest Right  Result Date: 10/21/2018 CLINICAL DATA:  Pt fell today. States she hurts all over with pain greatest in region or right side of pelvis and flank region with BB marker EXAM: RIGHT RIBS AND CHEST - 3+ VIEW COMPARISON:  Chest radiograph 01/19/2016 FINDINGS: No evidence of a displaced right-sided rib fracture. Stable cardiomediastinal contours. There are aortic arch calcifications. The lungs are clear. No pneumothorax or large pleural effusion. Osteopenia. IMPRESSION: No evidence of a displaced right-sided rib fracture. No pneumothorax. Electronically Signed   By: Audie Pinto M.D.   On: 10/21/2018 12:01   Dg Elbow Complete Left  Result Date: 10/21/2018 CLINICAL DATA:  Recent fall now with inability to extend the left arm. EXAM: LEFT ELBOW - COMPLETE 3+ VIEW COMPARISON:  None. FINDINGS: Examination is degraded due to obliquity and patient's inability tolerate positioning. Suspected nondisplaced fracture involving the olecranon process with associated adjacent soft tissue swelling. No definite elbow joint effusion, though note, evaluation degraded due to obliquity. No radiopaque foreign body. IMPRESSION: Suspected nondisplaced fracture involving the olecranon process of the elbow on this degraded examination. Correlation for point tenderness at this location is advised. Electronically Signed   By: Sandi Mariscal M.D.   On:  10/21/2018 10:13   Ct Head Wo Contrast  Result Date: 10/21/2018 CLINICAL DATA:  Fall this morning, now with hematoma involving the right-side of head. EXAM: CT HEAD WITHOUT CONTRAST TECHNIQUE: Contiguous axial images were obtained from the base of the skull through the vertex without intravenous contrast. COMPARISON:  03/19/2016 FINDINGS: Brain: Redemonstrated atrophy and periventricular hypodensities compatible with microvascular ischemic disease. No CT evidence of acute large territory infarct. No intraparenchymal or extra-axial mass or hemorrhage. Normal size and configuration of the ventricles and the basilar cisterns. No midline shift. Vascular: Minimal intracranial atherosclerosis. Skull: No displaced calvarial fracture with special attention paid to the area subjacent to the calvarial hematoma. Sinuses/Orbits: Limited visualization the paranasal sinuses and mastoid air cells is normal. No air-fluid levels. Other: Crescentic hematoma about the right frontoparietal calvarium measuring approximately 7.2 x 0.9 cm (image 23, series 2) without associated radiopaque foreign body. IMPRESSION: 1. Crescentic hematoma about the right frontal-parietal calvarium without associated fracture, radiopaque foreign body or acute intracranial process. 2. Similar findings of atrophy and microvascular ischemic disease. Electronically Signed   By: Sandi Mariscal M.D.   On: 10/21/2018 10:18   Dg Hip Port Unilat W Or Wo Pelvis 1 View Right  Result Date: 10/21/2018 CLINICAL DATA:  Fall.  Pain all over. EXAM: DG HIP (WITH OR WITHOUT PELVIS) 1V PORT RIGHT COMPARISON:  None. FINDINGS: Previous bilateral hip arthroplasty. The right hip appears located. There is no acute fracture or dislocation. IMPRESSION: 1. No acute findings. 2. Status post bilateral hip arthroplasty. Electronically Signed   By: Kerby Moors M.D.   On: 10/21/2018 12:03    Procedures Procedures (including critical care time)  Medications Ordered in  ED Medications  HYDROcodone-acetaminophen (NORCO/VICODIN) 5-325 MG per tablet 1 tablet (1 tablet Oral Given 10/21/18 1040)     Initial Impression / Assessment and Plan / ED Course  I have reviewed the triage vital signs and the nursing notes.  Pertinent labs & imaging results that were available during my care of the patient were reviewed by me and considered in my medical decision making (see chart for details).    On repeat exam the patient continues to complain of pain, now on her  right side, requests x-rays.   Patient has had successful splinting of her left olecranon fracture. On exam she notes that she has had no falls recently, that she is not sure when the elbow began to hurt, but it seems to have been more of an issue before today's fall. She remains awake and alert, moving all extremities spontaneously.    1:31 PM Patient awake, alert, smiling. Hemodynamically she is unremarkable. I reviewed all x-rays, CT with her again, no evidence for fracture, intracranial hemorrhage beyond her elbow. Patient has had appropriate splint placement by orthopedic technician On repeat exam has preserved distal neurovascular status. Patient has no complaints of dyspnea, nor fever, no cough, no evidence for concurrent infection, hemodynamic stability, will return to her nursing facility.  Final Clinical Impressions(s) / ED Diagnoses   Final diagnoses:  Fall, initial encounter  Closed fracture of left elbow, initial encounter      Carmin Muskrat, MD 10/21/18 1332

## 2018-10-21 NOTE — ED Notes (Signed)
Pt's daughter Beverlee Nims called wanting an update on patient. Informed her that waiting a few more test to come back.

## 2018-12-10 ENCOUNTER — Emergency Department (HOSPITAL_COMMUNITY): Payer: Medicare Other

## 2018-12-10 ENCOUNTER — Other Ambulatory Visit: Payer: Self-pay

## 2018-12-10 ENCOUNTER — Emergency Department (HOSPITAL_COMMUNITY)
Admission: EM | Admit: 2018-12-10 | Discharge: 2018-12-10 | Disposition: A | Discharge status: Home / Self Care | Payer: Medicare Other | Attending: Emergency Medicine | Admitting: Emergency Medicine

## 2018-12-10 ENCOUNTER — Encounter (HOSPITAL_COMMUNITY): Payer: Self-pay

## 2018-12-10 DIAGNOSIS — Y9389 Activity, other specified: Secondary | ICD-10-CM | POA: Diagnosis not present

## 2018-12-10 DIAGNOSIS — Z85831 Personal history of malignant neoplasm of soft tissue: Secondary | ICD-10-CM | POA: Insufficient documentation

## 2018-12-10 DIAGNOSIS — W19XXXA Unspecified fall, initial encounter: Secondary | ICD-10-CM

## 2018-12-10 DIAGNOSIS — Y92199 Unspecified place in other specified residential institution as the place of occurrence of the external cause: Secondary | ICD-10-CM | POA: Diagnosis not present

## 2018-12-10 DIAGNOSIS — W1839XA Other fall on same level, initial encounter: Secondary | ICD-10-CM | POA: Insufficient documentation

## 2018-12-10 DIAGNOSIS — S3992XA Unspecified injury of lower back, initial encounter: Secondary | ICD-10-CM | POA: Diagnosis present

## 2018-12-10 DIAGNOSIS — Y999 Unspecified external cause status: Secondary | ICD-10-CM | POA: Insufficient documentation

## 2018-12-10 DIAGNOSIS — Z87891 Personal history of nicotine dependence: Secondary | ICD-10-CM | POA: Diagnosis not present

## 2018-12-10 DIAGNOSIS — E039 Hypothyroidism, unspecified: Secondary | ICD-10-CM | POA: Diagnosis not present

## 2018-12-10 DIAGNOSIS — S300XXA Contusion of lower back and pelvis, initial encounter: Secondary | ICD-10-CM | POA: Insufficient documentation

## 2018-12-10 DIAGNOSIS — S0990XA Unspecified injury of head, initial encounter: Secondary | ICD-10-CM | POA: Diagnosis not present

## 2018-12-10 DIAGNOSIS — Z79899 Other long term (current) drug therapy: Secondary | ICD-10-CM | POA: Insufficient documentation

## 2018-12-10 DIAGNOSIS — I10 Essential (primary) hypertension: Secondary | ICD-10-CM | POA: Insufficient documentation

## 2018-12-10 DIAGNOSIS — Z96643 Presence of artificial hip joint, bilateral: Secondary | ICD-10-CM | POA: Insufficient documentation

## 2018-12-10 NOTE — Discharge Instructions (Addendum)
Your evaluated for fall today.  Your x-rays and CT scans did not show any breaks or fractures or dislocations.  You probably have a bruised tailbone.  Please make sure when you are sitting down you use a thick cushion to provide you some support.  Thank you for allowing me to care for you today. Please return to the emergency department if you have new or worsening symptoms. Take your medications as instructed.

## 2018-12-10 NOTE — ED Triage Notes (Signed)
Pt BIB EMS from [facility] on [address]. Pt had a witnessed fall due to getting up without help. No LOC, did hit her head. No blood thinners. Pt reports tailbone pain. A&O x4  136/70 81 96% RA RR 16

## 2018-12-10 NOTE — ED Provider Notes (Signed)
[facility] DEPT Provider Note   CSN: [id number] Arrival date & time: 12/10/18  1832     History   Chief Complaint No chief complaint on file.   HPI Amber [name] is a 85 y.o. female.     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  Patient reports that she has difficulty ambulating at her baseline and this resulted in multiple falls.  Reports that she is trying to transition herself from a wheelchair to a regular chair today and she missed the chair and fell back.  Reports that she landed inside on the hardwood floor onto her tailbone and then fell back and struck her head on the door.  Denies any loss of consciousness.  Reporting that she is having tailbone pain and right side pain.  She is not on any anticoagulation.  She is declining any medication for pain right now just wanted to be checked out for reassurance.     Past Medical History:  Diagnosis Date  . Complication of anesthesia    "woke up jumping and bouncing"  . Hyperlipidemia   . Hypertension   . 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 Thyroid disease     Patient Active Problem List   Diagnosis Date Noted  . Proximal Femur Fracture, left Randel Books Fall 10/19/2017  . Femur fracture ([place]) 10/19/2017  . Dizziness 01/19/2016  . Fall 01/19/2016  . Closed right hip fracture ([place]) 01/19/2016  . Hypertension 01/19/2016  . Hypothyroidism 01/19/2016  . Hyperlipidemia 01/19/2016  . Hip fracture ([place]) 01/19/2016  . Status post laparoscopic cholecystectomy 03/27/2013    Past Surgical History:  Procedure Laterality Date  . ABDOMINAL HYSTERECTOMY    . CHOLECYSTECTOMY N/A 03/27/2013   Procedure: LAPAROSCOPIC  CHOLECYSTECTOMY WITH INTRAOPERATIVE CHOLANGIOGRAM;  Surgeon: Pedro Earls, MD;  Location: [facility];  Service: General;  Laterality: N/A;  . HIP ARTHROPLASTY Right 01/20/2016   Procedure: ARTHROPLASTY BIPOLAR HIP (HEMIARTHROPLASTY);  Surgeon: Renette Butters, MD;  Location: [facility];  Service: Orthopedics;  Laterality: Right;  . HIP ARTHROPLASTY Left 10/20/2017   Procedure: ARTHROPLASTY BIPOLAR HIP (HEMIARTHROPLASTY);  Surgeon: Hiram Gash, MD;  Location: [facility];  Service: Orthopedics;  Laterality: Left;  . KNEE SURGERY Left    approx 4 surgeries on left knee     OB History   No obstetric history on file.      Home Medications    Prior to Admission medications   Medication Sig Start Date End Date Taking? Authorizing Provider  acetaminophen (TYLENOL) 500 MG tablet Take 1,000 mg by mouth every 8 (eight) hours as needed for mild pain.    [provider]  enoxaparin (LOVENOX) 40 MG/0.4ML injection Inject 0.4 mLs (40 mg total) into the skin daily. Patient not taking: Reported on 10/21/2018 10/21/17 10/21/18  Hiram Gash, MD  famotidine (PEPCID) 20 MG tablet Take 20 mg by mouth 2 (two) times daily.    [provider]  levothyroxine (SYNTHROID) 50 MCG tablet Take 1 tablet (50 mcg total) by mouth daily. Patient not taking: Reported on 10/21/2018 10/23/17 10/23/18  Florencia Reasons, MD  lovastatin (MEVACOR) 20 MG tablet Take 20 mg by mouth at bedtime. 11/17/15   [provider]  Multiple Vitamins-Minerals (MULTIVITAMIN  PO) Take 1 tablet by mouth daily.    [provider]  oxyCODONE (OXY IR/ROXICODONE) 5 MG immediate release tablet Take 1 pills every 4-6 hrs as needed for pain Patient not taking: Reported on 10/21/2018 10/21/17   Hiram Gash, MD  polyethylene glycol (MIRALAX / Floria Raveling) packet Take 17 g by mouth daily. 10/24/17   Florencia Reasons, MD  senna-docusate (SENOKOT-S) 8.6-50 MG tablet Take 1 tablet by mouth 2 (two) times daily. 10/23/17   Florencia Reasons, MD  traMADol (ULTRAM) 50 MG tablet  Take 50 mg by mouth 3 (three) times daily as needed for pain. 10/16/18   [provider]  vitamin B-12 (CYANOCOBALAMIN) 1000 MCG tablet Take 1,000 mcg by mouth daily.    [provider]    Family History Family History  Problem Relation Age of Onset  . Lung cancer Sister   . Bone cancer Brother   . Stomach cancer Sister     Social History Social History   Tobacco Use  . Smoking status: Former Smoker    Quit date: 03/27/1983    Years since quitting: 35.7  . Smokeless tobacco: Never Used  Substance Use Topics  . Alcohol use: No  . Drug use: No     Allergies   Patient has no known allergies.   Review of Systems Review of Systems  Constitutional: Negative for fever.  HENT: Negative for nosebleeds.   Respiratory: Negative for cough and shortness of breath.   Cardiovascular: Negative for chest pain.  Gastrointestinal: Negative for nausea and vomiting.  Musculoskeletal: Positive for arthralgias and back pain. Negative for gait problem, joint swelling, myalgias, neck pain and neck stiffness.  Skin: Negative for rash.  Neurological: Negative for dizziness, light-headedness and headaches.  Psychiatric/Behavioral: Negative for confusion.     Physical Exam Updated Vital Signs BP (!) 119/57   Pulse 69   Temp 98.5 F (36.9 C) (Oral)   Resp 16   SpO2 99%   Physical Exam Vitals signs and nursing note reviewed.  Constitutional:      General: She is not in acute distress.    Appearance: Normal appearance. She is not ill-appearing, toxic-appearing or diaphoretic.  HENT:     Head: Normocephalic and atraumatic. No raccoon eyes, Battle's sign, abrasion, contusion, masses or laceration.     Jaw: There is normal jaw occlusion.     Nose: Nose normal.     Mouth/Throat:     Mouth: Mucous membranes are moist.  Eyes:     Conjunctiva/sclera: Conjunctivae normal.  Neck:     Musculoskeletal: Full passive range of motion without pain. No edema, pain with movement,  spinous process tenderness or muscular tenderness.     Trachea: Trachea normal.  Cardiovascular:     Rate and Rhythm: Normal rate and regular rhythm.  Pulmonary:     Effort: Pulmonary effort is normal.     Breath sounds: Normal breath sounds.  Chest:    Abdominal:     General: Abdomen is flat. Bowel sounds are normal. There is no distension.     Tenderness: There is no abdominal tenderness.  Musculoskeletal:     Thoracic back: Normal.     Lumbar back: She exhibits tenderness and pain. She exhibits no bony tenderness and no swelling.       Back:  Skin:    General: Skin is warm and dry.     Findings: No bruising or erythema.  Neurological:     General: No focal deficit present.  Mental Status: She is alert and oriented to person, place, and time.  Psychiatric:        Mood and Affect: Mood normal.      ED Treatments / Results  Labs (all labs ordered are listed, but only abnormal results are displayed) Labs Reviewed - No data to display  EKG None  Radiology Dg Chest 2 View  Result Date: 12/10/2018 CLINICAL DATA:  Fall. EXAM: CHEST - 2 VIEW COMPARISON:  Chest x-ray dated October 21, 2018. FINDINGS: The heart size and mediastinal contours are within normal limits. Both lungs are clear. The visualized skeletal structures are unremarkable. IMPRESSION: No active cardiopulmonary disease. Electronically Signed   By: Titus Dubin M.D.   On: 12/10/2018 20:09   Dg Lumbar Spine Complete  Result Date: 12/10/2018 CLINICAL DATA:  Low back pain after fall. EXAM: LUMBAR SPINE - COMPLETE 4+ VIEW COMPARISON:  CT abdomen pelvis dated March 26, 2013. FINDINGS: There is no evidence of lumbar spine fracture. Mild dextroscoliosis. Sagittal alignment is normal. Intervertebral disc spaces are maintained. Aortoiliac atherosclerosis. Prior cholecystectomy. Prior bilateral hip arthroplasties. IMPRESSION: 1.  No acute osseous abnormality. Electronically Signed   By: Titus Dubin M.D.   On:  12/10/2018 20:11   Ct Head Wo Contrast  Result Date: 12/10/2018 CLINICAL DATA:  85 year old female with head trauma. EXAM: CT HEAD WITHOUT CONTRAST CT CERVICAL SPINE WITHOUT CONTRAST TECHNIQUE: Multidetector CT imaging of the head and cervical spine was performed following the standard protocol without intravenous contrast. Multiplanar CT image reconstructions of the cervical spine were also generated. COMPARISON:  Head CT dated 10/21/2018 FINDINGS: CT HEAD FINDINGS Brain: There is moderate age-related atrophy and chronic microvascular ischemic changes. There is no acute intracranial hemorrhage. No mass effect or midline shift. No extra-axial fluid collection. Vascular: No hyperdense vessel or unexpected calcification. Skull: Normal. Negative for fracture or focal lesion. Sinuses/Orbits: No acute finding. Other: None CT CERVICAL SPINE FINDINGS Alignment: Normal. Skull base and vertebrae: No acute fracture. No primary bone lesion or focal pathologic process. Soft tissues and spinal canal: No prevertebral fluid or swelling. No visible canal hematoma. Disc levels:  No acute findings. Upper chest: Emphysema with biapical subpleural scarring. Other: Bilateral carotid bulb calcified plaques. A 2 cm right thyroid hyperdense nodule. IMPRESSION: 1. No acute intracranial hemorrhage. 2. No acute/traumatic cervical spine pathology. Electronically Signed   By: Anner Crete M.D.   On: 12/10/2018 20:42   Ct Cervical Spine Wo Contrast  Result Date: 12/10/2018 CLINICAL DATA:  85 year old female with head trauma. EXAM: CT HEAD WITHOUT CONTRAST CT CERVICAL SPINE WITHOUT CONTRAST TECHNIQUE: Multidetector CT imaging of the head and cervical spine was performed following the standard protocol without intravenous contrast. Multiplanar CT image reconstructions of the cervical spine were also generated. COMPARISON:  Head CT dated 10/21/2018 FINDINGS: CT HEAD FINDINGS Brain: There is moderate age-related atrophy and chronic  microvascular ischemic changes. There is no acute intracranial hemorrhage. No mass effect or midline shift. No extra-axial fluid collection. Vascular: No hyperdense vessel or unexpected calcification. Skull: Normal. Negative for fracture or focal lesion. Sinuses/Orbits: No acute finding. Other: None CT CERVICAL SPINE FINDINGS Alignment: Normal. Skull base and vertebrae: No acute fracture. No primary bone lesion or focal pathologic process. Soft tissues and spinal canal: No prevertebral fluid or swelling. No visible canal hematoma. Disc levels:  No acute findings. Upper chest: Emphysema with biapical subpleural scarring. Other: Bilateral carotid bulb calcified plaques. A 2 cm right thyroid hyperdense nodule. IMPRESSION: 1. No acute intracranial hemorrhage. 2. No acute/traumatic  cervical spine pathology. Electronically Signed   By: Anner Crete M.D.   On: 12/10/2018 20:42    Procedures Procedures (including critical care time)  Medications Ordered in ED Medications - No data to display   Initial Impression / Assessment and Plan / ED Course  I have reviewed the triage vital signs and the nursing notes.  Pertinent labs & imaging results that were available during my care of the patient were reviewed by me and considered in my medical decision making (see chart for details).  Clinical Course as of Dec 10 2334  Tue Dec 09, 5980  5851 85 year old female from assisted living presenting for evaluation after mechanical fall.  Low impact.  Will obtain CT head, CT C-spine, chest x-ray and lumbar spine.  Patient's only complaint is her tailbone and her right side hurt.  Not on anticoagulation and declines any medication for pain at this time.   [KM]  2050 Workup is negative for acute injury. Patient at baseline. Will be d/c in stable condition. Advised to use thick cushion to sit on as well as tylenol for pain in the tail bone   [KM]    Clinical Course User Index [KM] Alveria Apley, PA-C        Based on review of vitals, medical screening exam, lab work and/or imaging, there does not appear to be an acute, emergent etiology for the patient's symptoms. Counseled pt on good return precautions and encouraged both PCP and ED follow-up as needed.  Prior to discharge, I also discussed incidental imaging findings with patient in detail and advised appropriate, recommended follow-up in detail.  Clinical Impression: 1. Fall, initial encounter   2. Contusion of sacrum, initial encounter     Disposition: Discharge  Prior to providing a prescription for a controlled substance, I independently reviewed the patient's recent prescription history on the [facility]. The patient had no recent or regular prescriptions and was deemed appropriate for a brief, less than 3 day prescription of narcotic for acute analgesia.  This note was prepared with assistance of Systems analyst. Occasional wrong-word or sound-a-like substitutions may have occurred due to the inherent limitations of voice recognition software.]   Final Clinical Impressions(s) / ED Diagnoses   Final diagnoses:  Fall, initial encounter  Contusion of sacrum, initial encounter    ED Discharge Orders    None       Kristine Royal 12/10/18 2337    Lacretia Leigh, MD 12/12/18 1350

## 2019-01-10 ENCOUNTER — Other Ambulatory Visit: Payer: Self-pay

## 2019-01-10 ENCOUNTER — Encounter (HOSPITAL_COMMUNITY): Payer: Self-pay | Admitting: Emergency Medicine

## 2019-01-10 ENCOUNTER — Emergency Department (HOSPITAL_COMMUNITY): Payer: Medicare Other

## 2019-01-10 ENCOUNTER — Emergency Department (HOSPITAL_COMMUNITY)
Admission: EM | Admit: 2019-01-10 | Discharge: 2019-01-10 | Disposition: A | Discharge status: Skilled Nursing Facility | Payer: Medicare Other | Attending: Emergency Medicine | Admitting: Emergency Medicine

## 2019-01-10 DIAGNOSIS — S0990XA Unspecified injury of head, initial encounter: Secondary | ICD-10-CM

## 2019-01-10 DIAGNOSIS — Y999 Unspecified external cause status: Secondary | ICD-10-CM | POA: Diagnosis not present

## 2019-01-10 DIAGNOSIS — Y9289 Other specified places as the place of occurrence of the external cause: Secondary | ICD-10-CM | POA: Diagnosis not present

## 2019-01-10 DIAGNOSIS — Z79899 Other long term (current) drug therapy: Secondary | ICD-10-CM | POA: Diagnosis not present

## 2019-01-10 DIAGNOSIS — R296 Repeated falls: Secondary | ICD-10-CM | POA: Diagnosis not present

## 2019-01-10 DIAGNOSIS — Y9389 Activity, other specified: Secondary | ICD-10-CM | POA: Diagnosis not present

## 2019-01-10 DIAGNOSIS — E039 Hypothyroidism, unspecified: Secondary | ICD-10-CM | POA: Insufficient documentation

## 2019-01-10 DIAGNOSIS — W19XXXA Unspecified fall, initial encounter: Secondary | ICD-10-CM | POA: Insufficient documentation

## 2019-01-10 DIAGNOSIS — S40012A Contusion of left shoulder, initial encounter: Secondary | ICD-10-CM | POA: Diagnosis not present

## 2019-01-10 DIAGNOSIS — Z87891 Personal history of nicotine dependence: Secondary | ICD-10-CM | POA: Insufficient documentation

## 2019-01-10 DIAGNOSIS — Z7901 Long term (current) use of anticoagulants: Secondary | ICD-10-CM | POA: Insufficient documentation

## 2019-01-10 DIAGNOSIS — I10 Essential (primary) hypertension: Secondary | ICD-10-CM | POA: Insufficient documentation

## 2019-01-10 LAB — CBC WITH DIFFERENTIAL/PLATELET
Abs Immature Granulocytes: 0.02 10*3/uL (ref 0.00–0.07)
Basophils Absolute: 0 10*3/uL (ref 0.0–0.1)
Basophils Relative: 1 %
Eosinophils Absolute: 0.1 10*3/uL (ref 0.0–0.5)
Eosinophils Relative: 1 %
HCT: 37.3 % (ref 36.0–46.0)
Hemoglobin: 12.3 g/dL (ref 12.0–15.0)
Immature Granulocytes: 0 %
Lymphocytes Relative: 20 %
Lymphs Abs: 1.1 10*3/uL (ref 0.7–4.0)
MCH: 31 pg (ref 26.0–34.0)
MCHC: 33 g/dL (ref 30.0–36.0)
MCV: 94 fL (ref 80.0–100.0)
Monocytes Absolute: 0.4 10*3/uL (ref 0.1–1.0)
Monocytes Relative: 8 %
Neutro Abs: 4 10*3/uL (ref 1.7–7.7)
Neutrophils Relative %: 70 %
Platelets: 186 10*3/uL (ref 150–400)
RBC: 3.97 MIL/uL (ref 3.87–5.11)
RDW: 12.3 % (ref 11.5–15.5)
WBC: 5.7 10*3/uL (ref 4.0–10.5)
nRBC: 0 % (ref 0.0–0.2)

## 2019-01-10 LAB — BASIC METABOLIC PANEL
Anion gap: 8 (ref 5–15)
BUN: 14 mg/dL (ref 8–23)
CO2: 25 mmol/L (ref 22–32)
Calcium: 8.8 mg/dL — ABNORMAL LOW (ref 8.9–10.3)
Chloride: 108 mmol/L (ref 98–111)
Creatinine, Ser: 1.05 mg/dL — ABNORMAL HIGH (ref 0.44–1.00)
GFR calc Af Amer: 56 mL/min — ABNORMAL LOW (ref >60)
GFR calc non Af Amer: 49 mL/min — ABNORMAL LOW (ref >60)
Glucose, Bld: 101 mg/dL — ABNORMAL HIGH (ref 70–99)
Potassium: 4.2 mmol/L (ref 3.5–5.1)
Sodium: 141 mmol/L (ref 135–145)

## 2019-01-10 MED ORDER — ACETAMINOPHEN 325 MG PO TABS
650.0000 mg | ORAL_TABLET | Freq: Once | ORAL | Status: AC | PRN
  Administered 2019-01-10: 650 mg via ORAL
  Filled 2019-01-10: qty 2

## 2019-01-10 NOTE — ED Notes (Signed)
Beverlee Nims (daughter) would like to be contacted with any updates.

## 2019-01-10 NOTE — ED Triage Notes (Signed)
The patient is from [facility] where she had a fall that was witnessed by her husband. SHe hit her head on the posterior right side. She did not loose consciousness and was not on blood thinners. Patient also had a fall last night.   EMS Vitals: 166/100 BP 80 HR 16 Resp Rate 98% O2 sat on room air  98.1 temp

## 2019-01-10 NOTE — ED Notes (Signed)
[facility] called  

## 2019-01-10 NOTE — ED Notes (Signed)
Patient states she has been having difficulty keeping her balance, but no one has been able to determine why.

## 2019-01-10 NOTE — ED Provider Notes (Signed)
[facility] DEPT Provider Note   CSN: [id number] Arrival date & time: 01/10/19  1841     History   Chief Complaint Chief Complaint  Patient presents with  . Fall    HPI Amber [name] is a 85 y.o. female.     HPI 85 year old female presents with fall.  She fell yesterday and today.  She states over the last couple years she has been having frequent falls but with no clear cause.  She states she has had work-up for this.  The patient tells me that today she was getting close on and when she stood up to pull her pants up she fell forward.  She did not feel dizzy or lightheaded and did not pass out.  Never had chest pain or shortness of breath.  Did hit her head and can feel some swelling to the right side.  No neck pain.  Yesterday evening she injured her left upper arm.  Past Medical History:  Diagnosis Date  . Complication of anesthesia    "woke up jumping and bouncing"  . Hyperlipidemia   . Hypertension   . 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 Thyroid disease     Patient Active Problem List   Diagnosis Date Noted  . Proximal Femur Fracture, left Randel Books Fall 10/19/2017  . Femur fracture ([place]) 10/19/2017  . Dizziness 01/19/2016  . Fall 01/19/2016  . Closed right hip fracture ([place]) 01/19/2016  . Hypertension 01/19/2016  . Hypothyroidism 01/19/2016  . Hyperlipidemia 01/19/2016  . Hip fracture ([place]) 01/19/2016  . Status post laparoscopic cholecystectomy 03/27/2013    Past Surgical History:  Procedure Laterality Date  . ABDOMINAL HYSTERECTOMY    . CHOLECYSTECTOMY N/A 03/27/2013   Procedure: LAPAROSCOPIC CHOLECYSTECTOMY WITH INTRAOPERATIVE CHOLANGIOGRAM;  Surgeon: Pedro Earls, MD;  Location: [facility];  Service: General;  Laterality: N/A;  . HIP ARTHROPLASTY Right 01/20/2016   Procedure: ARTHROPLASTY BIPOLAR HIP (HEMIARTHROPLASTY);  Surgeon: Renette Butters, MD;  Location: [facility];  Service: Orthopedics;   Laterality: Right;  . HIP ARTHROPLASTY Left 10/20/2017   Procedure: ARTHROPLASTY BIPOLAR HIP (HEMIARTHROPLASTY);  Surgeon: Hiram Gash, MD;  Location: [facility];  Service: Orthopedics;  Laterality: Left;  . KNEE SURGERY Left    approx 4 surgeries on left knee     OB History   No obstetric history on file.      Home Medications    Prior to Admission medications   Medication Sig Start Date End Date Taking? Authorizing Provider  acetaminophen (TYLENOL) 500 MG tablet Take 1,000 mg by mouth every 8 (eight) hours as needed for mild pain.   Yes [provider]  famotidine (PEPCID) 20 MG tablet Take 20 mg by mouth 2 (two) times daily.   Yes [provider]  lovastatin (MEVACOR) 20 MG tablet Take 20 mg by mouth at bedtime. 11/17/15  Yes [provider]  Multiple Vitamins-Minerals (MULTIVITAMIN PO) Take 1 tablet by mouth daily.   Yes [provider]  polyethylene glycol (MIRALAX / GLYCOLAX) packet Take 17 g by mouth daily. 10/24/17  Yes Florencia Reasons, MD  senna-docusate (SENOKOT-S) 8.6-50 MG tablet Take 1 tablet by mouth 2 (two) times daily. 10/23/17  Yes Florencia Reasons, MD  traMADol (ULTRAM) 50 MG tablet Take 50 mg by mouth 3 (three) times daily as needed for pain. 10/16/18  Yes [provider]  vitamin B-12 (CYANOCOBALAMIN) 1000 MCG tablet Take 1,000 mcg by mouth daily.  Yes [provider]  enoxaparin (LOVENOX) 40 MG/0.4ML injection Inject 0.4 mLs (40 mg total) into the skin daily. Patient not taking: Reported on 10/21/2018 10/21/17 10/21/18  Hiram Gash, MD  levothyroxine (SYNTHROID) 50 MCG tablet Take 1 tablet (50 mcg total) by mouth daily. Patient not taking: Reported on 10/21/2018 10/23/17 10/23/18  Florencia Reasons, MD  oxyCODONE (OXY IR/ROXICODONE) 5 MG immediate release tablet Take 1 pills every 4-6 hrs as needed for pain Patient not taking: Reported on 10/21/2018 10/21/17   Hiram Gash, MD    Family History Family History  Problem Relation Age of Onset  . Lung  cancer Sister   . Bone cancer Brother   . Stomach cancer Sister     Social History Social History   Tobacco Use  . Smoking status: Former Smoker    Quit date: 03/27/1983    Years since quitting: 35.8  . Smokeless tobacco: Never Used  Substance Use Topics  . Alcohol use: No  . Drug use: No     Allergies   Patient has no known allergies.   Review of Systems Review of Systems  Respiratory: Negative for shortness of breath.   Cardiovascular: Negative for chest pain.  Musculoskeletal: Positive for arthralgias. Negative for neck pain.  Neurological: Negative for dizziness, syncope, light-headedness and headaches.  All other systems reviewed and are negative.    Physical Exam Updated Vital Signs BP (!) 148/76 (BP Location: Left Arm)   Pulse 76   Temp 99.1 F (37.3 C) (Oral)   Resp 16   SpO2 100%   Physical Exam Vitals signs and nursing note reviewed.  Constitutional:      General: She is not in acute distress.    Appearance: She is well-developed. She is not ill-appearing or diaphoretic.  HENT:     Head: Normocephalic.      Right Ear: External ear normal.     Left Ear: External ear normal.     Nose: Nose normal.  Eyes:     General:        Right eye: No discharge.        Left eye: No discharge.     Extraocular Movements: Extraocular movements intact.  Neck:     Musculoskeletal: Normal range of motion and neck supple. Normal range of motion. No neck rigidity, spinous process tenderness or muscular tenderness.  Cardiovascular:     Rate and Rhythm: Normal rate and regular rhythm.     Pulses:          Radial pulses are 2+ on the left side.     Heart sounds: Normal heart sounds.  Pulmonary:     Effort: Pulmonary effort is normal.     Breath sounds: Normal breath sounds.  Abdominal:     Palpations: Abdomen is soft.     Tenderness: There is no abdominal tenderness.  Musculoskeletal:     Left shoulder: She exhibits decreased range of motion (mild) and  tenderness. She exhibits no deformity.     Left elbow: She exhibits normal range of motion. Tenderness found.     Left upper arm: She exhibits tenderness.       Arms:  Skin:    General: Skin is warm and dry.  Neurological:     Mental Status: She is alert and oriented to person, place, and time.     Comments: CN 3-12 grossly intact. 5/5 strength in all right upper, left upper and right lower extremities. 4/5 strength in Left lower extremity (chronic  from thigh muscle surgery). Grossly normal sensation. Normal finger to nose.   Psychiatric:        Mood and Affect: Mood is not anxious.      ED Treatments / Results  Labs (all labs ordered are listed, but only abnormal results are displayed) Labs Reviewed  BASIC METABOLIC PANEL - Abnormal; Notable for the following components:      Result Value   Glucose, Bld 101 (*)    Creatinine, Ser 1.05 (*)    Calcium 8.8 (*)    GFR calc non Af Amer 49 (*)    GFR calc Af Amer 56 (*)    All other components within normal limits  CBC WITH DIFFERENTIAL/PLATELET    EKG EKG Interpretation  Date/Time:  Friday January 10 2019 21:44:47 EST Ventricular Rate:  67 PR Interval:    QRS Duration: 93 QT Interval:  405 QTC Calculation: 428 R Axis:   20 Text Interpretation: Sinus rhythm Prolonged PR interval Low voltage, extremity and precordial leads no acute ST/T changes Confirmed by Sherwood Gambler [phone]) on 01/10/2019 9:50:29 PM   Radiology Dg Elbow Complete Left  Result Date: 01/10/2019 CLINICAL DATA:  Pain EXAM: LEFT ELBOW - COMPLETE 3+ VIEW COMPARISON:  None. FINDINGS: There is no evidence of fracture, dislocation, or joint effusion. There is no evidence of arthropathy or other focal bone abnormality. Soft tissues are unremarkable. IMPRESSION: Negative. Electronically Signed   By: Constance Holster M.D.   On: 01/10/2019 20:11   Ct Head Wo Contrast  Result Date: 01/10/2019 CLINICAL DATA:  Pain after fall. EXAM: CT HEAD WITHOUT CONTRAST  TECHNIQUE: Contiguous axial images were obtained from the base of the skull through the vertex without intravenous contrast. COMPARISON:  December 10, 2018 FINDINGS: Brain: Cerebellum, brainstem, and basal cisterns are normal. Ventricles and sulci are unremarkable. Moderate to severe white matter changes are noted. No acute cortical ischemia or infarct. No mass effect or midline shift. Vascular: Calcified atherosclerosis in the intracranial carotids. Skull: Normal. Negative for fracture or focal lesion. Sinuses/Orbits: No acute finding. Other: Mild soft tissue swelling over right forehead. Extracranial soft tissues otherwise normal. IMPRESSION: 1. No acute intracranial abnormalities. Moderate white matter changes. Electronically Signed   By: Dorise Bullion III M.D   On: 01/10/2019 20:25   Dg Humerus Left  Result Date: 01/10/2019 CLINICAL DATA:  Fall with left arm injury. EXAM: LEFT HUMERUS - 2+ VIEW COMPARISON:  None. FINDINGS: There is no evidence of fracture or other focal bone lesions. Soft tissues are unremarkable. IMPRESSION: Negative. Electronically Signed   By: Constance Holster M.D.   On: 01/10/2019 20:10    Procedures Procedures (including critical care time)  Medications Ordered in ED Medications  acetaminophen (TYLENOL) tablet 650 mg (650 mg Oral Given 01/10/19 2104)     Initial Impression / Assessment and Plan / ED Course  I have reviewed the triage vital signs and the nursing notes.  Pertinent labs & imaging results that were available during my care of the patient were reviewed by me and considered in my medical decision making (see chart for details).        No significant injuries noted on exam/work-up besides the ecchymosis.  Given the frequent falls, labs and ECG were obtained but these are benign.  She is never having syncopal episodes according to her but more just falling.  Neuro exam unremarkable.  Follow-up with PCP but at this point there does not appear to be an  emergent medical condition.  Final Clinical Impressions(s) /  ED Diagnoses   Final diagnoses:  Frequent falls  Minor head injury, initial encounter  Contusion of left shoulder, initial encounter    ED Discharge Orders    None       Sherwood Gambler, MD 01/10/19 2203

## 2019-01-22 ENCOUNTER — Emergency Department (HOSPITAL_COMMUNITY): Payer: Medicare Other

## 2019-01-22 ENCOUNTER — Emergency Department (HOSPITAL_COMMUNITY)
Admission: EM | Admit: 2019-01-22 | Discharge: 2019-01-22 | Disposition: A | Discharge status: Home / Self Care | Payer: Medicare Other | Attending: Emergency Medicine | Admitting: Emergency Medicine

## 2019-01-22 ENCOUNTER — Other Ambulatory Visit: Payer: Self-pay

## 2019-01-22 ENCOUNTER — Encounter (HOSPITAL_COMMUNITY): Payer: Self-pay

## 2019-01-22 DIAGNOSIS — W0110XA Fall on same level from slipping, tripping and stumbling with subsequent striking against unspecified object, initial encounter: Secondary | ICD-10-CM | POA: Insufficient documentation

## 2019-01-22 DIAGNOSIS — R296 Repeated falls: Secondary | ICD-10-CM | POA: Diagnosis not present

## 2019-01-22 DIAGNOSIS — R0789 Other chest pain: Secondary | ICD-10-CM | POA: Insufficient documentation

## 2019-01-22 DIAGNOSIS — Z87891 Personal history of nicotine dependence: Secondary | ICD-10-CM | POA: Insufficient documentation

## 2019-01-22 DIAGNOSIS — Z79899 Other long term (current) drug therapy: Secondary | ICD-10-CM | POA: Insufficient documentation

## 2019-01-22 DIAGNOSIS — Y939 Activity, unspecified: Secondary | ICD-10-CM | POA: Insufficient documentation

## 2019-01-22 DIAGNOSIS — W19XXXA Unspecified fall, initial encounter: Secondary | ICD-10-CM

## 2019-01-22 DIAGNOSIS — S0083XA Contusion of other part of head, initial encounter: Secondary | ICD-10-CM | POA: Insufficient documentation

## 2019-01-22 DIAGNOSIS — Y999 Unspecified external cause status: Secondary | ICD-10-CM | POA: Insufficient documentation

## 2019-01-22 DIAGNOSIS — E039 Hypothyroidism, unspecified: Secondary | ICD-10-CM | POA: Insufficient documentation

## 2019-01-22 DIAGNOSIS — I10 Essential (primary) hypertension: Secondary | ICD-10-CM | POA: Insufficient documentation

## 2019-01-22 DIAGNOSIS — Y92129 Unspecified place in nursing home as the place of occurrence of the external cause: Secondary | ICD-10-CM | POA: Insufficient documentation

## 2019-01-22 NOTE — ED Provider Notes (Signed)
[facility] DEPT Provider Note   CSN: [id number] Arrival date & time: 01/22/19  1616     History   Chief Complaint Chief Complaint  Patient presents with   Fall    HPI Amber [name] is a 85 y.o. female.     HPI Patient with a history of frequent falls presents after a fall. Patient recalls falling.  She notes that she has had evaluation for her frequent falls in the past, and today's event was not unusual. She notes that she fell, striking her left forehead, right rib cage. It seems as though there is no loss of consciousness. She does not have pain in her neck, chest, abdomen, has no weakness in any extremity Pain is moderate in her left forehead, right rib cage, it is unclear if she is taking any medication for relief, and symptoms are worse with palpation.  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   Thyroid disease     Patient Active Problem List   Diagnosis Date Noted   Proximal Femur Fracture, left /Mechanical Fall 10/19/2017   Femur fracture ([place]) 10/19/2017   Dizziness 01/19/2016   Fall 01/19/2016   Closed right hip fracture ([place]) 01/19/2016   Hypertension 01/19/2016   Hypothyroidism 01/19/2016   Hyperlipidemia 01/19/2016   Hip fracture ([place]) 01/19/2016   Status post laparoscopic cholecystectomy 03/27/2013    Past Surgical History:  Procedure Laterality Date   ABDOMINAL HYSTERECTOMY     CHOLECYSTECTOMY N/A 03/27/2013   Procedure: LAPAROSCOPIC CHOLECYSTECTOMY WITH INTRAOPERATIVE CHOLANGIOGRAM;  Surgeon: Pedro Earls, MD;  Location: [facility];  Service: General;  Laterality: N/A;   HIP ARTHROPLASTY Right 01/20/2016   Procedure: ARTHROPLASTY BIPOLAR HIP (HEMIARTHROPLASTY);  Surgeon: Renette Butters, MD;  Location: [facility];  Service: Orthopedics;  Laterality:  Right;   HIP ARTHROPLASTY Left 10/20/2017   Procedure: ARTHROPLASTY BIPOLAR HIP (HEMIARTHROPLASTY);  Surgeon: Hiram Gash, MD;  Location: [facility];  Service: Orthopedics;  Laterality: Left;   KNEE SURGERY Left    approx 4 surgeries on left knee     OB History   No obstetric history on file.      Home Medications    Prior to Admission medications   Medication Sig Start Date End Date Taking? Authorizing Provider  acetaminophen (TYLENOL) 500 MG tablet Take 1,000 mg by mouth every 8 (eight) hours as needed for mild pain.    [provider]  enoxaparin (LOVENOX) 40 MG/0.4ML injection Inject 0.4 mLs (40 mg total) into the skin daily. Patient not taking: Reported on 10/21/2018 10/21/17 10/21/18  Hiram Gash, MD  famotidine (PEPCID) 20 MG tablet Take 20 mg by mouth 2 (two) times daily.    [provider]  levothyroxine (SYNTHROID) 50 MCG tablet Take 1 tablet (50 mcg total) by mouth daily. Patient not taking: Reported on 10/21/2018 10/23/17 10/23/18  Florencia Reasons, MD  lovastatin (MEVACOR) 20 MG tablet Take 20 mg by mouth at bedtime. 11/17/15   [provider]  Multiple Vitamins-Minerals (MULTIVITAMIN PO) Take 1 tablet by mouth daily.    [provider]  oxyCODONE (OXY IR/ROXICODONE) 5 MG immediate release tablet Take 1 pills every 4-6 hrs as needed for pain Patient not taking: Reported on 10/21/2018 10/21/17   Hiram Gash, MD  polyethylene glycol [facility] /  GLYCOLAX) packet Take 17 g by mouth daily. 10/24/17   Florencia Reasons, MD  senna-docusate (SENOKOT-S) 8.6-50 MG tablet Take 1 tablet by mouth 2 (two) times daily. 10/23/17   Florencia Reasons, MD  traMADol (ULTRAM) 50 MG tablet Take 50 mg by mouth 3 (three) times daily as needed for pain. 10/16/18   [provider]  vitamin B-12 (CYANOCOBALAMIN) 1000 MCG tablet Take 1,000 mcg by mouth daily.    [provider]    Family History Family History  Problem Relation Age of Onset   Lung cancer Sister    Bone cancer Brother     Stomach cancer Sister     Social History Social History   Tobacco Use   Smoking status: Former Smoker    Quit date: 03/27/1983    Years since quitting: 35.8   Smokeless tobacco: Never Used  Substance Use Topics   Alcohol use: No   Drug use: No     Allergies   Patient has no known allergies.   Review of Systems Review of Systems  Constitutional:       Per HPI, otherwise negative  HENT:       Per HPI, otherwise negative  Respiratory:       Per HPI, otherwise negative  Cardiovascular:       Per HPI, otherwise negative  Gastrointestinal: Negative for vomiting.  Endocrine:       Negative aside from HPI  Genitourinary:       Neg aside from HPI   Musculoskeletal:       Per HPI, otherwise negative  Skin: Positive for wound.  Allergic/Immunologic: Negative for immunocompromised state.  Neurological: Positive for weakness and light-headedness. Negative for syncope.     Physical Exam Updated Vital Signs BP (!) 159/71 (BP Location: Right Arm)    Pulse 72    Temp 98.2 F (36.8 C)    Resp 18    SpO2 98%   Physical Exam Vitals signs and nursing note reviewed.  Constitutional:      Appearance: She is well-developed.     Comments: Amber [name] elderly female awake and alert in no distress, stating that she is comfortable.  HENT:     Head: Normocephalic.   Eyes:     Conjunctiva/sclera: Conjunctivae normal.  Neck:     Musculoskeletal: No spinous process tenderness or muscular tenderness.     Thyroid: No thyroid mass.  Cardiovascular:     Rate and Rhythm: Normal rate and regular rhythm.  Pulmonary:     Effort: Pulmonary effort is normal. No respiratory distress.     Breath sounds: Normal breath sounds. No stridor.  Abdominal:     General: There is no distension.  Skin:    General: Skin is warm and dry.  Neurological:     Mental Status: She is alert and oriented to person, place, and time.     Cranial Nerves: No cranial nerve deficit.     Motor: Atrophy  present. No tremor or abnormal muscle tone.     Comments: Global weakness, the patient does move all extremities spontaneously and to command, has 4/5 strength in all extremities, has no pronator drift, nor gross neurologic dysfunction.      ED Treatments / Results  Labs (all labs ordered are listed, but only abnormal results are displayed) Labs Reviewed - No data to display  EKG None  Radiology Dg Ribs Unilateral W/chest Right  Result Date: 01/22/2019 CLINICAL DATA:  Fall.  Right-sided pain EXAM: RIGHT RIBS AND CHEST -  3+ VIEW COMPARISON:  12/10/2018 FINDINGS: No fracture or other bone lesions are seen involving the ribs. There is no evidence of pneumothorax or pleural effusion. Both lungs are clear. Heart size and mediastinal contours are within normal limits. IMPRESSION: Negative. Electronically Signed   By: Franchot Gallo M.D.   On: 01/22/2019 18:09   Ct Head Wo Contrast  Result Date: 01/22/2019 CLINICAL DATA:  [place] while transitioning from chair to recliner, hematoma LEFT temple, frequent falls, hypertension, initial encounter EXAM: CT HEAD WITHOUT CONTRAST TECHNIQUE: Contiguous axial images were obtained from the base of the skull through the vertex without intravenous contrast. Sagittal and coronal MPR images reconstructed from axial data set. COMPARISON:  01/10/2019 FINDINGS: Brain: Generalized atrophy. Normal ventricular morphology. No midline shift or mass effect. Small vessel chronic ischemic changes of deep cerebral white matter. No intracranial hemorrhage, mass lesion, evidence of acute infarction, or extra-axial fluid collection. Vascular: No hyperdense vessels. Atherosclerotic calcification of internal carotid arteries at skull base Skull: Intact Sinuses/Orbits: Clear Other: N/A IMPRESSION: Atrophy with small vessel chronic ischemic changes of deep cerebral white matter. No acute intracranial abnormalities. Electronically Signed   By: Lavonia Dana M.D.   On: 01/22/2019 18:37     Procedures Procedures (including critical care time)  Medications Ordered in ED Medications - No data to display   Initial Impression / Assessment and Plan / ED Course  I have reviewed the triage vital signs and the nursing notes.  Pertinent labs & imaging results that were available during my care of the patient were reviewed by me and considered in my medical decision making (see chart for details).    Chart review notable for multiple prior falls, with several evaluations in the ED over the past few years.    7:03 PM Patient in no distress, awake, alert, speaking clearly. CT head, x-ray unremarkable, no evidence for fracture, intracranial hemorrhage, and given her denial of any neurologic complaints, or any neuro loss of function on exam, patient is appropriate for discharge with outpatient follow-up.  Final Clinical Impressions(s) / ED Diagnoses   Final diagnoses:  Fall, initial encounter    ED Discharge Orders    None       Carmin Muskrat, MD 01/22/19 [phone]

## 2019-01-22 NOTE — Discharge Instructions (Signed)
As discussed, your evaluation today has been largely reassuring.  But, it is important that you monitor your condition carefully, and do not hesitate to return to the ED if you develop new, or concerning changes in your condition. ? ?Otherwise, please follow-up with your physician for appropriate ongoing care. ? ?

## 2019-01-22 NOTE — ED Notes (Signed)
[facility] called for transport.  

## 2019-01-22 NOTE — ED Notes (Signed)
Spoke with daughter about [facility] coming to get pt for transport back to facility.

## 2019-01-22 NOTE — ED Triage Notes (Signed)
Pt BIBA from [facility]. Pt fell transitioning from chair to recliner.  Pt has 1" hematoma on left temple. Pt has frequent falls. No LOC.

## 2019-02-24 ENCOUNTER — Encounter (HOSPITAL_BASED_OUTPATIENT_CLINIC_OR_DEPARTMENT_OTHER): Payer: Self-pay | Admitting: Emergency Medicine

## 2019-02-24 ENCOUNTER — Emergency Department (HOSPITAL_BASED_OUTPATIENT_CLINIC_OR_DEPARTMENT_OTHER)
Admission: EM | Admit: 2019-02-24 | Discharge: 2019-02-25 | Disposition: A | Discharge status: Home / Self Care | Payer: Medicare Other | Attending: Emergency Medicine | Admitting: Emergency Medicine

## 2019-02-24 ENCOUNTER — Other Ambulatory Visit: Payer: Self-pay

## 2019-02-24 DIAGNOSIS — Z79899 Other long term (current) drug therapy: Secondary | ICD-10-CM | POA: Insufficient documentation

## 2019-02-24 DIAGNOSIS — Z0289 Encounter for other administrative examinations: Secondary | ICD-10-CM | POA: Diagnosis present

## 2019-02-24 DIAGNOSIS — U071 COVID-19: Secondary | ICD-10-CM

## 2019-02-24 DIAGNOSIS — Z87891 Personal history of nicotine dependence: Secondary | ICD-10-CM | POA: Insufficient documentation

## 2019-02-24 DIAGNOSIS — E039 Hypothyroidism, unspecified: Secondary | ICD-10-CM | POA: Diagnosis not present

## 2019-02-24 DIAGNOSIS — E785 Hyperlipidemia, unspecified: Secondary | ICD-10-CM | POA: Insufficient documentation

## 2019-02-24 DIAGNOSIS — I10 Essential (primary) hypertension: Secondary | ICD-10-CM | POA: Insufficient documentation

## 2019-02-24 NOTE — ED Notes (Signed)
[facility] [address] called x 2 in attempt to provide report to caregiver. Left call back number on voice mail.

## 2019-02-24 NOTE — ED Notes (Signed)
ED Provider at bedside. 

## 2019-02-24 NOTE — ED Provider Notes (Addendum)
[facility] EMERGENCY DEPARTMENT Provider Note   CSN: [id number] Arrival date & time: 02/24/19  1823     History Chief Complaint  Patient presents with  . Clearance for Skilled Nursing Home    +covid    Amber [name] is a 86 y.o. female.  Patient sent in from [facility] facility tested positive along with several other residents.  Patient was reportedly referred in for medical clearance to go to a nursing facility in [place].  But that facility cannot take them till tomorrow.  Ministrations been involved.  And if patients are stable and in no acute medical distress will be referred back to [facility].  Patient without any specific complaints.  Temp was 99.3 oxygen saturation on room air 100% blood pressure 142/75 heart rate 69 respirations 19.  Patient past medical history is significant for hyperlipidemia and hypertension.  Chart review shows that patient has had several evaluations for falls starting in September 2020.  Last seen at [facility] long emergency department for a fall in January 22, 2019 no admissions during that time.  Patient without any specific complaints.  Patient is from the memory care unit.  The patient will follow commands and is verbal.        Past Medical History:  Diagnosis Date  . Complication of anesthesia    "woke up jumping and bouncing"  . Hyperlipidemia   . Hypertension   . 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 Thyroid disease     Patient Active Problem List   Diagnosis Date Noted  . Proximal Femur Fracture, left [place] Fall 10/19/2017  . Femur fracture ([place]) 10/19/2017  . Dizziness 01/19/2016  . Fall 01/19/2016  . Closed right hip fracture ([place]) 01/19/2016  . Hypertension 01/19/2016  . Hypothyroidism 01/19/2016  . Hyperlipidemia 01/19/2016  . Hip fracture ([place]) 01/19/2016  . Status post laparoscopic cholecystectomy 03/27/2013    Past Surgical History:  Procedure Laterality Date  . ABDOMINAL  HYSTERECTOMY    . CHOLECYSTECTOMY N/A 03/27/2013   Procedure: LAPAROSCOPIC CHOLECYSTECTOMY WITH INTRAOPERATIVE CHOLANGIOGRAM;  Surgeon: Pedro Earls, MD;  [facility];  Service: General;  Laterality: N/A;  . HIP ARTHROPLASTY Right 01/20/2016   Procedure: ARTHROPLASTY BIPOLAR HIP (HEMIARTHROPLASTY);  Surgeon: Renette Butters, MD;  Location: [facility];  Service: Orthopedics;  Laterality: Right;  . HIP ARTHROPLASTY Left 10/20/2017   Procedure: ARTHROPLASTY BIPOLAR HIP (HEMIARTHROPLASTY);  Surgeon: Hiram Gash, MD;  Location: [facility];  Service: Orthopedics;  Laterality: Left;  . KNEE SURGERY Left    approx 4 surgeries on left knee     OB History   No obstetric history on file.     Family History  Problem Relation Age of Onset  . Lung cancer Sister   . Bone cancer Brother   . Stomach cancer Sister     Social History   Tobacco Use  . Smoking status: Former Smoker    Quit date: 03/27/1983    Years since quitting: 35.9  . Smokeless tobacco: Never Used  Substance Use Topics  . Alcohol use: No  . Drug use: No    Home Medications Prior to Admission medications   Medication Sig Start Date End Date Taking? Authorizing Provider  acetaminophen (TYLENOL) 500 MG tablet Take 1,000 mg by mouth every 8 (eight) hours as needed for mild pain.    [provider]  enoxaparin (LOVENOX) 40 MG/0.4ML injection Inject 0.4 mLs (40 mg total) into the skin  daily. Patient not taking: Reported on 10/21/2018 10/21/17 10/21/18  Hiram Gash, MD  famotidine (PEPCID) 20 MG tablet Take 20 mg by mouth 2 (two) times daily.    [provider]  levothyroxine (SYNTHROID) 50 MCG tablet Take 1 tablet (50 mcg total) by mouth daily. Patient not taking: Reported on 10/21/2018 10/23/17 10/23/18  Florencia Reasons, MD  lovastatin (MEVACOR) 20 MG tablet Take 20 mg by mouth at bedtime. 11/17/15   [provider]  Multiple Vitamins-Minerals (MULTIVITAMIN PO) Take 1 tablet by mouth daily.    [provider]    oxyCODONE (OXY IR/ROXICODONE) 5 MG immediate release tablet Take 1 pills every 4-6 hrs as needed for pain Patient not taking: Reported on 10/21/2018 10/21/17   Hiram Gash, MD  polyethylene glycol (MIRALAX / Floria Raveling) packet Take 17 g by mouth daily. 10/24/17   Florencia Reasons, MD  senna-docusate (SENOKOT-S) 8.6-50 MG tablet Take 1 tablet by mouth 2 (two) times daily. 10/23/17   Florencia Reasons, MD  traMADol (ULTRAM) 50 MG tablet Take 50 mg by mouth 3 (three) times daily as needed for pain. 10/16/18   [provider]  vitamin B-12 (CYANOCOBALAMIN) 1000 MCG tablet Take 1,000 mcg by mouth daily.    [provider]    Allergies    Patient has no known allergies.  Review of Systems   Review of Systems  Constitutional: Negative for chills and fever.  HENT: Negative for congestion, rhinorrhea and sore throat.   Eyes: Negative for visual disturbance.  Respiratory: Negative for cough and shortness of breath.   Cardiovascular: Negative for chest pain and leg swelling.  Gastrointestinal: Negative for abdominal pain, diarrhea, nausea and vomiting.  Genitourinary: Negative for dysuria.  Musculoskeletal: Negative for back pain and neck pain.  Skin: Negative for rash.  Neurological: Negative for dizziness, light-headedness and headaches.  Hematological: Does not bruise/bleed easily.  Psychiatric/Behavioral: Negative for confusion.    Physical Exam Updated Vital Signs BP (!) 142/75 (BP Location: Right Arm)   Pulse 69   Temp 99.3 F (37.4 C) (Oral)   Resp 19   SpO2 100%   Physical Exam Vitals and nursing note reviewed.  Constitutional:      General: She is not in acute distress.    Appearance: Normal appearance. She is well-developed.  HENT:     Head: Normocephalic and atraumatic.  Eyes:     Extraocular Movements: Extraocular movements intact.     Conjunctiva/sclera: Conjunctivae normal.     Pupils: Pupils are equal, round, and reactive to light.  Cardiovascular:     Rate and Rhythm:  Normal rate and regular rhythm.     Heart sounds: No murmur.  Pulmonary:     Effort: Pulmonary effort is normal. No respiratory distress.     Breath sounds: Normal breath sounds.  Abdominal:     Palpations: Abdomen is soft.     Tenderness: There is no abdominal tenderness.  Musculoskeletal:        General: Normal range of motion.     Cervical back: Normal range of motion and neck supple.  Skin:    General: Skin is warm and dry.  Neurological:     General: No focal deficit present.     Mental Status: She is alert. Mental status is at baseline.     Cranial Nerves: No cranial nerve deficit.     Sensory: No sensory deficit.     Motor: No weakness.     Comments: No significant focal neuro deficit.  ED Results / Procedures / Treatments   Labs (all labs ordered are listed, but only abnormal results are displayed) Labs Reviewed - No data to display  EKG None  Radiology No results found.  Procedures Procedures (including critical care time)  Medications Ordered in ED Medications - No data to display  ED Course  I have reviewed the triage vital signs and the nursing notes.  Pertinent labs & imaging results that were available during my care of the patient were reviewed by me and considered in my medical decision making (see chart for details).    MDM Rules/Calculators/A&P                      Patient is nontoxic no acute distress no respiratory distress no hypoxia.  Vital signs very stable.  Patient with a positive Covid test according to [place].  Patient clinically stable to return to [facility].  Patient also stable for transfer to another nursing facility as required.    Final Clinical Impression(s) / ED Diagnoses Final diagnoses:  COVID-19 virus infection    Rx / DC Orders ED Discharge Orders    None       Fredia Sorrow, MD 02/24/19 2245    Fredia Sorrow, MD 02/24/19 [id number]    Fredia Sorrow, MD 02/24/19 [phone]

## 2019-02-24 NOTE — Discharge Instructions (Addendum)
From [facility] facility positive Covid test there.  Clinically stable here.  No acute distress no hypoxia.  Stable to discharge back to [facility] or to any other nursing facility.  No respiratory distress.

## 2019-02-24 NOTE — ED Triage Notes (Signed)
Pt here for clearance for skilled nursing home care.  Pt was sent by EMS for evaluation by EDP for orders for skilled care facility.  Pt is covid positive

## 2019-03-04 ENCOUNTER — Other Ambulatory Visit: Payer: Self-pay

## 2019-03-04 ENCOUNTER — Emergency Department (HOSPITAL_COMMUNITY)
Admission: EM | Admit: 2019-03-04 | Discharge: 2019-03-05 | Disposition: A | Discharge status: Home / Self Care | Payer: Medicare Other | Attending: Emergency Medicine | Admitting: Emergency Medicine

## 2019-03-04 ENCOUNTER — Encounter (HOSPITAL_COMMUNITY): Payer: Self-pay | Admitting: Emergency Medicine

## 2019-03-04 ENCOUNTER — Emergency Department (HOSPITAL_COMMUNITY): Payer: Medicare Other

## 2019-03-04 DIAGNOSIS — U071 COVID-19: Secondary | ICD-10-CM | POA: Diagnosis not present

## 2019-03-04 DIAGNOSIS — Z85830 Personal history of malignant neoplasm of bone: Secondary | ICD-10-CM | POA: Diagnosis not present

## 2019-03-04 DIAGNOSIS — Z87891 Personal history of nicotine dependence: Secondary | ICD-10-CM | POA: Insufficient documentation

## 2019-03-04 DIAGNOSIS — Z7982 Long term (current) use of aspirin: Secondary | ICD-10-CM | POA: Insufficient documentation

## 2019-03-04 DIAGNOSIS — E039 Hypothyroidism, unspecified: Secondary | ICD-10-CM | POA: Insufficient documentation

## 2019-03-04 DIAGNOSIS — F0390 Unspecified dementia without behavioral disturbance: Secondary | ICD-10-CM | POA: Diagnosis not present

## 2019-03-04 DIAGNOSIS — I10 Essential (primary) hypertension: Secondary | ICD-10-CM | POA: Insufficient documentation

## 2019-03-04 DIAGNOSIS — Z79899 Other long term (current) drug therapy: Secondary | ICD-10-CM | POA: Diagnosis not present

## 2019-03-04 HISTORY — DX: COVID-19: U07.1

## 2019-03-04 LAB — CBC WITH DIFFERENTIAL/PLATELET
Abs Immature Granulocytes: 0.01 10*3/uL (ref 0.00–0.07)
Basophils Absolute: 0 10*3/uL (ref 0.0–0.1)
Basophils Relative: 0 %
Eosinophils Absolute: 0 10*3/uL (ref 0.0–0.5)
Eosinophils Relative: 1 %
HCT: 40.3 % (ref 36.0–46.0)
Hemoglobin: 13.2 g/dL (ref 12.0–15.0)
Immature Granulocytes: 0 %
Lymphocytes Relative: 23 %
Lymphs Abs: 0.8 10*3/uL (ref 0.7–4.0)
MCH: 30.3 pg (ref 26.0–34.0)
MCHC: 32.8 g/dL (ref 30.0–36.0)
MCV: 92.4 fL (ref 80.0–100.0)
Monocytes Absolute: 0.4 10*3/uL (ref 0.1–1.0)
Monocytes Relative: 11 %
Neutro Abs: 2.2 10*3/uL (ref 1.7–7.7)
Neutrophils Relative %: 65 %
Platelets: 130 10*3/uL — ABNORMAL LOW (ref 150–400)
RBC: 4.36 MIL/uL (ref 3.87–5.11)
RDW: 12.7 % (ref 11.5–15.5)
WBC: 3.4 10*3/uL — ABNORMAL LOW (ref 4.0–10.5)
nRBC: 0 % (ref 0.0–0.2)

## 2019-03-04 LAB — URINALYSIS, ROUTINE W REFLEX MICROSCOPIC
Bilirubin Urine: NEGATIVE
Glucose, UA: NEGATIVE mg/dL
Hgb urine dipstick: NEGATIVE
Ketones, ur: NEGATIVE mg/dL
Leukocytes,Ua: NEGATIVE
Nitrite: NEGATIVE
Protein, ur: NEGATIVE mg/dL
Specific Gravity, Urine: 1.011 (ref 1.005–1.030)
pH: 6 (ref 5.0–8.0)

## 2019-03-04 LAB — COMPREHENSIVE METABOLIC PANEL
ALT: 24 U/L (ref 0–44)
AST: 31 U/L (ref 15–41)
Albumin: 2.9 g/dL — ABNORMAL LOW (ref 3.5–5.0)
Alkaline Phosphatase: 69 U/L (ref 38–126)
Anion gap: 11 (ref 5–15)
BUN: 16 mg/dL (ref 8–23)
CO2: 24 mmol/L (ref 22–32)
Calcium: 7.9 mg/dL — ABNORMAL LOW (ref 8.9–10.3)
Chloride: 101 mmol/L (ref 98–111)
Creatinine, Ser: 0.94 mg/dL (ref 0.44–1.00)
GFR calc Af Amer: >60 mL/min (ref >60)
GFR calc non Af Amer: 56 mL/min — ABNORMAL LOW (ref >60)
Glucose, Bld: 84 mg/dL (ref 70–99)
Potassium: 4.4 mmol/L (ref 3.5–5.1)
Sodium: 136 mmol/L (ref 135–145)
Total Bilirubin: 0.7 mg/dL (ref 0.3–1.2)
Total Protein: 6.8 g/dL (ref 6.5–8.1)

## 2019-03-04 MED ORDER — SODIUM CHLORIDE 0.9 % IV BOLUS
1000.0000 mL | Freq: Once | INTRAVENOUS | Status: AC
  Administered 2019-03-04: 16:00:00 1000 mL via INTRAVENOUS

## 2019-03-04 NOTE — ED Provider Notes (Signed)
[facility] EMERGENCY DEPARTMENT Provider Note   CSN: [id number] Arrival date & time: 03/04/19  1226     History Chief Complaint  Patient presents with  . covid + symptoms  . Fatigue    Amber [name] is a 86 y.o. female.  HPI   Patient presents from in the memory care unit at a local nursing facility due to concern of weakness in the setting of Covid positive infection. The patient herself provides details to her current state, cannot provide longer-term details, ostensibly due to her memory loss. She denies pain, acknowledges generalized weakness, denies nausea, denies dyspnea. Patient was diagnosed positive about 1 week ago, along with several other nursing home residents.  Level 5 caveat secondary to memory loss   Past Medical History:  Diagnosis Date  . Complication of anesthesia    "woke up jumping and bouncing"  . COVID-19   . Hyperlipidemia   . Hypertension   . 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 Thyroid disease     Patient Active Problem List   Diagnosis Date Noted  . Proximal Femur Fracture, left Randel Books Fall 10/19/2017  . Femur fracture ([place]) 10/19/2017  . Dizziness 01/19/2016  . Fall 01/19/2016  . Closed right hip fracture ([place]) 01/19/2016  . Hypertension 01/19/2016  . Hypothyroidism 01/19/2016  . Hyperlipidemia 01/19/2016  . Hip fracture ([place]) 01/19/2016  . Status post laparoscopic cholecystectomy 03/27/2013    Past Surgical History:  Procedure Laterality Date  . ABDOMINAL HYSTERECTOMY    . CHOLECYSTECTOMY N/A 03/27/2013   Procedure: LAPAROSCOPIC CHOLECYSTECTOMY WITH INTRAOPERATIVE CHOLANGIOGRAM;  Surgeon: Pedro Earls, MD;  Location: [facility];  Service: General;  Laterality: N/A;  . HIP ARTHROPLASTY Right 01/20/2016   Procedure: ARTHROPLASTY BIPOLAR HIP (HEMIARTHROPLASTY);  Surgeon: Renette Butters, MD;  Location: [facility];  Service: Orthopedics;  Laterality: Right;  . HIP ARTHROPLASTY Left 10/20/2017   Procedure: ARTHROPLASTY BIPOLAR HIP (HEMIARTHROPLASTY);  Surgeon: Hiram Gash, MD;  Location: [facility];  Service: Orthopedics;  Laterality: Left;  . KNEE SURGERY Left    approx 4 surgeries on left knee     OB History   No obstetric history on file.     Family History  Problem Relation Age of Onset  . Lung cancer Sister   . Bone cancer Brother   . Stomach cancer Sister     Social History   Tobacco Use  . Smoking status: Former Smoker    Quit date: 03/27/1983    Years since quitting: 35.9  . Smokeless tobacco: Never Used  Substance Use Topics  . Alcohol use: No  . Drug use: No    Home Medications Prior to Admission medications   Medication Sig Start Date End Date Taking? Authorizing Provider  acetaminophen (TYLENOL) 500 MG tablet Take 500 mg by mouth every 8 (eight) hours as needed for mild pain.    Yes [provider]  aspirin EC 81 MG tablet Take 81 mg by mouth daily.   Yes [provider]  benazepril (LOTENSIN) 20 MG tablet Take 10 mg by mouth daily.   Yes [provider]  famotidine (PEPCID) 20 MG tablet Take 20 mg by mouth See admin instructions. Take 20 g by mouth one to two times a day as needed for heartburn   Yes [provider]  levothyroxine (SYNTHROID) 50 MCG tablet Take 1 tablet (50 mcg total) by mouth daily. 10/23/17 03/04/19 Yes Florencia Reasons, MD  lovastatin (  MEVACOR) 20 MG tablet Take 20 mg by mouth at bedtime. 11/17/15  Yes [provider]  Multiple Vitamins-Minerals (MULTIVITAMIN PO) Take 1 tablet by mouth daily.   Yes [provider]  Omega-3 Fatty Acids (FISH OIL) 1000 MG CAPS Take 1,000 mg by mouth daily.   Yes [provider]  polyethylene glycol (MIRALAX / GLYCOLAX) packet Take 17 g by mouth daily. Patient taking differently: Take 17 g by mouth every 12 (twelve) hours as needed (for constipation).  10/24/17  Yes Florencia Reasons, MD  senna-docusate (SENOKOT-S) 8.6-50 MG tablet Take 1 tablet by mouth 2 (two)  times daily. 10/23/17  Yes Florencia Reasons, MD  vitamin B-12 (CYANOCOBALAMIN) 1000 MCG tablet Take 1,000 mcg by mouth daily.   Yes [provider]  enoxaparin (LOVENOX) 40 MG/0.4ML injection Inject 0.4 mLs (40 mg total) into the skin daily. Patient not taking: Reported on 03/04/2019 10/21/17 03/04/19  Hiram Gash, MD  oxyCODONE (OXY IR/ROXICODONE) 5 MG immediate release tablet Take 1 pills every 4-6 hrs as needed for pain Patient not taking: Reported on 03/04/2019 10/21/17   Hiram Gash, MD  traMADol (ULTRAM) 50 MG tablet Take 50 mg by mouth 3 (three) times daily as needed for pain. 10/16/18   [provider]    Allergies    Patient has no known allergies.  Review of Systems   Review of Systems  Unable to perform ROS: Dementia  Review of systems as per HPI, though thoroughness is questionable secondary to the patient's memory loss  Physical Exam Updated Vital Signs BP 136/79 (BP Location: Right Arm)   Pulse 75   Temp 98.6 F (37 C) (Oral)   Resp 14   Ht 5\' 7"  (1.702 m)   Wt 67.1 kg   SpO2 97%   BMI 23.17 kg/m   Physical Exam Vitals and nursing note reviewed.  Constitutional:      General: She is not in acute distress.    Appearance: She is well-developed.  HENT:     Head: Normocephalic and atraumatic.  Eyes:     Conjunctiva/sclera: Conjunctivae normal.  Cardiovascular:     Rate and Rhythm: Normal rate and regular rhythm.  Pulmonary:     Effort: Pulmonary effort is normal. No respiratory distress.     Breath sounds: Normal breath sounds. No stridor.  Abdominal:     General: There is no distension.  Skin:    General: Skin is warm and dry.  Neurological:     Mental Status: She is alert.     Cranial Nerves: No cranial nerve deficit.     Motor: Atrophy present.     Comments: Oriented to self, grossly to place, grossly to time  Psychiatric:        Cognition and Memory: Cognition is impaired.     ED Results / Procedures / Treatments   Labs (all labs ordered  are listed, but only abnormal results are displayed) Labs Reviewed  COMPREHENSIVE METABOLIC PANEL - Abnormal; Notable for the following components:      Result Value   Calcium 7.9 (*)    Albumin 2.9 (*)    GFR calc non Af Amer 56 (*)    All other components within normal limits  CBC WITH DIFFERENTIAL/PLATELET - Abnormal; Notable for the following components:   WBC 3.4 (*)    Platelets 130 (*)    All other components within normal limits  URINALYSIS, ROUTINE W REFLEX MICROSCOPIC    EKG EKG Interpretation  Date/Time:  Tuesday  March 04 2019 12:34:02 EST Ventricular Rate:  75 PR Interval:    QRS Duration: 103 QT Interval:  406 QTC Calculation: [id number] R Axis:     Text Interpretation: Sinus rhythm Atrial premature complex Prolonged PR interval Low voltage, extremity and precordial leads ST-t wave abnormality Artifact Abnormal ECG Confirmed by Carmin Muskrat [phone]) on 03/04/2019 12:48:44 PM   Radiology DG Chest Port 1 View  Result Date: 03/04/2019 CLINICAL DATA:  Weakness. EXAM: PORTABLE CHEST 1 VIEW COMPARISON:  January 22, 2019. FINDINGS: The heart size and mediastinal contours are within normal limits. No pneumothorax or pleural effusion is noted. Right lung is clear. Faint lingular opacity is noted laterally which may represent scarring, inflammation or subsegmental atelectasis. The visualized skeletal structures are unremarkable. IMPRESSION: Faint lingular opacity is noted laterally which may represent scarring, inflammation or subsegmental atelectasis. Follow-up radiographs are recommended to ensure resolution. Electronically Signed   By: Marijo Conception M.D.   On: 03/04/2019 12:57    Procedures Procedures (including critical care time)  Medications Ordered in ED Medications  sodium chloride 0.9 % bolus 1,000 mL (1,000 mLs Intravenous New Bag/Given 03/04/19 1625)    ED Course  I have reviewed the triage vital signs and the nursing notes.  Pertinent labs & imaging results  that were available during my care of the patient were reviewed by me and considered in my medical decision making (see chart for details).    MDM Rules/Calculators/A&P                      4:31 PM Patient's initial studies reassuring, no substantial lab abnormalities.  Family reiterates them concern of dehydration, the patient is receiving IV fluid. X-ray consistent with ongoing recovery from Covid infection. Absent hemodynamic instability, distress, evidence for acute new pathology, patient likely appropriate for return to her nursing facility, but additional labs including urinalysis are pending. Dr. Sedonia Small is aware of the patient, her course, will dispel as appropriate. Final Clinical Impression(s) / ED Diagnoses Final diagnoses:  COVID-19      Carmin Muskrat, MD 03/04/19 [phone]

## 2019-03-04 NOTE — ED Notes (Signed)
Please contact Beverlee Nims (daughter) when dispo is set 1 [phone]

## 2019-03-04 NOTE — ED Provider Notes (Signed)
  Provider Note MRN:  [id number]  Arrival date & time: 03/04/19    ED Course and Medical Decision Making  Assumed care from Dr. Vanita Panda at shift change.  Known Covid, not hypoxic, mild dementia, awaiting urinalysis, anticipating discharge back to facility.  6:50 PM update: Work-up is unrevealing, urinalysis normal, patient continues to have normal vital signs, no hypoxia, no increased work of breathing, appropriate for discharge home quarantine at her facility.  Procedures  Final Clinical Impressions(s) / ED Diagnoses     ICD-10-CM   1. COVID-19  U07.1     ED Discharge Orders    None        Discharge Instructions     You were evaluated in the Emergency Department and after careful evaluation, we did not find any emergent condition requiring admission or further testing in the hospital.  Your exam/testing today was overall reassuring.  Please return to the Emergency Department if you experience any worsening of your condition.  We encourage you to follow up with a primary care provider.  Thank you for allowing [place] to be a part of your care.      Barth Kirks. Sedonia Small, [place] [email or web address]    Maudie Flakes, MD 03/04/19 [phone]

## 2019-03-04 NOTE — Discharge Instructions (Addendum)
You were evaluated in the Emergency Department and after careful evaluation, we did not find any emergent condition requiring admission or further testing in the hospital. ° °Your exam/testing today was overall reassuring. ° °Please return to the Emergency Department if you experience any worsening of your condition.  We encourage you to follow up with a primary care provider.  Thank you for allowing us to be a part of your care. ° °

## 2019-03-04 NOTE — ED Triage Notes (Signed)
To ED via [facility] from [facility] on [place]- with staff there stating that pt is more confused and weak than normal- was dx last week and sent to [facility] for evaluation at that time- On arrival, pt is awake, dozes off to sleep easily, does answer questions appropriately. States she feels "weak" and that "they said I needed some fluids"

## 2019-03-08 ENCOUNTER — Emergency Department (HOSPITAL_COMMUNITY): Payer: Medicare Other

## 2019-03-08 ENCOUNTER — Encounter (HOSPITAL_COMMUNITY): Payer: Self-pay

## 2019-03-08 ENCOUNTER — Other Ambulatory Visit: Payer: Self-pay

## 2019-03-08 ENCOUNTER — Emergency Department (HOSPITAL_COMMUNITY)
Admission: EM | Admit: 2019-03-08 | Discharge: 2019-03-08 | Disposition: A | Discharge status: Home / Self Care | Payer: Medicare Other | Attending: Emergency Medicine | Admitting: Emergency Medicine

## 2019-03-08 DIAGNOSIS — Z7982 Long term (current) use of aspirin: Secondary | ICD-10-CM | POA: Insufficient documentation

## 2019-03-08 DIAGNOSIS — E039 Hypothyroidism, unspecified: Secondary | ICD-10-CM | POA: Insufficient documentation

## 2019-03-08 DIAGNOSIS — R4781 Slurred speech: Secondary | ICD-10-CM | POA: Insufficient documentation

## 2019-03-08 DIAGNOSIS — Z8616 Personal history of COVID-19: Secondary | ICD-10-CM | POA: Insufficient documentation

## 2019-03-08 DIAGNOSIS — R509 Fever, unspecified: Secondary | ICD-10-CM | POA: Diagnosis present

## 2019-03-08 DIAGNOSIS — Z87891 Personal history of nicotine dependence: Secondary | ICD-10-CM | POA: Insufficient documentation

## 2019-03-08 DIAGNOSIS — R410 Disorientation, unspecified: Secondary | ICD-10-CM | POA: Diagnosis not present

## 2019-03-08 DIAGNOSIS — Z79899 Other long term (current) drug therapy: Secondary | ICD-10-CM | POA: Insufficient documentation

## 2019-03-08 DIAGNOSIS — J189 Pneumonia, unspecified organism: Secondary | ICD-10-CM

## 2019-03-08 DIAGNOSIS — I10 Essential (primary) hypertension: Secondary | ICD-10-CM | POA: Diagnosis not present

## 2019-03-08 LAB — URINALYSIS, ROUTINE W REFLEX MICROSCOPIC
Bilirubin Urine: NEGATIVE
Glucose, UA: NEGATIVE mg/dL
Hgb urine dipstick: NEGATIVE
Ketones, ur: 5 mg/dL — AB
Leukocytes,Ua: NEGATIVE
Nitrite: NEGATIVE
Protein, ur: NEGATIVE mg/dL
Specific Gravity, Urine: 1.016 (ref 1.005–1.030)
pH: 6 (ref 5.0–8.0)

## 2019-03-08 LAB — CBC WITH DIFFERENTIAL/PLATELET
Abs Immature Granulocytes: 0.02 10*3/uL (ref 0.00–0.07)
Basophils Absolute: 0 10*3/uL (ref 0.0–0.1)
Basophils Relative: 1 %
Eosinophils Absolute: 0 10*3/uL (ref 0.0–0.5)
Eosinophils Relative: 1 %
HCT: 40 % (ref 36.0–46.0)
Hemoglobin: 13.1 g/dL (ref 12.0–15.0)
Immature Granulocytes: 1 %
Lymphocytes Relative: 18 %
Lymphs Abs: 0.7 10*3/uL (ref 0.7–4.0)
MCH: 30.3 pg (ref 26.0–34.0)
MCHC: 32.8 g/dL (ref 30.0–36.0)
MCV: 92.4 fL (ref 80.0–100.0)
Monocytes Absolute: 0.3 10*3/uL (ref 0.1–1.0)
Monocytes Relative: 8 %
Neutro Abs: 2.6 10*3/uL (ref 1.7–7.7)
Neutrophils Relative %: 71 %
Platelets: 217 10*3/uL (ref 150–400)
RBC: 4.33 MIL/uL (ref 3.87–5.11)
RDW: 12.4 % (ref 11.5–15.5)
WBC: 3.6 10*3/uL — ABNORMAL LOW (ref 4.0–10.5)
nRBC: 0 % (ref 0.0–0.2)

## 2019-03-08 LAB — COMPREHENSIVE METABOLIC PANEL
ALT: 22 U/L (ref 0–44)
AST: 23 U/L (ref 15–41)
Albumin: 3.2 g/dL — ABNORMAL LOW (ref 3.5–5.0)
Alkaline Phosphatase: 77 U/L (ref 38–126)
Anion gap: 11 (ref 5–15)
BUN: 18 mg/dL (ref 8–23)
CO2: 26 mmol/L (ref 22–32)
Calcium: 8.4 mg/dL — ABNORMAL LOW (ref 8.9–10.3)
Chloride: 105 mmol/L (ref 98–111)
Creatinine, Ser: 0.83 mg/dL (ref 0.44–1.00)
GFR calc Af Amer: >60 mL/min (ref >60)
GFR calc non Af Amer: >60 mL/min (ref >60)
Glucose, Bld: 98 mg/dL (ref 70–99)
Potassium: 4.2 mmol/L (ref 3.5–5.1)
Sodium: 142 mmol/L (ref 135–145)
Total Bilirubin: 1 mg/dL (ref 0.3–1.2)
Total Protein: 7.5 g/dL (ref 6.5–8.1)

## 2019-03-08 MED ORDER — DOXYCYCLINE HYCLATE 100 MG PO CAPS: 100.0000 mg | ORAL_CAPSULE | Freq: Two times a day (BID) | ORAL | 0 refills | Status: DC

## 2019-03-08 MED ORDER — SODIUM CHLORIDE 0.9 % IV SOLN: INTRAVENOUS | Status: DC

## 2019-03-08 MED ORDER — SODIUM CHLORIDE 0.9 % IV BOLUS
500.0000 mL | Freq: Once | INTRAVENOUS | Status: AC
  Administered 2019-03-08: 500 mL via INTRAVENOUS

## 2019-03-08 NOTE — ED Triage Notes (Signed)
Pt BIBA from [facility] on [place]. Pt dx with COVID on 1/11. Pt has had fever, malaise, fatigue, decreased intake/output. Pt states over the past week there has been a gradual decline.

## 2019-03-08 NOTE — ED Provider Notes (Signed)
[facility] DEPT Provider Note   CSN: [id number] Arrival date & time: 03/08/19  1317     History No chief complaint on file.   Amber [name] is a 86 y.o. female.  86 year old female who was diagnosed with Covid 12 days ago presents from nursing facility due to persistent fever, fatigue, malaise.  Patient has had decreased intake/output.  Possible confusion and slurred speech today.  Patient denies this.  No reported cough or dyspnea.  EMS called and patient's pulse oximetry was 97% on room air.  CBG was above 100.  EMS noted no slurred speech.  Transported here for further management        Past Medical History:  Diagnosis Date  . Complication of anesthesia    "woke up jumping and bouncing"  . COVID-19   . Hyperlipidemia   . Hypertension   . 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 Thyroid disease     Patient Active Problem List   Diagnosis Date Noted  . Proximal Femur Fracture, left Randel Books Fall 10/19/2017  . Femur fracture ([place]) 10/19/2017  . Dizziness 01/19/2016  . Fall 01/19/2016  . Closed right hip fracture ([place]) 01/19/2016  . Hypertension 01/19/2016  . Hypothyroidism 01/19/2016  . Hyperlipidemia 01/19/2016  . Hip fracture ([place]) 01/19/2016  . Status post laparoscopic cholecystectomy 03/27/2013    Past Surgical History:  Procedure Laterality Date  . ABDOMINAL HYSTERECTOMY    . CHOLECYSTECTOMY N/A 03/27/2013   Procedure: LAPAROSCOPIC CHOLECYSTECTOMY WITH INTRAOPERATIVE CHOLANGIOGRAM;  Surgeon: Pedro Earls, MD;  Location[facility];  Service: General;  Laterality: N/A;  . HIP ARTHROPLASTY Right 01/20/2016   Procedure: ARTHROPLASTY BIPOLAR HIP (HEMIARTHROPLASTY);  Surgeon: Renette Butters, MD;  Location: [facility];  Service: Orthopedics;  Laterality: Right;  . HIP ARTHROPLASTY Left 10/20/2017   Procedure: ARTHROPLASTY BIPOLAR HIP (HEMIARTHROPLASTY);  Surgeon: Hiram Gash, MD;  Location: [facility];  Service:  Orthopedics;  Laterality: Left;  . KNEE SURGERY Left    approx 4 surgeries on left knee     OB History   No obstetric history on file.     Family History  Problem Relation Age of Onset  . Lung cancer Sister   . Bone cancer Brother   . Stomach cancer Sister     Social History   Tobacco Use  . Smoking status: Former Smoker    Quit date: 03/27/1983    Years since quitting: 35.9  . Smokeless tobacco: Never Used  Substance Use Topics  . Alcohol use: No  . Drug use: No    Home Medications Prior to Admission medications   Medication Sig Start Date End Date Taking? Authorizing Provider  acetaminophen (TYLENOL) 500 MG tablet Take 500 mg by mouth every 8 (eight) hours as needed for mild pain.     [provider]  aspirin EC 81 MG tablet Take 81 mg by mouth daily.    [provider]  benazepril (LOTENSIN) 20 MG tablet Take 10 mg by mouth daily.    [provider]  enoxaparin (LOVENOX) 40 MG/0.4ML injection Inject 0.4 mLs (40 mg total) into the skin daily. Patient not taking: Reported on 03/04/2019 10/21/17 03/04/19  Hiram Gash, MD  famotidine (PEPCID) 20 MG tablet Take 20 mg by mouth See admin instructions. Take 20 g by mouth one to two times a day as needed for heartburn    [provider]  levothyroxine (SYNTHROID) 50 MCG tablet Take  1 tablet (50 mcg total) by mouth daily. 10/23/17 03/04/19  Florencia Reasons, MD  lovastatin (MEVACOR) 20 MG tablet Take 20 mg by mouth at bedtime. 11/17/15   [provider]  Multiple Vitamins-Minerals (MULTIVITAMIN PO) Take 1 tablet by mouth daily.    [provider]  Omega-3 Fatty Acids (FISH OIL) 1000 MG CAPS Take 1,000 mg by mouth daily.    [provider]  oxyCODONE (OXY IR/ROXICODONE) 5 MG immediate release tablet Take 1 pills every 4-6 hrs as needed for pain Patient not taking: Reported on 03/04/2019 10/21/17   Hiram Gash, MD  polyethylene glycol (MIRALAX / Floria Raveling) packet Take 17 g by mouth  daily. Patient taking differently: Take 17 g by mouth every 12 (twelve) hours as needed (for constipation).  10/24/17   Florencia Reasons, MD  senna-docusate (SENOKOT-S) 8.6-50 MG tablet Take 1 tablet by mouth 2 (two) times daily. 10/23/17   Florencia Reasons, MD  traMADol (ULTRAM) 50 MG tablet Take 50 mg by mouth 3 (three) times daily as needed for pain. 10/16/18   [provider]  vitamin B-12 (CYANOCOBALAMIN) 1000 MCG tablet Take 1,000 mcg by mouth daily.    [provider]    Allergies    Patient has no known allergies.  Review of Systems   Review of Systems  All other systems reviewed and are negative.   Physical Exam Updated Vital Signs BP (!) 143/75   Pulse 66   Temp 98.9 F (37.2 C) (Oral)   Resp 18   SpO2 98%   Physical Exam Vitals and nursing note reviewed.  Constitutional:      General: She is not in acute distress.    Appearance: Normal appearance. She is well-developed. She is not toxic-appearing.  HENT:     Head: Normocephalic and atraumatic.  Eyes:     General: Lids are normal.     Conjunctiva/sclera: Conjunctivae normal.     Pupils: Pupils are equal, round, and reactive to light.  Neck:     Thyroid: No thyroid mass.     Trachea: No tracheal deviation.  Cardiovascular:     Rate and Rhythm: Normal rate and regular rhythm.     Heart sounds: Normal heart sounds. No murmur. No gallop.   Pulmonary:     Effort: Pulmonary effort is normal. No respiratory distress.     Breath sounds: Normal breath sounds. No stridor. No decreased breath sounds, wheezing, rhonchi or rales.  Abdominal:     General: Bowel sounds are normal. There is no distension.     Palpations: Abdomen is soft.     Tenderness: There is no abdominal tenderness. There is no rebound.  Musculoskeletal:        General: No tenderness. Normal range of motion.     Cervical back: Normal range of motion and neck supple.  Skin:    General: Skin is warm and dry.     Findings: No abrasion or rash.    Neurological:     Mental Status: She is alert and oriented to person, place, and time.     GCS: GCS eye subscore is 4. GCS verbal subscore is 5. GCS motor subscore is 6.     Cranial Nerves: No cranial nerve deficit.     Sensory: No sensory deficit.     Motor: No weakness or tremor.     Comments: Strength is 5 of 5 in all 4 extremities  Psychiatric:        Attention and Perception: Attention normal.  Mood and Affect: Affect is flat.        Speech: Speech normal.     ED Results / Procedures / Treatments   Labs (all labs ordered are listed, but only abnormal results are displayed) Labs Reviewed  URINE CULTURE  URINALYSIS, ROUTINE W REFLEX MICROSCOPIC  CBC WITH DIFFERENTIAL/PLATELET  COMPREHENSIVE METABOLIC PANEL    EKG None  Radiology No results found.  Procedures Procedures (including critical care time)  Medications Ordered in ED Medications  0.9 %  sodium chloride infusion (has no administration in time range)  sodium chloride 0.9 % bolus 500 mL (has no administration in time range)    ED Course  I have reviewed the triage vital signs and the nursing notes.  Pertinent labs & imaging results that were available during my care of the patient were reviewed by me and considered in my medical decision making (see chart for details).    MDM Rules/Calculators/A&P                      Patient had a head CT that was without acute findings.  Chest x-ray pneumonia.  Likely from Covid but will place on Doxy in case of bacterial infection.  Urinalysis negative.  She is alert and oriented x4 at this time.  Will discharge home Final Clinical Impression(s) / ED Diagnoses Final diagnoses:  None    Rx / DC Orders ED Discharge Orders    None       Lacretia Leigh, MD 03/08/19 [phone]

## 2019-03-09 LAB — URINE CULTURE: Culture: NO GROWTH

## 2019-10-23 ENCOUNTER — Encounter (HOSPITAL_COMMUNITY): Payer: Self-pay | Admitting: Emergency Medicine

## 2019-10-23 ENCOUNTER — Emergency Department (HOSPITAL_COMMUNITY): Payer: Medicare Other

## 2019-10-23 ENCOUNTER — Emergency Department (HOSPITAL_COMMUNITY)
Admission: EM | Admit: 2019-10-23 | Discharge: 2019-10-23 | Disposition: A | Discharge status: Skilled Nursing Facility | Payer: Medicare Other | Attending: Emergency Medicine | Admitting: Emergency Medicine

## 2019-10-23 DIAGNOSIS — Z87891 Personal history of nicotine dependence: Secondary | ICD-10-CM | POA: Insufficient documentation

## 2019-10-23 DIAGNOSIS — Y998 Other external cause status: Secondary | ICD-10-CM | POA: Diagnosis not present

## 2019-10-23 DIAGNOSIS — W050XXA Fall from non-moving wheelchair, initial encounter: Secondary | ICD-10-CM | POA: Diagnosis not present

## 2019-10-23 DIAGNOSIS — Z96643 Presence of artificial hip joint, bilateral: Secondary | ICD-10-CM | POA: Diagnosis not present

## 2019-10-23 DIAGNOSIS — S8012XA Contusion of left lower leg, initial encounter: Secondary | ICD-10-CM | POA: Insufficient documentation

## 2019-10-23 DIAGNOSIS — Y92002 Bathroom of unspecified non-institutional (private) residence single-family (private) house as the place of occurrence of the external cause: Secondary | ICD-10-CM | POA: Insufficient documentation

## 2019-10-23 DIAGNOSIS — W19XXXA Unspecified fall, initial encounter: Secondary | ICD-10-CM

## 2019-10-23 DIAGNOSIS — Z85831 Personal history of malignant neoplasm of soft tissue: Secondary | ICD-10-CM | POA: Diagnosis not present

## 2019-10-23 DIAGNOSIS — Z7982 Long term (current) use of aspirin: Secondary | ICD-10-CM | POA: Insufficient documentation

## 2019-10-23 DIAGNOSIS — S0990XA Unspecified injury of head, initial encounter: Secondary | ICD-10-CM | POA: Insufficient documentation

## 2019-10-23 DIAGNOSIS — E039 Hypothyroidism, unspecified: Secondary | ICD-10-CM | POA: Diagnosis not present

## 2019-10-23 DIAGNOSIS — Y9389 Activity, other specified: Secondary | ICD-10-CM | POA: Diagnosis not present

## 2019-10-23 DIAGNOSIS — S8992XA Unspecified injury of left lower leg, initial encounter: Secondary | ICD-10-CM | POA: Diagnosis present

## 2019-10-23 DIAGNOSIS — Z79899 Other long term (current) drug therapy: Secondary | ICD-10-CM | POA: Insufficient documentation

## 2019-10-23 DIAGNOSIS — I10 Essential (primary) hypertension: Secondary | ICD-10-CM | POA: Diagnosis not present

## 2019-10-23 NOTE — ED Provider Notes (Signed)
[facility] DEPT Provider Note   CSN: [id number] Arrival date & time: 10/23/19  1715     History Chief Complaint  Patient presents with  . Fall    Amber [name] is a 86 y.o. female.  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  She has a history of frequent falls and fell today.  She was trying to get to the bathroom and fell out of her wheelchair.  This was a witnessed mechanical fall.  Per husband, she fell and hit the back of her head.  She also has some pain to her right ribs.  She complains of some chronic pain in her left leg but says it is a little worsened from the fall and she hurts in her left lower leg and foot.  She is noted to have some swelling of that leg and says that she chronically has swelling.  She is not on blood thinners.  She denies any other injuries.        Past Medical History:  Diagnosis Date  . Complication of anesthesia    "woke up jumping and bouncing"  . COVID-19   . Hyperlipidemia   . Hypertension   . 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 Thyroid disease     Patient Active Problem List   Diagnosis Date Noted  . Proximal Femur Fracture, left [place] Fall 10/19/2017  . Femur fracture ([place]) 10/19/2017  . Dizziness 01/19/2016  . Fall 01/19/2016  . Closed right hip fracture ([place]) 01/19/2016  . Hypertension 01/19/2016  . Hypothyroidism 01/19/2016  . Hyperlipidemia 01/19/2016  . Hip fracture ([place]) 01/19/2016  . Status post laparoscopic cholecystectomy 03/27/2013    Past Surgical History:  Procedure Laterality Date  . ABDOMINAL HYSTERECTOMY    . CHOLECYSTECTOMY N/A 03/27/2013   Procedure: LAPAROSCOPIC CHOLECYSTECTOMY WITH INTRAOPERATIVE CHOLANGIOGRAM;  Surgeon: Pedro Earls, MD;  Location: [facility];  Service: General;  Laterality: N/A;  . HIP ARTHROPLASTY Right  01/20/2016   Procedure: ARTHROPLASTY BIPOLAR HIP (HEMIARTHROPLASTY);  Surgeon: Renette Butters, MD;  Location: [facility];  Service: Orthopedics;  Laterality: Right;  . HIP ARTHROPLASTY Left 10/20/2017   Procedure: ARTHROPLASTY BIPOLAR HIP (HEMIARTHROPLASTY);  Surgeon: Hiram Gash, MD;  Location: [facility];  Service: Orthopedics;  Laterality: Left;  . KNEE SURGERY Left    approx 4 surgeries on left knee     OB History   No obstetric history on file.     Family History  Problem Relation Age of Onset  . Lung cancer Sister   . Bone cancer Brother   . Stomach cancer Sister     Social History   Tobacco Use  . Smoking status: Former Smoker    Quit date: 03/27/1983    Years since quitting: 36.6  . Smokeless tobacco: Never Used  Vaping Use  . Vaping Use: Never used  Substance Use Topics  . Alcohol use: No  . Drug use: No    Home Medications Prior to Admission medications   Medication Sig Start Date End Date Taking? Authorizing Provider  acetaminophen (TYLENOL) 500 MG tablet Take 500 mg by mouth every 8 (eight) hours as needed for mild pain.     [provider]  aspirin EC 81 MG tablet Take 81 mg by mouth daily.    [provider]  benazepril (LOTENSIN) 20  MG tablet Take 10 mg by mouth daily.    [provider]  doxycycline (VIBRAMYCIN) 100 MG capsule Take 1 capsule (100 mg total) by mouth 2 (two) times daily. 03/08/19   Lacretia Leigh, MD  enoxaparin (LOVENOX) 40 MG/0.4ML injection Inject 0.4 mLs (40 mg total) into the skin daily. Patient not taking: Reported on 03/04/2019 10/21/17 03/04/19  Hiram Gash, MD  famotidine (PEPCID) 20 MG tablet Take 20 mg by mouth See admin instructions. Take 20 g by mouth one to two times a day as needed for heartburn    [provider]  levothyroxine (SYNTHROID) 50 MCG tablet Take 1 tablet (50 mcg total) by mouth daily. 10/23/17 03/04/19  Florencia Reasons, MD  lovastatin (MEVACOR) 20 MG tablet Take 20 mg by mouth at bedtime. 11/17/15    [provider]  Multiple Vitamins-Minerals (MULTIVITAMIN PO) Take 1 tablet by mouth daily.    [provider]  Omega-3 Fatty Acids (FISH OIL) 1000 MG CAPS Take 1,000 mg by mouth daily.    [provider]  oxyCODONE (OXY IR/ROXICODONE) 5 MG immediate release tablet Take 1 pills every 4-6 hrs as needed for pain Patient not taking: Reported on 03/04/2019 10/21/17   Hiram Gash, MD  polyethylene glycol (MIRALAX / Floria Raveling) packet Take 17 g by mouth daily. Patient taking differently: Take 17 g by mouth every 12 (twelve) hours as needed (for constipation).  10/24/17   Florencia Reasons, MD  senna-docusate (SENOKOT-S) 8.6-50 MG tablet Take 1 tablet by mouth 2 (two) times daily. 10/23/17   Florencia Reasons, MD  traMADol (ULTRAM) 50 MG tablet Take 50 mg by mouth 3 (three) times daily as needed for pain. 10/16/18   [provider]  vitamin B-12 (CYANOCOBALAMIN) 1000 MCG tablet Take 1,000 mcg by mouth daily.    [provider]    Allergies    Patient has no known allergies.  Review of Systems   Review of Systems  Constitutional: Negative for fever.  Cardiovascular: Positive for chest pain (rib pain).  Gastrointestinal: Negative for nausea and vomiting.  Musculoskeletal: Positive for arthralgias and joint swelling. Negative for back pain and neck pain.  Neurological: Positive for headaches. Negative for weakness and numbness.  All other systems reviewed and are negative.   Physical Exam Updated Vital Signs BP (!) 169/72 (BP Location: Right Arm)   Pulse (!) 58   Temp 98.1 F (36.7 C) (Oral)   Resp 16   SpO2 100%   Physical Exam Constitutional:      Appearance: Normal appearance. She is well-developed.  HENT:     Head: Normocephalic.     Comments: Hematoma to posterior scalp    Nose: Nose normal.  Eyes:     Pupils: Pupils are equal, round, and reactive to light.  Cardiovascular:     Rate and Rhythm: Normal rate and regular rhythm.     Heart sounds: Normal heart  sounds.  Pulmonary:     Effort: Pulmonary effort is normal. No respiratory distress.     Breath sounds: Normal breath sounds. No wheezing or rales.  Chest:     Chest wall: Tenderness (mild tenderness to right posterior ribs, no crepitus or deformity, no signs of external trauma) present.  Abdominal:     General: Bowel sounds are normal.     Palpations: Abdomen is soft.     Tenderness: There is no abdominal tenderness. There is no guarding or rebound.  Musculoskeletal:        General: Normal range  of motion.     Cervical back: Normal range of motion and neck supple.     Comments: Mild swelling to left foot and leg (says is chronic per pt). Mild tenderness to the left fibula and over the foot.  There is no specific area of bony tenderness.  Pedal pulses intact.  No other pain on palpation or ROM of the other extremities  Lymphadenopathy:     Cervical: No cervical adenopathy.  Skin:    General: Skin is warm and dry.     Findings: No rash.  Neurological:     Mental Status: She is alert and oriented to person, place, and time.     Comments: Patient has limited range of motion of her left arm which she says is chronic.  Other extremities have normal motor strength 5 out of 5, sensation grossly intact to light touch all extremities     ED Results / Procedures / Treatments   Labs (all labs ordered are listed, but only abnormal results are displayed) Labs Reviewed - No data to display  EKG None  Radiology DG Ribs Unilateral W/Chest Right  Result Date: 10/23/2019 CLINICAL DATA:  [place], rib pain EXAM: RIGHT RIBS AND CHEST - 3+ VIEW COMPARISON:  03/08/2019 FINDINGS: Frontal and oblique views of the right thoracic cage are obtained. Cardiac silhouette is unremarkable. No airspace disease, effusion, or pneumothorax. No acute displaced fractures. IMPRESSION: 1. No acute intrathoracic process. Electronically Signed   By: Randa Ngo M.D.   On: 10/23/2019 19:23   DG Tibia/Fibula Left  Result  Date: 10/23/2019 CLINICAL DATA:  [place], left leg pain EXAM: LEFT FOOT - COMPLETE 3+ VIEW; LEFT TIBIA AND FIBULA - 2 VIEW COMPARISON:  None. FINDINGS: Left tibia and fibula: Frontal and lateral views demonstrate diffuse osteopenia. No acute displaced fractures. Left knee and ankle are well aligned. Mild osteoarthritis of the left ankle and left knee. Left foot: Frontal, oblique, and lateral views demonstrate no fractures. Alignment is anatomic. Mild osteoarthritis of the first metatarsophalangeal joint and throughout the interphalangeal joints. Soft tissues are unremarkable. IMPRESSION: 1. Osteopenia. 2. No acute displaced fracture. 3. Osteoarthritis of the left knee, ankle, and foot. Electronically Signed   By: Randa Ngo M.D.   On: 10/23/2019 19:26   CT Head Wo Contrast  Result Date: 10/23/2019 CLINICAL DATA:  [place], posterior scalp hematoma EXAM: CT HEAD WITHOUT CONTRAST CT CERVICAL SPINE WITHOUT CONTRAST TECHNIQUE: Multidetector CT imaging of the head and cervical spine was performed following the standard protocol without intravenous contrast. Multiplanar CT image reconstructions of the cervical spine were also generated. COMPARISON:  03/08/2019 FINDINGS: CT HEAD FINDINGS Brain: Stable confluent hypodensities throughout the periventricular and subcortical white matter consistent with chronic small vessel ischemic change. No signs of acute infarct or hemorrhage. Lateral ventricles and midline structures are unremarkable. No acute extra-axial fluid collections. No mass effect. Vascular: No hyperdense vessel or unexpected calcification. Skull: Normal. Negative for fracture or focal lesion. Sinuses/Orbits: No acute finding. Other: None. CT CERVICAL SPINE FINDINGS Alignment: Alignment is anatomic. Skull base and vertebrae: No acute displaced fracture. Soft tissues and spinal canal: No prevertebral fluid or swelling. No visible canal hematoma. Disc levels: Mild multilevel cervical spondylosis greatest at C3-4,  C4-5, and C5-6. No significant compressive sequela. Upper chest: Airway is patent. Extensive emphysematous changes and scarring at the lung apices. Other: Reconstructed images demonstrate no additional findings. IMPRESSION: 1. Stable head CT, no acute process. 2. No acute cervical spine fracture. Electronically Signed   By: Legrand Como  Owens Shark M.D.   On: 10/23/2019 19:32   CT Cervical Spine Wo Contrast  Result Date: 10/23/2019 CLINICAL DATA:  [place], posterior scalp hematoma EXAM: CT HEAD WITHOUT CONTRAST CT CERVICAL SPINE WITHOUT CONTRAST TECHNIQUE: Multidetector CT imaging of the head and cervical spine was performed following the standard protocol without intravenous contrast. Multiplanar CT image reconstructions of the cervical spine were also generated. COMPARISON:  03/08/2019 FINDINGS: CT HEAD FINDINGS Brain: Stable confluent hypodensities throughout the periventricular and subcortical white matter consistent with chronic small vessel ischemic change. No signs of acute infarct or hemorrhage. Lateral ventricles and midline structures are unremarkable. No acute extra-axial fluid collections. No mass effect. Vascular: No hyperdense vessel or unexpected calcification. Skull: Normal. Negative for fracture or focal lesion. Sinuses/Orbits: No acute finding. Other: None. CT CERVICAL SPINE FINDINGS Alignment: Alignment is anatomic. Skull base and vertebrae: No acute displaced fracture. Soft tissues and spinal canal: No prevertebral fluid or swelling. No visible canal hematoma. Disc levels: Mild multilevel cervical spondylosis greatest at C3-4, C4-5, and C5-6. No significant compressive sequela. Upper chest: Airway is patent. Extensive emphysematous changes and scarring at the lung apices. Other: Reconstructed images demonstrate no additional findings. IMPRESSION: 1. Stable head CT, no acute process. 2. No acute cervical spine fracture. Electronically Signed   By: Randa Ngo M.D.   On: 10/23/2019 19:32   DG Foot  Complete Left  Result Date: 10/23/2019 CLINICAL DATA:  [place], left leg pain EXAM: LEFT FOOT - COMPLETE 3+ VIEW; LEFT TIBIA AND FIBULA - 2 VIEW COMPARISON:  None. FINDINGS: Left tibia and fibula: Frontal and lateral views demonstrate diffuse osteopenia. No acute displaced fractures. Left knee and ankle are well aligned. Mild osteoarthritis of the left ankle and left knee. Left foot: Frontal, oblique, and lateral views demonstrate no fractures. Alignment is anatomic. Mild osteoarthritis of the first metatarsophalangeal joint and throughout the interphalangeal joints. Soft tissues are unremarkable. IMPRESSION: 1. Osteopenia. 2. No acute displaced fracture. 3. Osteoarthritis of the left knee, ankle, and foot. Electronically Signed   By: Randa Ngo M.D.   On: 10/23/2019 19:26    Procedures Procedures (including critical care time)  Medications Ordered in ED Medications - No data to display  ED Course  I have reviewed the triage vital signs and the nursing notes.  Pertinent labs & imaging results that were available during my care of the patient were reviewed by me and considered in my medical decision making (see chart for details).    MDM Rules/Calculators/A&P                          Patient is a 86 year old female status post mechanical fall.  She has a hematoma to the posterior scalp.  CT of the head and cervical spine show no acute abnormalities.  No intracranial hemorrhage or cervical spine fracture.  X-rays of the right ribs and left lower leg show no evidence of acute fractures or other bony injuries.  He is reviewed by me.  She was discharged home in good condition.  Return precautions were given. Final Clinical Impression(s) / ED Diagnoses Final diagnoses:  Fall, initial encounter  Injury of head, initial encounter  Contusion of left lower leg, initial encounter    Rx / DC Orders ED Discharge Orders    None       Malvin Johns, MD 10/23/19 2035

## 2019-10-23 NOTE — ED Triage Notes (Signed)
Per EMS-coming from [facility]-mechanical fall while trying to get to bathroom-husband witnessed fall-hematoma to back of head and posterior left rib pain-no LOC, no blood thinners-frequent falls from a muscle removed from her leg years ago

## 2019-10-23 NOTE — ED Notes (Signed)
[facility] called for transported, states there are 2-3 ahead of pt.

## 2019-11-16 ENCOUNTER — Emergency Department (HOSPITAL_COMMUNITY)
Admission: EM | Admit: 2019-11-16 | Discharge: 2019-11-17 | Disposition: A | Discharge status: Skilled Nursing Facility | Payer: Medicare Other | Attending: Emergency Medicine | Admitting: Emergency Medicine

## 2019-11-16 ENCOUNTER — Encounter (HOSPITAL_COMMUNITY): Payer: Self-pay

## 2019-11-16 ENCOUNTER — Other Ambulatory Visit: Payer: Self-pay

## 2019-11-16 DIAGNOSIS — Z8616 Personal history of COVID-19: Secondary | ICD-10-CM | POA: Insufficient documentation

## 2019-11-16 DIAGNOSIS — E039 Hypothyroidism, unspecified: Secondary | ICD-10-CM | POA: Diagnosis not present

## 2019-11-16 DIAGNOSIS — S0990XA Unspecified injury of head, initial encounter: Secondary | ICD-10-CM

## 2019-11-16 DIAGNOSIS — I10 Essential (primary) hypertension: Secondary | ICD-10-CM | POA: Diagnosis not present

## 2019-11-16 DIAGNOSIS — S0101XA Laceration without foreign body of scalp, initial encounter: Secondary | ICD-10-CM | POA: Diagnosis not present

## 2019-11-16 DIAGNOSIS — W19XXXA Unspecified fall, initial encounter: Secondary | ICD-10-CM | POA: Diagnosis not present

## 2019-11-16 DIAGNOSIS — Z87891 Personal history of nicotine dependence: Secondary | ICD-10-CM | POA: Insufficient documentation

## 2019-11-16 DIAGNOSIS — Z7901 Long term (current) use of anticoagulants: Secondary | ICD-10-CM | POA: Insufficient documentation

## 2019-11-16 DIAGNOSIS — Z79899 Other long term (current) drug therapy: Secondary | ICD-10-CM | POA: Diagnosis not present

## 2019-11-16 DIAGNOSIS — Z7982 Long term (current) use of aspirin: Secondary | ICD-10-CM | POA: Insufficient documentation

## 2019-11-16 DIAGNOSIS — Z96643 Presence of artificial hip joint, bilateral: Secondary | ICD-10-CM | POA: Insufficient documentation

## 2019-11-16 NOTE — ED Provider Notes (Signed)
[facility] DEPT Provider Note   CSN: [id number] Arrival date & time: 11/16/19  2222     History Chief Complaint  Patient presents with   Lytle Michaels    TEAGYN FISHEL is a 86 y.o. female.  The history is provided by the patient and medical records.  Fall   86 y.o. F with hx of HTN, HLP, thyroid disease, frequent falls, presenting to the ED after mechanical fall.  EMS reported she rolled out of bed, however patient states she had gotten up to use the bathroom without assistive device and fell.  She struck the left side of her head against nightstand.   Denies LOC.  States she just feels sore all over now.  No recent illness, fever, chills, sweats.  Tetanus UTD.  Past Medical History:  Diagnosis Date   Complication of anesthesia    "woke up jumping and bouncing"   COVID-19    Hyperlipidemia    Hypertension    sarcoma of lt leg dx'd 1989   xrt and chemo and surg   Thyroid disease     Patient Active Problem List   Diagnosis Date Noted   Proximal Femur Fracture, left /Mechanical Fall 10/19/2017   Femur fracture ([place]) 10/19/2017   Dizziness 01/19/2016   Fall 01/19/2016   Closed right hip fracture ([place]) 01/19/2016   Hypertension 01/19/2016   Hypothyroidism 01/19/2016   Hyperlipidemia 01/19/2016   Hip fracture ([place]) 01/19/2016   Status post laparoscopic cholecystectomy 03/27/2013    Past Surgical History:  Procedure Laterality Date   ABDOMINAL HYSTERECTOMY     CHOLECYSTECTOMY N/A 03/27/2013   Procedure: LAPAROSCOPIC CHOLECYSTECTOMY WITH INTRAOPERATIVE CHOLANGIOGRAM;  Surgeon: Pedro Earls, MD;  Location: [facility];  Service: General;  Laterality: N/A;   HIP ARTHROPLASTY Right 01/20/2016   Procedure: ARTHROPLASTY BIPOLAR HIP (HEMIARTHROPLASTY);  Surgeon: Renette Butters, MD;  Location: [facility];  Service: Orthopedics;  Laterality: Right;   HIP ARTHROPLASTY Left 10/20/2017   Procedure: ARTHROPLASTY BIPOLAR HIP (HEMIARTHROPLASTY);   Surgeon: Hiram Gash, MD;  Location: [facility];  Service: Orthopedics;  Laterality: Left;   KNEE SURGERY Left    approx 4 surgeries on left knee     OB History   No obstetric history on file.     Family History  Problem Relation Age of Onset   Lung cancer Sister    Bone cancer Brother    Stomach cancer Sister     Social History   Tobacco Use   Smoking status: Former Smoker    Quit date: 03/27/1983    Years since quitting: 36.6   Smokeless tobacco: Never Used  Vaping Use   Vaping Use: Never used  Substance Use Topics   Alcohol use: No   Drug use: No    Home Medications Prior to Admission medications   Medication Sig Start Date End Date Taking? Authorizing Provider  acetaminophen (TYLENOL) 500 MG tablet Take 500 mg by mouth every 8 (eight) hours as needed for mild pain.     [provider]  aspirin EC 81 MG tablet Take 81 mg by mouth daily.    [provider]  benazepril (LOTENSIN) 20 MG tablet Take 10 mg by mouth daily.    [provider]  doxycycline (VIBRAMYCIN) 100 MG capsule Take 1 capsule (100 mg total) by mouth 2 (two) times daily. 03/08/19   Lacretia Leigh, MD  enoxaparin (LOVENOX) 40 MG/0.4ML injection Inject 0.4 mLs (40 mg total) into the skin daily. Patient not taking: Reported  on 03/04/2019 10/21/17 03/04/19  Hiram Gash, MD  famotidine (PEPCID) 20 MG tablet Take 20 mg by mouth See admin instructions. Take 20 g by mouth one to two times a day as needed for heartburn    [provider]  levothyroxine (SYNTHROID) 50 MCG tablet Take 1 tablet (50 mcg total) by mouth daily. 10/23/17 03/04/19  Florencia Reasons, MD  lovastatin (MEVACOR) 20 MG tablet Take 20 mg by mouth at bedtime. 11/17/15   [provider]  Multiple Vitamins-Minerals (MULTIVITAMIN PO) Take 1 tablet by mouth daily.    [provider]  Omega-3 Fatty Acids (FISH OIL) 1000 MG CAPS Take 1,000 mg by mouth daily.    [provider]  oxyCODONE (OXY  IR/ROXICODONE) 5 MG immediate release tablet Take 1 pills every 4-6 hrs as needed for pain Patient not taking: Reported on 03/04/2019 10/21/17   Hiram Gash, MD  polyethylene glycol (MIRALAX / Floria Raveling) packet Take 17 g by mouth daily. Patient taking differently: Take 17 g by mouth every 12 (twelve) hours as needed (for constipation).  10/24/17   Florencia Reasons, MD  senna-docusate (SENOKOT-S) 8.6-50 MG tablet Take 1 tablet by mouth 2 (two) times daily. 10/23/17   Florencia Reasons, MD  traMADol (ULTRAM) 50 MG tablet Take 50 mg by mouth 3 (three) times daily as needed for pain. 10/16/18   [provider]  vitamin B-12 (CYANOCOBALAMIN) 1000 MCG tablet Take 1,000 mcg by mouth daily.    [provider]    Allergies    Patient has no known allergies.  Review of Systems   Review of Systems  Constitutional:       Head injury  All other systems reviewed and are negative.   Physical Exam Updated Vital Signs BP (!) 145/65 (BP Location: Right Arm)    Pulse 68    Temp 98.1 F (36.7 C) (Oral)    Resp 18    Ht 5' 8.75" (1.746 m)    Wt 66.7 kg    SpO2 97%    BMI 21.87 kg/m   Physical Exam Vitals and nursing note reviewed.  Constitutional:      General: She is not in acute distress.    Appearance: She is well-developed. She is not diaphoretic.  HENT:     Head: Normocephalic and atraumatic.      Comments: Dried blood noted to left parietal scalp with matted hair, 1cm laceration noted to left occipital/parietal scalp as depicted, no bruising around the eyes or behind the ears Eyes:     Conjunctiva/sclera: Conjunctivae normal.     Pupils: Pupils are equal, round, and reactive to light.  Neck:     Comments: c-collar in place, ROM not tested Cardiovascular:     Rate and Rhythm: Normal rate and regular rhythm.     Heart sounds: Normal heart sounds.  Pulmonary:     Effort: Pulmonary effort is normal. No respiratory distress.     Breath sounds: Normal breath sounds. No rhonchi.  Abdominal:      General: Bowel sounds are normal.     Palpations: Abdomen is soft.     Tenderness: There is no abdominal tenderness. There is no rebound.  Musculoskeletal:        General: Normal range of motion.  Skin:    General: Skin is warm and dry.  Neurological:     Mental Status: She is alert and oriented to person, place, and time.     ED Results / Procedures / Treatments  Labs (all labs ordered are listed, but only abnormal results are displayed) Labs Reviewed - No data to display  EKG None  Radiology CT Head Wo Contrast  Result Date: 11/17/2019 CLINICAL DATA:  Fall hitting head EXAM: CT HEAD WITHOUT CONTRAST TECHNIQUE: Contiguous axial images were obtained from the base of the skull through the vertex without intravenous contrast. COMPARISON:  None. FINDINGS: Brain: No evidence of acute territorial infarction, hemorrhage, hydrocephalus,extra-axial collection or mass lesion/mass effect. There is dilatation the ventricles and sulci consistent with age-related atrophy. Low-attenuation changes in the deep white matter consistent with small vessel ischemia. Vascular: No hyperdense vessel or unexpected calcification. Skull: The skull is intact. No fracture or focal lesion identified. Sinuses/Orbits: A small amount of fluid is seen within the right maxillary sinus. The orbits and globes intact. Other: Small soft tissue hematoma seen overlying the left frontal skull. Cervical spine: Alignment: There is straightening of the normal cervical lordosis. Skull base and vertebrae: Visualized skull base is intact. No atlanto-occipital dissociation. The vertebral body heights are well maintained. No fracture or pathologic osseous lesion seen. Soft tissues and spinal canal: The visualized paraspinal soft tissues are unremarkable. No prevertebral soft tissue swelling is seen. The spinal canal is grossly unremarkable, no large epidural collection or significant canal narrowing. Disc levels: Multilevel cervical spine  spondylosis is seen with anterior osteophytes, disc osteophyte complex and uncovertebral osteophytes most notable at C4-C5 with moderate neural foraminal narrowing. Upper chest: Biapical scarring is seen. Thoracic inlet is within normal limits. Other: None IMPRESSION: No acute intracranial abnormality. Findings consistent with age related atrophy and chronic small vessel ischemia Soft tissue hematoma overlying the right frontal skull No acute fracture or malalignment of the spine. Electronically Signed   By: Prudencio Pair M.D.   On: 11/17/2019 00:40   CT Cervical Spine Wo Contrast  Result Date: 11/17/2019 CLINICAL DATA:  Fall hitting head EXAM: CT HEAD WITHOUT CONTRAST TECHNIQUE: Contiguous axial images were obtained from the base of the skull through the vertex without intravenous contrast. COMPARISON:  None. FINDINGS: Brain: No evidence of acute territorial infarction, hemorrhage, hydrocephalus,extra-axial collection or mass lesion/mass effect. There is dilatation the ventricles and sulci consistent with age-related atrophy. Low-attenuation changes in the deep white matter consistent with small vessel ischemia. Vascular: No hyperdense vessel or unexpected calcification. Skull: The skull is intact. No fracture or focal lesion identified. Sinuses/Orbits: A small amount of fluid is seen within the right maxillary sinus. The orbits and globes intact. Other: Small soft tissue hematoma seen overlying the left frontal skull. Cervical spine: Alignment: There is straightening of the normal cervical lordosis. Skull base and vertebrae: Visualized skull base is intact. No atlanto-occipital dissociation. The vertebral body heights are well maintained. No fracture or pathologic osseous lesion seen. Soft tissues and spinal canal: The visualized paraspinal soft tissues are unremarkable. No prevertebral soft tissue swelling is seen. The spinal canal is grossly unremarkable, no large epidural collection or significant canal  narrowing. Disc levels: Multilevel cervical spine spondylosis is seen with anterior osteophytes, disc osteophyte complex and uncovertebral osteophytes most notable at C4-C5 with moderate neural foraminal narrowing. Upper chest: Biapical scarring is seen. Thoracic inlet is within normal limits. Other: None IMPRESSION: No acute intracranial abnormality. Findings consistent with age related atrophy and chronic small vessel ischemia Soft tissue hematoma overlying the right frontal skull No acute fracture or malalignment of the spine. Electronically Signed   By: Prudencio Pair M.D.   On: 11/17/2019 00:40    Procedures Procedures (including critical care time)  LACERATION REPAIR Performed by: Larene Pickett Authorized by: Larene Pickett Consent: Verbal consent obtained. Risks and benefits: risks, benefits and alternatives were discussed Consent given by: patient Patient identity confirmed: provided demographic data Prepped and Draped in normal sterile fashion Wound explored  Laceration Location: left parietal/occipital scalp  Laceration Length: 1cm  No Foreign Bodies seen or palpated  Anesthesia: none  Local anesthetic: none  Anesthetic total: 0 ml  Irrigation method: syringe Amount of cleaning: standard  Skin closure: staple  Number of staple:  1  Technique: n/a  Patient tolerance: Patient tolerated the procedure well with no immediate complications.   Medications Ordered in ED Medications - No data to display  ED Course  I have reviewed the triage vital signs and the nursing notes.  Pertinent labs & imaging results that were available during my care of the patient were reviewed by me and considered in my medical decision making (see chart for details).    MDM Rules/Calculators/A&P  86 year old female here with head injury after mechanical fall.  Try to get out of bed and go to the bathroom but lost her footing and fell striking the left side of her head against the  nightstand.  There was no reported loss of consciousness.  She is not using her assistive device during the fall.  She has a 1 cm laceration to left parietal/occipital scalp, no active bleeding.  Does have a very small hematoma.  Tetanus is up-to-date.  CT head and C-spine negative for any acute findings.  Patient had already removed her own c-collar, ranging neck without difficulty.  Laceration repaired as above, tolerated well.  Discharge home with wound care instructions.  Follow-up with PCP.  Return here for any new or acute changes.  Final Clinical Impression(s) / ED Diagnoses Final diagnoses:  Fall, initial encounter  Injury of head, initial encounter    Rx / DC Orders ED Discharge Orders    None       Larene Pickett, PA-C 11/17/19 0103    Lacretia Leigh, MD 11/19/19 [phone]

## 2019-11-16 NOTE — ED Triage Notes (Signed)
Per EMS, Pt is coming from [facility]. Pt rolled out of bed and hit her head on the corner of the night stand. HX of dementia, alert to her baseline. No blood thinner use, no LOC or neck/back pain. Hematoma of left side with some bleeding.

## 2019-11-16 NOTE — ED Provider Notes (Signed)
Medical screening examination/treatment/procedure(s) were conducted as a shared visit with non-physician practitioner(s) and myself.  I personally evaluated the patient during the encounter.    86 year old female who fell just prior to arrival and struck the left side of her head.  CT scan of the head and C-spine are pending at this time.  Laceration to be repaired once visualized.   Lacretia Leigh, MD 11/16/19 2315

## 2019-11-17 ENCOUNTER — Emergency Department (HOSPITAL_COMMUNITY): Payer: Medicare Other

## 2019-11-17 NOTE — Discharge Instructions (Addendum)
Head and neck CT negative. Small wound closed with 1 staple.  Needs to be removed in about 1 week. Follow-up with primary care to have staple removed and re-check of wound. Return here for new concerns.

## 2019-11-17 NOTE — ED Notes (Signed)
Called [facility], no answer.

## 2019-12-11 ENCOUNTER — Emergency Department (HOSPITAL_COMMUNITY)
Admission: EM | Admit: 2019-12-11 | Discharge: 2019-12-11 | Disposition: A | Discharge status: Home / Self Care | Payer: Medicare Other | Attending: Emergency Medicine | Admitting: Emergency Medicine

## 2019-12-11 ENCOUNTER — Emergency Department (HOSPITAL_COMMUNITY): Payer: Medicare Other

## 2019-12-11 DIAGNOSIS — Z7982 Long term (current) use of aspirin: Secondary | ICD-10-CM | POA: Diagnosis not present

## 2019-12-11 DIAGNOSIS — I10 Essential (primary) hypertension: Secondary | ICD-10-CM | POA: Diagnosis not present

## 2019-12-11 DIAGNOSIS — Y92129 Unspecified place in nursing home as the place of occurrence of the external cause: Secondary | ICD-10-CM | POA: Diagnosis not present

## 2019-12-11 DIAGNOSIS — Z87891 Personal history of nicotine dependence: Secondary | ICD-10-CM | POA: Diagnosis not present

## 2019-12-11 DIAGNOSIS — Z96643 Presence of artificial hip joint, bilateral: Secondary | ICD-10-CM | POA: Insufficient documentation

## 2019-12-11 DIAGNOSIS — S81812A Laceration without foreign body, left lower leg, initial encounter: Secondary | ICD-10-CM | POA: Diagnosis not present

## 2019-12-11 DIAGNOSIS — Z79899 Other long term (current) drug therapy: Secondary | ICD-10-CM | POA: Diagnosis not present

## 2019-12-11 DIAGNOSIS — Z8616 Personal history of COVID-19: Secondary | ICD-10-CM | POA: Insufficient documentation

## 2019-12-11 DIAGNOSIS — S8992XA Unspecified injury of left lower leg, initial encounter: Secondary | ICD-10-CM | POA: Diagnosis present

## 2019-12-11 DIAGNOSIS — S60221A Contusion of right hand, initial encounter: Secondary | ICD-10-CM | POA: Diagnosis not present

## 2019-12-11 DIAGNOSIS — E039 Hypothyroidism, unspecified: Secondary | ICD-10-CM | POA: Insufficient documentation

## 2019-12-11 DIAGNOSIS — W19XXXA Unspecified fall, initial encounter: Secondary | ICD-10-CM | POA: Diagnosis not present

## 2019-12-11 DIAGNOSIS — S0990XA Unspecified injury of head, initial encounter: Secondary | ICD-10-CM | POA: Insufficient documentation

## 2019-12-11 NOTE — ED Provider Notes (Signed)
[facility] DEPT Provider Note   CSN: [id number] Arrival date & time: 12/11/19  0315   History Chief Complaint  Patient presents with  . Fall    Amber [name] is a 86 y.o. female.  The history is provided by the patient and the nursing home.  Fall  She has history of hypertension, hyperlipidemia and comes in following a fall.  She states that she is not sure how she fell because she has so many.  She is reported to have had a bruise to her right hand and foot.  She cannot specify what got injured in her fall.  There was no loss of consciousness.  Past Medical History:  Diagnosis Date  . Complication of anesthesia    "woke up jumping and bouncing"  . COVID-19   . Hyperlipidemia   . Hypertension   . 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 Thyroid disease     Patient Active Problem List   Diagnosis Date Noted  . Proximal Femur Fracture, left [place] [facility] Fall 10/19/2017  . Femur fracture ([place]) 10/19/2017  . Dizziness 01/19/2016  . Fall 01/19/2016  . Closed right hip fracture ([place]) 01/19/2016  . Hypertension 01/19/2016  . Hypothyroidism 01/19/2016  . Hyperlipidemia 01/19/2016  . Hip fracture ([place]) 01/19/2016  . Status post laparoscopic cholecystectomy 03/27/2013    Past Surgical History:  Procedure Laterality Date  . ABDOMINAL HYSTERECTOMY    . CHOLECYSTECTOMY N/A 03/27/2013   Procedure: LAPAROSCOPIC CHOLECYSTECTOMY WITH INTRAOPERATIVE CHOLANGIOGRAM;  Surgeon: Pedro Earls, MD;  Location: [facility];  Service: General;  Laterality: N/A;  . HIP ARTHROPLASTY Right 01/20/2016   Procedure: ARTHROPLASTY BIPOLAR HIP (HEMIARTHROPLASTY);  Surgeon: Renette Butters, MD;  Location: [facility];  Service: Orthopedics;  Laterality: Right;  . HIP ARTHROPLASTY Left 10/20/2017   Procedure: ARTHROPLASTY BIPOLAR HIP (HEMIARTHROPLASTY);  Surgeon: Hiram Gash, MD;  Location: [facility];  Service: Orthopedics;  Laterality: Left;  . KNEE SURGERY Left      approx 4 surgeries on left knee     OB History   No obstetric history on file.     Family History  Problem Relation Age of Onset  . Lung cancer Sister   . Bone cancer Brother   . Stomach cancer Sister     Social History   Tobacco Use  . Smoking status: Former Smoker    Quit date: 03/27/1983    Years since quitting: 36.7  . Smokeless tobacco: Never Used  Vaping Use  . Vaping Use: Never used  Substance Use Topics  . Alcohol use: No  . Drug use: No    Home Medications Prior to Admission medications   Medication Sig Start Date End Date Taking? Authorizing Provider  acetaminophen (TYLENOL) 500 MG tablet Take 500 mg by mouth every 8 (eight) hours as needed for mild pain.     [provider]  aspirin EC 81 MG tablet Take 81 mg by mouth daily.    [provider]  benazepril (LOTENSIN) 20 MG tablet Take 10 mg by mouth daily.    [provider]  doxycycline (VIBRAMYCIN) 100 MG capsule Take 1 capsule (100 mg total) by mouth 2 (two) times daily. 03/08/19   Lacretia Leigh, MD  enoxaparin (LOVENOX) 40 MG/0.4ML injection Inject 0.4 mLs (40 mg total) into the skin daily. Patient not taking: Reported on 03/04/2019 10/21/17 03/04/19  Hiram Gash, MD  famotidine (PEPCID) 20 MG tablet Take 20 mg  by mouth See admin instructions. Take 20 g by mouth one to two times a day as needed for heartburn    [provider]  levothyroxine (SYNTHROID) 50 MCG tablet Take 1 tablet (50 mcg total) by mouth daily. 10/23/17 03/04/19  Florencia Reasons, MD  lovastatin (MEVACOR) 20 MG tablet Take 20 mg by mouth at bedtime. 11/17/15   [provider]  Multiple Vitamins-Minerals (MULTIVITAMIN PO) Take 1 tablet by mouth daily.    [provider]  Omega-3 Fatty Acids (FISH OIL) 1000 MG CAPS Take 1,000 mg by mouth daily.    [provider]  oxyCODONE (OXY IR/ROXICODONE) 5 MG immediate release tablet Take 1 pills every 4-6 hrs as needed for pain Patient not taking:  Reported on 03/04/2019 10/21/17   Hiram Gash, MD  polyethylene glycol (MIRALAX / Floria Raveling) packet Take 17 g by mouth daily. Patient taking differently: Take 17 g by mouth every 12 (twelve) hours as needed (for constipation).  10/24/17   Florencia Reasons, MD  senna-docusate (SENOKOT-S) 8.6-50 MG tablet Take 1 tablet by mouth 2 (two) times daily. 10/23/17   Florencia Reasons, MD  traMADol (ULTRAM) 50 MG tablet Take 50 mg by mouth 3 (three) times daily as needed for pain. 10/16/18   [provider]  vitamin B-12 (CYANOCOBALAMIN) 1000 MCG tablet Take 1,000 mcg by mouth daily.    [provider]    Allergies    Patient has no known allergies.  Review of Systems   Review of Systems  All other systems reviewed and are negative.   Physical Exam Updated Vital Signs BP 130/64 (BP Location: Right Arm)   Pulse 66   Temp 98 F (36.7 C) (Oral)   Resp 18   SpO2 98%   Physical Exam Vitals and nursing note reviewed.   86 year old female, resting comfortably and in no acute distress. Vital signs are normal. Oxygen saturation is 98%, which is normal. Head is normocephalic and atraumatic. PERRLA, EOMI. Oropharynx is clear. Neck is nontender without adenopathy or JVD. Back is nontender and there is no CVA tenderness. Lungs are clear without rales, wheezes, or rhonchi. Chest is nontender. Heart has regular rate and rhythm without murmur. Abdomen is soft, flat, nontender without masses or hepatosplenomegaly and peristalsis is normoactive. Extremities have no cyanosis or edema.  Skin tear noted anterior aspect of left lower leg.  Flexion contracture present left arm. Skin is warm and dry without rash. Neurologic: Awake and alert.  Cranial nerves are intact.  Decreased movement of left arm but normal movement of right arm and both legs.  ED Results / Procedures / Treatments    Radiology CT Head Wo Contrast  Result Date: 12/11/2019 CLINICAL DATA:  Fall with head injury.  Initial encounter. EXAM: CT  HEAD WITHOUT CONTRAST CT CERVICAL SPINE WITHOUT CONTRAST TECHNIQUE: Multidetector CT imaging of the head and cervical spine was performed following the standard protocol without intravenous contrast. Multiplanar CT image reconstructions of the cervical spine were also generated. COMPARISON:  11/17/2019 FINDINGS: CT HEAD FINDINGS Brain: In retrospect there was subarachnoid hemorrhage along the left parietal convexity on prior. This has faded and become more diffused in appearance. No high-density in this area on a 10/23/2019 head CT. No new area of hemorrhage, including subdural hemorrhage. No hydrocephalus. Symmetric cerebral volume loss. Confluent chronic small vessel ischemia in the cerebral white matter. Vascular: No hyperdense vessel or unexpected calcification. Skull: Negative for fracture. Scalp swelling has subsided from before. Sinuses/Orbits: No evidence of injury  CT CERVICAL SPINE FINDINGS Alignment: No traumatic malalignment Skull base and vertebrae: No acute fracture Soft tissues and spinal canal: No prevertebral fluid or swelling. No visible canal hematoma. 13 mm right thyroid nodule. No followup recommended (ref: J Am Coll Radiol. 2015 Feb;12(2): 143-50). Disc levels:  Ordinary degenerative changes Upper chest: No acute finding.  Emphysema. IMPRESSION: 1. In retrospect, small volume subarachnoid hemorrhage at the left parietal convexity on comparison CT 11/17/2019. This has diffused in the interim. No acute hemorrhage. 2. Negative for cervical spine fracture. Electronically Signed   By: Monte Fantasia M.D.   On: 12/11/2019 04:40   CT Cervical Spine Wo Contrast  Result Date: 12/11/2019 CLINICAL DATA:  Fall with head injury.  Initial encounter. EXAM: CT HEAD WITHOUT CONTRAST CT CERVICAL SPINE WITHOUT CONTRAST TECHNIQUE: Multidetector CT imaging of the head and cervical spine was performed following the standard protocol without intravenous contrast. Multiplanar CT image reconstructions of the  cervical spine were also generated. COMPARISON:  11/17/2019 FINDINGS: CT HEAD FINDINGS Brain: In retrospect there was subarachnoid hemorrhage along the left parietal convexity on prior. This has faded and become more diffused in appearance. No high-density in this area on a 10/23/2019 head CT. No new area of hemorrhage, including subdural hemorrhage. No hydrocephalus. Symmetric cerebral volume loss. Confluent chronic small vessel ischemia in the cerebral white matter. Vascular: No hyperdense vessel or unexpected calcification. Skull: Negative for fracture. Scalp swelling has subsided from before. Sinuses/Orbits: No evidence of injury CT CERVICAL SPINE FINDINGS Alignment: No traumatic malalignment Skull base and vertebrae: No acute fracture Soft tissues and spinal canal: No prevertebral fluid or swelling. No visible canal hematoma. 13 mm right thyroid nodule. No followup recommended (ref: J Am Coll Radiol. 2015 Feb;12(2): 143-50). Disc levels:  Ordinary degenerative changes Upper chest: No acute finding.  Emphysema. IMPRESSION: 1. In retrospect, small volume subarachnoid hemorrhage at the left parietal convexity on comparison CT 11/17/2019. This has diffused in the interim. No acute hemorrhage. 2. Negative for cervical spine fracture. Electronically Signed   By: Monte Fantasia M.D.   On: 12/11/2019 04:40    Procedures Procedures   Medications Ordered in ED Medications - No data to display  ED Course  I have reviewed the triage vital signs and the nursing notes.  Pertinent imaging results that were available during my care of the patient were reviewed by me and considered in my medical decision making (see chart for details).  MDM Rules/Calculators/A&P Fall with minor skin tear of left lower leg.  She will be sent for CT of head and cervical spine.  Old records are reviewed confirming several prior ED visits for falls.  CT scan showed no acute injury.  She is discharged back to her skilled nursing  facility.  Final Clinical Impression(s) / ED Diagnoses Final diagnoses:  Fall at nursing home, initial encounter    Rx / DC Orders ED Discharge Orders    None       Delora Fuel, MD 65/79/03 [phone]

## 2019-12-11 NOTE — ED Notes (Signed)
[facility] has been called  

## 2019-12-11 NOTE — ED Notes (Signed)
Patient transported to CT 

## 2019-12-11 NOTE — ED Triage Notes (Signed)
Patient from [facility], hx of falls, hit her head on the bedside table, bruise to the right leg and foot. 150 palp, 84, 16,97% ra [id number]

## 2020-01-12 ENCOUNTER — Encounter (HOSPITAL_COMMUNITY): Payer: Self-pay

## 2020-01-12 ENCOUNTER — Other Ambulatory Visit: Payer: Self-pay

## 2020-01-12 ENCOUNTER — Emergency Department (HOSPITAL_COMMUNITY)
Admission: EM | Admit: 2020-01-12 | Discharge: 2020-01-12 | Disposition: A | Discharge status: Home / Self Care | Payer: Medicare Other | Attending: Emergency Medicine | Admitting: Emergency Medicine

## 2020-01-12 DIAGNOSIS — Z7982 Long term (current) use of aspirin: Secondary | ICD-10-CM | POA: Insufficient documentation

## 2020-01-12 DIAGNOSIS — Z79899 Other long term (current) drug therapy: Secondary | ICD-10-CM | POA: Diagnosis not present

## 2020-01-12 DIAGNOSIS — W06XXXA Fall from bed, initial encounter: Secondary | ICD-10-CM | POA: Insufficient documentation

## 2020-01-12 DIAGNOSIS — Z87891 Personal history of nicotine dependence: Secondary | ICD-10-CM | POA: Insufficient documentation

## 2020-01-12 DIAGNOSIS — S0083XA Contusion of other part of head, initial encounter: Secondary | ICD-10-CM | POA: Diagnosis not present

## 2020-01-12 DIAGNOSIS — S0990XA Unspecified injury of head, initial encounter: Secondary | ICD-10-CM | POA: Diagnosis present

## 2020-01-12 DIAGNOSIS — I10 Essential (primary) hypertension: Secondary | ICD-10-CM | POA: Diagnosis not present

## 2020-01-12 DIAGNOSIS — W19XXXA Unspecified fall, initial encounter: Secondary | ICD-10-CM

## 2020-01-12 NOTE — ED Provider Notes (Signed)
[facility] DEPT Provider Note: Georgena Spurling, MD, FACEP  CSN: [id number] MRN: [id number] ARRIVAL: 01/12/20 at [place]: [place]   HISTORY OF PRESENT ILLNESS  01/12/20 5:29 AM Amber [name] is a 86 y.o. female who fell at her assisted living facility just prior to arrival.  She was trying to move from her bed to her wheelchair and because her left hand does not work well she lost control of the wheelchair, it moved out of the way and she struck her forehead against the nightstand.  She did not lose consciousness.  She has not been vomiting.  She has not on any anticoagulation.  She is having mild pain to her forehead with associated hematoma.  She denies acute pain elsewhere.  She states she always hurts all over, especially her legs.  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   Past Medical History:  Diagnosis Date  . Complication of anesthesia    "woke up jumping and bouncing"  . COVID-19   . Hyperlipidemia   . Hypertension   . 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 Thyroid disease     Past Surgical History:  Procedure Laterality Date  . ABDOMINAL HYSTERECTOMY    . CHOLECYSTECTOMY N/A 03/27/2013   Procedure: LAPAROSCOPIC CHOLECYSTECTOMY WITH INTRAOPERATIVE CHOLANGIOGRAM;  Surgeon: Pedro Earls, MD;  Location: [facility];  Service: General;  Laterality: N/A;  . HIP ARTHROPLASTY Right 01/20/2016   Procedure: ARTHROPLASTY BIPOLAR HIP (HEMIARTHROPLASTY);  Surgeon: Renette Butters, MD;  Location: [facility];  Service: Orthopedics;  Laterality: Right;  . HIP ARTHROPLASTY Left 10/20/2017   Procedure: ARTHROPLASTY BIPOLAR HIP (HEMIARTHROPLASTY);  Surgeon: Hiram Gash, MD;  Location: [place];  Service: Orthopedics;  Laterality: Left;  . KNEE SURGERY Left    approx 4 surgeries on left knee    Family History  Problem Relation Age of Onset  . Lung cancer Sister   . Bone cancer Brother   . Stomach  cancer Sister     Social History   Tobacco Use  . Smoking status: Former Smoker    Quit date: 03/27/1983    Years since quitting: 36.8  . Smokeless tobacco: Never Used  Vaping Use  . Vaping Use: Never used  Substance Use Topics  . Alcohol use: No  . Drug use: No    Prior to Admission medications   Medication Sig Start Date End Date Taking? Authorizing Provider  acetaminophen (TYLENOL) 500 MG tablet Take 500 mg by mouth every 8 (eight) hours as needed for mild pain.     [provider]  aspirin EC 81 MG tablet Take 81 mg by mouth daily.    [provider]  benazepril (LOTENSIN) 20 MG tablet Take 10 mg by mouth daily.    [provider]  famotidine (PEPCID) 20 MG tablet Take 20 mg by mouth See admin instructions. Take 20 g by mouth one to two times a day as needed for heartburn    [provider]  levothyroxine (SYNTHROID) 50 MCG tablet Take 1 tablet (50 mcg total) by mouth daily. 10/23/17 03/04/19  Florencia Reasons, MD  lovastatin (MEVACOR) 20 MG tablet Take 20 mg by mouth at bedtime. 11/17/15   [provider]  Multiple Vitamins-Minerals (MULTIVITAMIN PO) Take 1 tablet by mouth daily.    [provider]  Omega-3 Fatty Acids (FISH OIL) 1000 MG CAPS Take  1,000 mg by mouth daily.    [provider]  polyethylene glycol (MIRALAX / GLYCOLAX) packet Take 17 g by mouth daily. Patient taking differently: Take 17 g by mouth every 12 (twelve) hours as needed (for constipation).  10/24/17   Florencia Reasons, MD  senna-docusate (SENOKOT-S) 8.6-50 MG tablet Take 1 tablet by mouth 2 (two) times daily. 10/23/17   Florencia Reasons, MD  vitamin B-12 (CYANOCOBALAMIN) 1000 MCG tablet Take 1,000 mcg by mouth daily.    [provider]  enoxaparin (LOVENOX) 40 MG/0.4ML injection Inject 0.4 mLs (40 mg total) into the skin daily. Patient not taking: Reported on 03/04/2019 10/21/17 12/11/19  Hiram Gash, MD    Allergies Patient has no known allergies.   REVIEW  OF SYSTEMS  Negative except as noted here or in the History of Present Illness.   PHYSICAL EXAMINATION  Initial Vital Signs Blood pressure 140/84, pulse 63, temperature 98 F (36.7 C), temperature source Oral, resp. rate 16, SpO2 100 %.  Examination General: Well-developed, well-nourished female in no acute distress; appearance consistent with age of record HENT: normocephalic; no hemotympanum; forehead hematoma without bony deformity:    Eyes: pupils equal, round and reactive to light; extraocular muscles intact Neck: supple; nontender Heart: regular rate and rhythm Lungs: clear to auscultation bilaterally Abdomen: soft; nondistended; nontender; bowel sounds present Extremities: Muscular deformity of left thigh; arthritic changes; pulses normal; trace edema of lower legs, left greater than right Neurologic: Awake, alert and oriented; motor function intact in all extremities and symmetric; no facial droop; generalized slowness of movement and speech Skin: Warm and dry Psychiatric: Normal mood and affect   RESULTS  Summary of this visit's results, reviewed and interpreted by myself:   EKG Interpretation  Date/Time:    Ventricular Rate:    PR Interval:    QRS Duration:   QT Interval:    QTC Calculation:   R Axis:     Text Interpretation:        Laboratory Studies: No results found for this or any previous visit (from the past 24 hour(s)). Imaging Studies: No results found.  ED COURSE and MDM  Nursing notes, initial and subsequent vitals signs, including pulse oximetry, reviewed and interpreted by myself.  Vitals:   01/12/20 0536  BP: 140/84  Pulse: 63  Resp: 16  Temp: 98 F (36.7 C)  TempSrc: Oral  SpO2: 100%   Medications - No data to display  The patient is awake, alert and oriented.  She is not on any anticoagulation.  She has not been vomiting.  The hematoma appears superficial and not believe a CT scan is indicated at this time.  PROCEDURES    Procedures   ED DIAGNOSES     ICD-10-CM   1. Fall at nursing home, initial encounter  W19.XXXA    Y92.129   2. Traumatic hematoma of forehead, initial encounter  Q22.97LG        Shanon Rosser, MD 01/12/20 [phone]

## 2020-01-12 NOTE — ED Notes (Signed)
[facility] called for transportation back to [facility].

## 2020-01-12 NOTE — ED Triage Notes (Signed)
Pt arrived via [facility] from [facility] on [address]. Pt fell and hit her head on the nightstand. Pt is A&Ox4 per EMS.   Vitals from EMS BP: 134/80 HR: 74 SpO2: 98% RA

## 2020-07-05 ENCOUNTER — Emergency Department (HOSPITAL_COMMUNITY): Payer: Medicare Other

## 2020-07-05 ENCOUNTER — Encounter (HOSPITAL_COMMUNITY): Payer: Self-pay

## 2020-07-05 ENCOUNTER — Emergency Department (HOSPITAL_COMMUNITY)
Admission: EM | Admit: 2020-07-05 | Discharge: 2020-07-06 | Disposition: A | Discharge status: Home / Self Care | Payer: Medicare Other | Attending: Emergency Medicine | Admitting: Emergency Medicine

## 2020-07-05 ENCOUNTER — Other Ambulatory Visit: Payer: Self-pay

## 2020-07-05 DIAGNOSIS — Z8616 Personal history of COVID-19: Secondary | ICD-10-CM | POA: Insufficient documentation

## 2020-07-05 DIAGNOSIS — U071 COVID-19: Secondary | ICD-10-CM | POA: Diagnosis not present

## 2020-07-05 DIAGNOSIS — Z79899 Other long term (current) drug therapy: Secondary | ICD-10-CM | POA: Diagnosis not present

## 2020-07-05 DIAGNOSIS — Z87891 Personal history of nicotine dependence: Secondary | ICD-10-CM | POA: Insufficient documentation

## 2020-07-05 DIAGNOSIS — Y92129 Unspecified place in nursing home as the place of occurrence of the external cause: Secondary | ICD-10-CM | POA: Diagnosis not present

## 2020-07-05 DIAGNOSIS — R531 Weakness: Secondary | ICD-10-CM | POA: Diagnosis present

## 2020-07-05 DIAGNOSIS — Z96643 Presence of artificial hip joint, bilateral: Secondary | ICD-10-CM | POA: Insufficient documentation

## 2020-07-05 DIAGNOSIS — Z7982 Long term (current) use of aspirin: Secondary | ICD-10-CM | POA: Diagnosis not present

## 2020-07-05 DIAGNOSIS — E039 Hypothyroidism, unspecified: Secondary | ICD-10-CM | POA: Diagnosis not present

## 2020-07-05 DIAGNOSIS — I10 Essential (primary) hypertension: Secondary | ICD-10-CM | POA: Diagnosis not present

## 2020-07-05 DIAGNOSIS — S0083XA Contusion of other part of head, initial encounter: Secondary | ICD-10-CM | POA: Diagnosis not present

## 2020-07-05 DIAGNOSIS — W050XXA Fall from non-moving wheelchair, initial encounter: Secondary | ICD-10-CM | POA: Diagnosis not present

## 2020-07-05 LAB — CBC WITH DIFFERENTIAL/PLATELET
Abs Immature Granulocytes: 0.02 10*3/uL (ref 0.00–0.07)
Basophils Absolute: 0 10*3/uL (ref 0.0–0.1)
Basophils Relative: 1 %
Eosinophils Absolute: 0 10*3/uL (ref 0.0–0.5)
Eosinophils Relative: 0 %
HCT: 33.5 % — ABNORMAL LOW (ref 36.0–46.0)
Hemoglobin: 10.7 g/dL — ABNORMAL LOW (ref 12.0–15.0)
Immature Granulocytes: 0 %
Lymphocytes Relative: 7 %
Lymphs Abs: 0.4 10*3/uL — ABNORMAL LOW (ref 0.7–4.0)
MCH: 28.8 pg (ref 26.0–34.0)
MCHC: 31.9 g/dL (ref 30.0–36.0)
MCV: 90.1 fL (ref 80.0–100.0)
Monocytes Absolute: 0.7 10*3/uL (ref 0.1–1.0)
Monocytes Relative: 11 %
Neutro Abs: 4.7 10*3/uL (ref 1.7–7.7)
Neutrophils Relative %: 81 %
Platelets: 172 10*3/uL (ref 150–400)
RBC: 3.72 MIL/uL — ABNORMAL LOW (ref 3.87–5.11)
RDW: 13 % (ref 11.5–15.5)
WBC: 5.8 10*3/uL (ref 4.0–10.5)
nRBC: 0 % (ref 0.0–0.2)

## 2020-07-05 LAB — URINALYSIS, ROUTINE W REFLEX MICROSCOPIC
Bilirubin Urine: NEGATIVE
Glucose, UA: NEGATIVE mg/dL
Hgb urine dipstick: NEGATIVE
Ketones, ur: NEGATIVE mg/dL
Leukocytes,Ua: NEGATIVE
Nitrite: NEGATIVE
Protein, ur: NEGATIVE mg/dL
Specific Gravity, Urine: 1.008 (ref 1.005–1.030)
pH: 5 (ref 5.0–8.0)

## 2020-07-05 LAB — RESP PANEL BY RT-PCR (FLU A&B, COVID) ARPGX2
Influenza A by PCR: NEGATIVE
Influenza B by PCR: NEGATIVE
SARS Coronavirus 2 by RT PCR: POSITIVE — AB

## 2020-07-05 LAB — COMPREHENSIVE METABOLIC PANEL
ALT: 25 U/L (ref 0–44)
AST: 30 U/L (ref 15–41)
Albumin: 2.9 g/dL — ABNORMAL LOW (ref 3.5–5.0)
Alkaline Phosphatase: 71 U/L (ref 38–126)
Anion gap: 9 (ref 5–15)
BUN: 16 mg/dL (ref 8–23)
CO2: 22 mmol/L (ref 22–32)
Calcium: 8.3 mg/dL — ABNORMAL LOW (ref 8.9–10.3)
Chloride: 105 mmol/L (ref 98–111)
Creatinine, Ser: 1.06 mg/dL — ABNORMAL HIGH (ref 0.44–1.00)
GFR, Estimated: 51 mL/min — ABNORMAL LOW (ref >60)
Glucose, Bld: 108 mg/dL — ABNORMAL HIGH (ref 70–99)
Potassium: 3.5 mmol/L (ref 3.5–5.1)
Sodium: 136 mmol/L (ref 135–145)
Total Bilirubin: 1 mg/dL (ref 0.3–1.2)
Total Protein: 7 g/dL (ref 6.5–8.1)

## 2020-07-05 MED ORDER — SODIUM CHLORIDE 0.9 % IV BOLUS
1000.0000 mL | Freq: Once | INTRAVENOUS | Status: AC
  Administered 2020-07-05: 1000 mL via INTRAVENOUS

## 2020-07-05 NOTE — ED Provider Notes (Signed)
[facility] EMERGENCY DEPARTMENT Provider Note   CSN: [id number] Arrival date & time: 07/05/20  3086     History Chief Complaint  Patient presents with  . Weakness    Amber [name] is a 87 y.o. female with a history of hypertension,   Patient presents with a chief complaint of generalized weakness and falls.  Patient suffered 2 falls at skilled nursing facility this weekend.  Patient was not sent out for medical examination after these falls.  Patient reports that she remembers falling but cannot remember the mechanism of injury or if she had any loss of consciousness.  Patient denies any injuries from these falls.  Patient endorses a contusion to frontal aspect of head.  Per patient's daughter patient suffered a fall on Friday and Saturday.  Patient is not on any blood thinners.  Per EMS facility called because patient was "slumped," over in her wheelchair.  Nurse reports that patient was rotated in her wheelchair and after repositioning patient was noted to have no slumping.  Patient denies any numbness to extremities, facial asymmetry, or slurred speech.  Speaking with patient's daughter she states that speech is slow at baseline however may have been slightly slower when speaking on the phone this morning.  Patient and patient's daughter report baseline weakness to left lower extremity due to history of sarcoma and surgical fixation.  Patient also reports weakness and difficulty moving left forearm and wrist due to previous fracture to left elbow.  Patient endorses productive cough.  Patient reports cough started on Friday.  Patient denies any fevers, chills, shortness of breath, chest pain.  Patient endorses rhinorrhea and nasal congestion however reports this has been present for "a while."  Patient denies any abdominal pain, nausea, vomiting, diarrhea, dysuria, urinary frequency, hematuria, headache, neck pain, back pain, difficulty urinating, lightheadedness,  dizziness, confusion.    HPI     Past Medical History:  Diagnosis Date  . Complication of anesthesia    "woke up jumping and bouncing"  . COVID-19   . Hyperlipidemia   . Hypertension   . 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 Thyroid disease     Patient Active Problem List   Diagnosis Date Noted  . Proximal Femur Fracture, left [place] Fall 10/19/2017  . Femur fracture ([place]) 10/19/2017  . Dizziness 01/19/2016  . Fall 01/19/2016  . Closed right hip fracture ([place]) 01/19/2016  . Hypertension 01/19/2016  . Hypothyroidism 01/19/2016  . Hyperlipidemia 01/19/2016  . Hip fracture ([place]) 01/19/2016  . Status post laparoscopic cholecystectomy 03/27/2013    Past Surgical History:  Procedure Laterality Date  . ABDOMINAL HYSTERECTOMY    . CHOLECYSTECTOMY N/A 03/27/2013   Procedure: LAPAROSCOPIC CHOLECYSTECTOMY WITH INTRAOPERATIVE CHOLANGIOGRAM;  Surgeon: Pedro Earls, MD;  Location[facility];  Service: General;  Laterality: N/A;  . HIP ARTHROPLASTY Right 01/20/2016   Procedure: ARTHROPLASTY BIPOLAR HIP (HEMIARTHROPLASTY);  Surgeon: Renette Butters, MD;  Location: [facility];  Service: Orthopedics;  Laterality: Right;  . HIP ARTHROPLASTY Left 10/20/2017   Procedure: ARTHROPLASTY BIPOLAR HIP (HEMIARTHROPLASTY);  Surgeon: Hiram Gash, MD;  Location: [facility];  Service: Orthopedics;  Laterality: Left;  . KNEE SURGERY Left    approx 4 surgeries on left knee     OB History   No obstetric history on file.     Family History  Problem Relation Age of Onset  . Lung cancer Sister   . Bone cancer Brother   .  Stomach cancer Sister     Social History   Tobacco Use  . Smoking status: Former Smoker    Quit date: 03/27/1983    Years since quitting: 37.3  . Smokeless tobacco: Never Used  Vaping Use  . Vaping Use: Never used  Substance Use Topics  . Alcohol use: No  . Drug use: No    Home Medications Prior to Admission medications   Medication Sig Start Date End  Date Taking? Authorizing Provider  acetaminophen (TYLENOL) 500 MG tablet Take 500 mg by mouth every 8 (eight) hours as needed for mild pain.     [provider]  aspirin EC 81 MG tablet Take 81 mg by mouth daily.    [provider]  benazepril (LOTENSIN) 20 MG tablet Take 10 mg by mouth daily.    [provider]  famotidine (PEPCID) 20 MG tablet Take 20 mg by mouth See admin instructions. Take 20 g by mouth one to two times a day as needed for heartburn    [provider]  levothyroxine (SYNTHROID) 50 MCG tablet Take 1 tablet (50 mcg total) by mouth daily. 10/23/17 03/04/19  Florencia Reasons, MD  lovastatin (MEVACOR) 20 MG tablet Take 20 mg by mouth at bedtime. 11/17/15   [provider]  Multiple Vitamins-Minerals (MULTIVITAMIN PO) Take 1 tablet by mouth daily.    [provider]  Omega-3 Fatty Acids (FISH OIL) 1000 MG CAPS Take 1,000 mg by mouth daily.    [provider]  polyethylene glycol (MIRALAX / GLYCOLAX) packet Take 17 g by mouth daily. Patient taking differently: Take 17 g by mouth every 12 (twelve) hours as needed (for constipation).  10/24/17   Florencia Reasons, MD  senna-docusate (SENOKOT-S) 8.6-50 MG tablet Take 1 tablet by mouth 2 (two) times daily. 10/23/17   Florencia Reasons, MD  vitamin B-12 (CYANOCOBALAMIN) 1000 MCG tablet Take 1,000 mcg by mouth daily.    [provider]  enoxaparin (LOVENOX) 40 MG/0.4ML injection Inject 0.4 mLs (40 mg total) into the skin daily. Patient not taking: Reported on 03/04/2019 10/21/17 12/11/19  Hiram Gash, MD    Allergies    Patient has no known allergies.  Review of Systems   Review of Systems  Constitutional: Negative for chills and fever.  HENT: Positive for congestion and rhinorrhea. Negative for sore throat.   Eyes: Negative for visual disturbance.  Respiratory: Positive for cough. Negative for shortness of breath.   Cardiovascular: Negative for chest pain.  Gastrointestinal: Negative for  abdominal pain, diarrhea, nausea and vomiting.  Genitourinary: Negative for difficulty urinating, dysuria, frequency and hematuria.  Musculoskeletal: Positive for gait problem (baseline d/t left leg weakness). Negative for arthralgias, back pain, joint swelling, neck pain and neck stiffness.  Skin: Negative for color change and rash.  Neurological: Positive for weakness (baseline weakness to LLE, left forearm and left wrist). Negative for dizziness, tremors, seizures, syncope, facial asymmetry, speech difficulty, light-headedness, numbness and headaches.  Psychiatric/Behavioral: Negative for confusion.    Physical Exam Updated Vital Signs BP (!) 118/54   Pulse 75   Temp 98 F (36.7 C)   Resp 19   Ht 5\' 8"  (1.727 m)   Wt 66.7 kg   SpO2 97%   BMI 22.36 kg/m   Physical Exam Vitals and nursing note reviewed.  Constitutional:      General: She is not in acute distress.    Appearance: She is not ill-appearing, toxic-appearing or diaphoretic.     Interventions: Cervical collar in  place.  HENT:     Head: Normocephalic. Contusion present. No raccoon eyes, Battle's sign, abrasion, masses, right periorbital erythema, left periorbital erythema or laceration.     Jaw: No trismus or pain on movement.      Comments: Contusion to frontal aspect of head    Mouth/Throat:     Pharynx: Oropharynx is clear. Uvula midline. No pharyngeal swelling, oropharyngeal exudate, posterior oropharyngeal erythema or uvula swelling.  Eyes:     General: No scleral icterus.       Right eye: No discharge.        Left eye: No discharge.     Extraocular Movements: Extraocular movements intact.     Conjunctiva/sclera:     Right eye: Right conjunctiva is not injected. No chemosis, exudate or hemorrhage.    Left eye: Left conjunctiva is not injected. No chemosis, exudate or hemorrhage.    Pupils: Pupils are equal, round, and reactive to light.  Cardiovascular:     Rate and Rhythm: Normal rate.     Heart sounds:  Normal heart sounds.  Pulmonary:     Effort: Pulmonary effort is normal. No tachypnea, bradypnea or respiratory distress.     Breath sounds: Normal breath sounds.     Comments: Patient able speak in full pretenses without difficulty.  Lungs clear to auscultation bilaterally. Chest:     Chest wall: No mass, lacerations, deformity, swelling, tenderness or crepitus.  Abdominal:     General: There is no distension. There are no signs of injury.     Palpations: Abdomen is soft. There is no mass or pulsatile mass.     Tenderness: There is no abdominal tenderness. There is no guarding or rebound.  Musculoskeletal:     Cervical back: Normal range of motion and neck supple. No rigidity.     Comments: Patient has healing hematoma.  Upper left thoracic and midline thoracic back  No deformity or midline tenderness to thoracic or lumbar spine  Patient has decreased range of motion to left elbows, unable to pronate and supinate left upper extremity.  Patient able to left shoulder that difficulty.  Deformity, tenderness, or bony tenderness to bilateral upper or lower extremities.  Patient has full passive range of motion without complaints of pain to right upper and bilateral lower extremities.  Decreased range of motion to left upper extremity as noted above.  Skin:    General: Skin is warm and dry.     Coloration: Skin is not jaundiced or pale.  Neurological:     General: No focal deficit present.     Mental Status: She is alert and oriented to person, place, and time.     GCS: GCS eye subscore is 4. GCS verbal subscore is 5. GCS motor subscore is 6.     Cranial Nerves: No cranial nerve deficit or facial asymmetry.     Sensory: Sensation is intact.     Motor: Weakness present. No tremor or seizure activity.     Coordination: Finger-Nose-Finger Test normal.     Comments: CN II-XII intact; performed in supine position, grip strength equal.  +5 dorsiflexion plantarflexion bilaterally.  +5 strength  to right upper extremity.  Patient has decreased strength to left lower extremity which is baseline.  Patient has decree strength in left upper extremity which is baseline.  Patient has no pronator drift to right upper extremity, unable to perform with left upper extremity due to decreased range of motion.  Sensation to light touch intact to bilateral upper and  lower extremities  Patient has no slurred speech however which is noted to be slow.  Patient answers all questions appropriately and is alert to person, place, and time.  Psychiatric:        Behavior: Behavior is cooperative.     ED Results / Procedures / Treatments   Labs (all labs ordered are listed, but only abnormal results are displayed) Labs Reviewed  RESP PANEL BY RT-PCR (FLU A&B, COVID) ARPGX2 - Abnormal; Notable for the following components:      Result Value   SARS Coronavirus 2 by RT PCR POSITIVE (*)    All other components within normal limits  COMPREHENSIVE METABOLIC PANEL - Abnormal; Notable for the following components:   Glucose, Bld 108 (*)    Creatinine, Ser 1.06 (*)    Calcium 8.3 (*)    Albumin 2.9 (*)    GFR, Estimated 51 (*)    All other components within normal limits  CBC WITH DIFFERENTIAL/PLATELET - Abnormal; Notable for the following components:   RBC 3.72 (*)    Hemoglobin 10.7 (*)    HCT 33.5 (*)    Lymphs Abs 0.4 (*)    All other components within normal limits  URINE CULTURE  URINALYSIS, ROUTINE W REFLEX MICROSCOPIC    EKG EKG Interpretation  Date/Time:  Monday Jul 05 2020 09:39:32 EDT Ventricular Rate:  79 PR Interval:  217 QRS Duration: 87 QT Interval:  399 QTC Calculation: 458 R Axis:   9 Text Interpretation: Sinus rhythm Borderline prolonged PR interval Confirmed by Lavenia Atlas [phone]) on 07/05/2020 10:09:08 AM   Radiology DG Chest Portable 1 View  Result Date: 07/05/2020 CLINICAL DATA:  Cough and weakness.  Status post fall EXAM: PORTABLE CHEST 1 VIEW COMPARISON:   10/23/2019 FINDINGS: Heart size and mediastinal contours are normal. No pleural effusion or edema identified. Scar versus platelike atelectasis is noted in the left base. No airspace disease. IMPRESSION: Scar versus platelike atelectasis in the left base. Electronically Signed   By: Kerby Moors M.D.   On: 07/05/2020 10:17    Procedures Procedures   Medications Ordered in ED Medications  sodium chloride 0.9 % bolus 1,000 mL (1,000 mLs Intravenous New Bag/Given 07/05/20 1223)    ED Course  I have reviewed the triage vital signs and the nursing notes.  Pertinent labs & imaging results that were available during my care of the patient were reviewed by me and considered in my medical decision making (see chart for details).    MDM Rules/Calculators/A&P                          Alert 87 year old female no acute distress, nontoxic-appearing.  Patient presents from [facility].  Patient suffered falls on Friday and Saturday.  Facility called EMS this morning the patient "slumped," in chair.  Nurse reports that after repositioning patient was able to sit upright without difficulty.  Patient However is unsure of mechanism or any loss of consciousness.  Patient endorses contusion to frontal aspect of head, recalls.  Patient denies any other injuries or complaints of pain.  Patient is not on any blood thinners.  Patient is alert to person place and time.  Her daughter patient's speech slightly slower this morning than usual.  Patient endorses productive cough starting this weekend.  Patient endorses rhinorrhea and nasal congestion for multiple weeks.  Patient denies any fevers, chills, sore throat, abdominal pain, nausea, vomiting, diarrhea, dysuria, urinary frequency, hematuria.  Patient and patient's  daughter endorse weakness to left lower extremity as well as left forearm and wrist.    On physical exam CN II through XII intact.  Grip strength equal.  +5 strength to dorsiflexion and plantar  flexion bilaterally.  Sensation intact to light touch to bilateral upper and lower extremities.  No midline tenderness or deformity thoracic or lumbar spine. Low suspicion for CVA at this time however will obtain noncontrast head CT to evaluate for intracranial hemorrhage.  We will also obtain noncontrast CT of cervical spine due to patient's falls.  Will obtain CMP, CBC, urinalysis, chest x-ray, COVID testing, and urinalysis.  Chest x-ray shows no pleural effusion or edema, pneumothorax, consolidation.  Lungs clear to auscultation bilaterally.  Low suspicion for pneumonia time.  EKG shows sinus rhythm. CBC showed anemia with hemoglobin of 10.7 and hematocrit at 33.5; no source of bleeding. CMP shows creatinine slightly elevated at 1.06; BUN within normal limits. Urinalysis showed no signs of infection  Patient is COVID-positive.  Due to onset of symptoms within 5 days and patient's advanced age she meets criteria for oral COVID treatment.  Shared decision making with patient for this treatment.  Risks and benefits of molnuipravir and paxlovid were discussed with patient.  All patient's question's answered.  Patient refuses treatment at this time.    Normally would ambulate patient prior to discharge however patient is nonambulatory at baseline due to her left leg weakness.  With inability to ambulate patient oxygen saturation has been observed at her baseline.  Oxygen saturation 92 to 99% on room air.  Patient is hemodynamically stable at this time.  Will discharge patient and have her follow-up with her primary care provider as needed.  Discussed results, findings, treatment and follow up. Patient advised of return precautions. Patient verbalized understanding and agreed with plan.  Patient care and treatment were discussed with Dr.Horton.  Amber [name] was evaluated in Emergency Department on 07/05/2020 for the symptoms described in the history of present illness. She was evaluated in the  context of the global COVID-19 pandemic, which necessitated consideration that the patient might be at risk for infection with the SARS-CoV-2 virus that causes COVID-19. Institutional protocols and algorithms that pertain to the evaluation of patients at risk for COVID-19 are in a state of rapid change based on information released by regulatory bodies including the CDC and federal and state organizations. These policies and algorithms were followed during the patient's care in the ED.    Final Clinical Impression(s) / ED Diagnoses Final diagnoses:  [id number]    Rx / DC Orders ED Discharge Orders    None       Loni Beckwith, PA-C 07/05/20 [place], [place], DO 07/06/20 [phone]

## 2020-07-05 NOTE — ED Notes (Signed)
Family updated as to patient's status. Daughter Beverlee Nims called with update

## 2020-07-05 NOTE — ED Notes (Signed)
Patient transported to CT 

## 2020-07-05 NOTE — Discharge Instructions (Addendum)
You came to the emergency department today with reports of Covid-19 like symptoms.   You tested positive for COVID-19. Please isolate at home for at least 7 days after the day your symptoms initially began, and THEN at least 24 hours after you are fever-free without the help of medications (Tylenol/acetaminophen and Advil/ibuprofen/Motrin) AND your symptoms are improving.  You can alternate Tylenol/acetaminophen and Advil/ibuprofen/Motrin every 4 hours for sore throat, body aches, headache or fever.  Drink plenty of water.  Use saline nasal spray for congestion. Wash your hands frequently. Please rest as needed with frequent repositioning and ambulation as tolerated.    If you use a CPAP or BiPAP device for management of obstructive sleep apnea may continue to use it however use it when isolated from other individuals to avoid spread of COVID-19.   If you use a nebulizer administer medication such as albuterol you may continue to use it however only one isolated from other individuals to avoid the spread of COVID-19.  If your symptoms do not improve please follow-up with your primary care provider or urgent care.  Return to the ER for significant shortness of breath, uncontrollable vomiting, severe chest pain, inability to tolerate fluids, changes in mental status such as confusion or other concerning symptoms.

## 2020-07-05 NOTE — ED Triage Notes (Signed)
Pt BIB [facility] from [facility]. Facility called because pt was "slumped" over in her wheel chair. Per EMS pt was slumped over to her Left side but was sitting on her Left hip. EMS repositioned pt and she was sitting upright with no leaning, no facial droop or other new neuro deficits. Per daughter pt has fallen twice this weekend, Friday and Sunday, staff did not report that to EMS. daughter also reports pt has a hx of Left side weakness to LUE and LLE d/t chronic osteoarthritis. Pt unable to lift LUE or straighten it, it is constricted per her norm. Pt had a mass removed from her thigh which has caused her weakness to LLE   Pt has a slow speech which is normal per daughter her speech is just a little slower today than normal. Pt placed in a c-collar by EMS d/t recent falls as a cautionary   BP 112/62 HR 87 RR 17 96% RA  CBG 127  20g LW

## 2020-07-05 NOTE — ED Notes (Signed)
Family updated as to patient's status. Daughter, Beverlee Nims made aware pt tested positive for covid

## 2020-07-05 NOTE — ED Notes (Signed)
Called for [facility] 12th in list

## 2020-07-06 LAB — URINE CULTURE: Culture: NO GROWTH

## 2020-07-06 NOTE — ED Notes (Signed)
Pt discharged and wheeled out of the ED on a stretcher with the ambulance crew.

## 2020-07-15 ENCOUNTER — Emergency Department (HOSPITAL_COMMUNITY): Payer: Medicare Other

## 2020-07-15 ENCOUNTER — Encounter (HOSPITAL_COMMUNITY): Payer: Self-pay | Admitting: Emergency Medicine

## 2020-07-15 ENCOUNTER — Observation Stay (HOSPITAL_COMMUNITY)
Admission: EM | Admit: 2020-07-15 | Discharge: 2020-07-17 | Disposition: A | Discharge status: Home / Self Care | Payer: Medicare Other | Attending: Internal Medicine | Admitting: Internal Medicine

## 2020-07-15 DIAGNOSIS — N179 Acute kidney failure, unspecified: Secondary | ICD-10-CM | POA: Diagnosis not present

## 2020-07-15 DIAGNOSIS — E039 Hypothyroidism, unspecified: Secondary | ICD-10-CM | POA: Diagnosis not present

## 2020-07-15 DIAGNOSIS — R55 Syncope and collapse: Principal | ICD-10-CM

## 2020-07-15 DIAGNOSIS — I10 Essential (primary) hypertension: Secondary | ICD-10-CM | POA: Insufficient documentation

## 2020-07-15 DIAGNOSIS — E785 Hyperlipidemia, unspecified: Secondary | ICD-10-CM | POA: Diagnosis present

## 2020-07-15 DIAGNOSIS — Z7982 Long term (current) use of aspirin: Secondary | ICD-10-CM | POA: Diagnosis not present

## 2020-07-15 DIAGNOSIS — Z79899 Other long term (current) drug therapy: Secondary | ICD-10-CM | POA: Diagnosis not present

## 2020-07-15 DIAGNOSIS — Z96643 Presence of artificial hip joint, bilateral: Secondary | ICD-10-CM | POA: Diagnosis not present

## 2020-07-15 DIAGNOSIS — U071 COVID-19: Secondary | ICD-10-CM | POA: Diagnosis present

## 2020-07-15 DIAGNOSIS — Z8616 Personal history of COVID-19: Secondary | ICD-10-CM | POA: Diagnosis not present

## 2020-07-15 DIAGNOSIS — Z87891 Personal history of nicotine dependence: Secondary | ICD-10-CM | POA: Diagnosis not present

## 2020-07-15 DIAGNOSIS — R42 Dizziness and giddiness: Secondary | ICD-10-CM

## 2020-07-15 LAB — CBC
HCT: 41.9 % (ref 36.0–46.0)
Hemoglobin: 13.1 g/dL (ref 12.0–15.0)
MCH: 28.7 pg (ref 26.0–34.0)
MCHC: 31.3 g/dL (ref 30.0–36.0)
MCV: 91.9 fL (ref 80.0–100.0)
Platelets: 299 10*3/uL (ref 150–400)
RBC: 4.56 MIL/uL (ref 3.87–5.11)
RDW: 13.1 % (ref 11.5–15.5)
WBC: 19.1 10*3/uL — ABNORMAL HIGH (ref 4.0–10.5)
nRBC: 0 % (ref 0.0–0.2)

## 2020-07-15 LAB — COMPREHENSIVE METABOLIC PANEL
ALT: 32 U/L (ref 0–44)
AST: 30 U/L (ref 15–41)
Albumin: 3.2 g/dL — ABNORMAL LOW (ref 3.5–5.0)
Alkaline Phosphatase: 100 U/L (ref 38–126)
Anion gap: 10 (ref 5–15)
BUN: 32 mg/dL — ABNORMAL HIGH (ref 8–23)
CO2: 21 mmol/L — ABNORMAL LOW (ref 22–32)
Calcium: 8.7 mg/dL — ABNORMAL LOW (ref 8.9–10.3)
Chloride: 105 mmol/L (ref 98–111)
Creatinine, Ser: 1.79 mg/dL — ABNORMAL HIGH (ref 0.44–1.00)
GFR, Estimated: 27 mL/min — ABNORMAL LOW (ref >60)
Glucose, Bld: 137 mg/dL — ABNORMAL HIGH (ref 70–99)
Potassium: 4.1 mmol/L (ref 3.5–5.1)
Sodium: 136 mmol/L (ref 135–145)
Total Bilirubin: 0.8 mg/dL (ref 0.3–1.2)
Total Protein: 7.8 g/dL (ref 6.5–8.1)

## 2020-07-15 LAB — URINALYSIS, ROUTINE W REFLEX MICROSCOPIC
Bilirubin Urine: NEGATIVE
Glucose, UA: NEGATIVE mg/dL
Hgb urine dipstick: NEGATIVE
Ketones, ur: 5 mg/dL — AB
Nitrite: NEGATIVE
Protein, ur: 30 mg/dL — AB
Specific Gravity, Urine: 1.021 (ref 1.005–1.030)
pH: 5 (ref 5.0–8.0)

## 2020-07-15 LAB — LACTIC ACID, PLASMA
Lactic Acid, Venous: 0.7 mmol/L (ref 0.5–1.9)
Lactic Acid, Venous: 1.1 mmol/L (ref 0.5–1.9)

## 2020-07-15 LAB — CBG MONITORING, ED: Glucose-Capillary: 171 mg/dL — ABNORMAL HIGH (ref 70–99)

## 2020-07-15 MED ORDER — ONDANSETRON HCL 4 MG PO TABS: 4.0000 mg | ORAL_TABLET | Freq: Four times a day (QID) | ORAL | Status: DC | PRN

## 2020-07-15 MED ORDER — SODIUM CHLORIDE 0.9 % IV BOLUS
1000.0000 mL | Freq: Once | INTRAVENOUS | Status: AC
  Administered 2020-07-15: 1000 mL via INTRAVENOUS

## 2020-07-15 MED ORDER — SODIUM CHLORIDE 0.9% FLUSH
3.0000 mL | Freq: Two times a day (BID) | INTRAVENOUS | Status: DC
  Administered 2020-07-15 – 2020-07-16 (×3): 3 mL via INTRAVENOUS

## 2020-07-15 MED ORDER — SODIUM CHLORIDE 0.9 % IV SOLN: INTRAVENOUS | Status: DC

## 2020-07-15 MED ORDER — ONDANSETRON HCL 4 MG/2ML IJ SOLN: 4.0000 mg | Freq: Four times a day (QID) | INTRAMUSCULAR | Status: DC | PRN

## 2020-07-15 MED ORDER — ENOXAPARIN SODIUM 30 MG/0.3ML IJ SOSY
30.0000 mg | PREFILLED_SYRINGE | Freq: Every day | INTRAMUSCULAR | Status: DC
  Administered 2020-07-16: 30 mg via SUBCUTANEOUS
  Filled 2020-07-15 (×2): qty 0.3

## 2020-07-15 NOTE — H&P (Signed)
History and Physical   Amber [name] YIF:[id number] DOB: 1932-11-14 DOA: 07/15/2020  Referring MD/NP/PA: Dr. Maryan Rued  PCP: Heywood Bene, PA-C   Outpatient Specialists: None  Patient coming from: Assisted living facility  Chief Complaint: Syncope  HPI: Amber [name] is a 87 y.o. 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  Patient was apparently down for a period of time.  Was unresponsive for about 15 to 20 minutes.  She is weak.  Noted to be markedly hypotensive with systolic blood pressure in the 50s.  Was given a liter of normal saline at the time.  Blood pressure improved but when she came to the ER she was still hypotensive.  Suspicion for possible sepsis.  Patient has had poor oral intake apparently since her COVID-19 diagnosis.  She is out of the chest typical isolation..  In the ER patient was notably dehydrated with AKI and orthostatic.  She received 2 L of IVF fluids.  At this point her blood pressure has improved.  Patient however has symptoms of AKI and generalized weakness.  She has been admitted to the hospital for further evaluation and treatment..  ED Course: Initial temperature 99.1 blood pressure 95/45, pulse 92 respirate of 19 oxygen sat 96% on room air.  White count is 19.1 hemoglobin 13.1 platelets 299.  Sodium is 136 potassium 4.1 chloride 105 CO2 21, BUN 32 creatinine 1.79 and calcium 8.7.  Glucose is 137.  Urinalysis showed cloudy urine with many bacteria negative nitrite small leukocytes.  WBC 11-20.  Head CT without contrast is negative chest x-ray also negative.  Patient being admitted with post-COVID dehydration, AKI and generalized weakness.  Review of  Systems: As per HPI otherwise 10 point review of systems negative.    Past Medical History:  Diagnosis Date  . Complication of anesthesia    "woke up jumping and bouncing"  . COVID-19   . Hyperlipidemia   . Hypertension   . 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 Thyroid disease     Past Surgical History:  Procedure Laterality Date  . ABDOMINAL HYSTERECTOMY    . CHOLECYSTECTOMY N/A 03/27/2013   Procedure: LAPAROSCOPIC CHOLECYSTECTOMY WITH INTRAOPERATIVE CHOLANGIOGRAM;  Surgeon: Pedro Earls, MD;  Location: [facility];  Service: General;  Laterality: N/A;  . HIP ARTHROPLASTY Right 01/20/2016   Procedure: ARTHROPLASTY BIPOLAR HIP (HEMIARTHROPLASTY);  Surgeon: Renette Butters, MD;  Location: [facility];  Service: Orthopedics;  Laterality: Right;  . HIP ARTHROPLASTY Left 10/20/2017   Procedure: ARTHROPLASTY BIPOLAR HIP (HEMIARTHROPLASTY);  Surgeon: Hiram Gash, MD;  Location: [facility];  Service: Orthopedics;  Laterality: Left;  . KNEE SURGERY Left    approx 4 surgeries on left knee     reports that she quit smoking about 37 years ago. She has never used smokeless tobacco. She reports that she does not drink alcohol and does not use drugs.  No Known Allergies  Family History  Problem Relation Age of Onset  . Lung cancer Sister   . Bone cancer Brother   . Stomach cancer Sister      Prior to Admission medications   Medication Sig Start Date End Date Taking? Authorizing Provider  acetaminophen (TYLENOL) 500 MG tablet  Take 500 mg by mouth 2 (two) times daily.    [provider]  aspirin EC 81 MG tablet Take 81 mg by mouth daily.    [provider]  benazepril (LOTENSIN) 20 MG tablet Take 10 mg by mouth daily.    [provider]  fluticasone (FLONASE) 50 MCG/ACT nasal spray Place 1 spray into both nostrils daily as needed for allergies or rhinitis.    [provider]  levothyroxine (SYNTHROID) 50 MCG tablet Take 1 tablet (50 mcg total) by  mouth daily. 10/23/17 03/04/19  Florencia Reasons, MD  lovastatin (MEVACOR) 20 MG tablet Take 20 mg by mouth at bedtime. 11/17/15   [provider]  Multiple Vitamins-Minerals (MULTIVITAMIN PO) Take 1 tablet by mouth daily.    [provider]  Omega-3 Fatty Acids (FISH OIL) 1000 MG CAPS Take 1,000 mg by mouth daily.    [provider]  polyethylene glycol (MIRALAX / GLYCOLAX) packet Take 17 g by mouth daily. Patient taking differently: Take 17 g by mouth every 12 (twelve) hours as needed (for constipation). 10/24/17   Florencia Reasons, MD  senna (SENOKOT) 8.6 MG TABS tablet Take 1 tablet by mouth daily as needed for mild constipation.    [provider]  senna-docusate (SENOKOT-S) 8.6-50 MG tablet Take 1 tablet by mouth 2 (two) times daily. Patient not taking: Reported on 07/05/2020 10/23/17   Florencia Reasons, MD  vitamin B-12 (CYANOCOBALAMIN) 1000 MCG tablet Take 1,000 mcg by mouth daily.    [provider]  enoxaparin (LOVENOX) 40 MG/0.4ML injection Inject 0.4 mLs (40 mg total) into the skin daily. Patient not taking: Reported on 03/04/2019 10/21/17 12/11/19  Hiram Gash, MD    Physical Exam: Vitals:   07/15/20 1430 07/15/20 1547 07/15/20 1720 07/15/20 2027  BP: (!) 98/49  138/60 (!) 150/58  Pulse: 63  69 65  Resp: 17  14 16   Temp:  99.1 F (37.3 C)  97.8 F (36.6 C)  TempSrc:  Rectal  Oral  SpO2: 98%  97% 100%      Constitutional: Confused, acutely ill looking, no distress Vitals:   07/15/20 1430 07/15/20 1547 07/15/20 1720 07/15/20 2027  BP: (!) 98/49  138/60 (!) 150/58  Pulse: 63  69 65  Resp: 17  14 16   Temp:  99.1 F (37.3 C)  97.8 F (36.6 C)  TempSrc:  Rectal  Oral  SpO2: 98%  97% 100%   Eyes: PERRL, lids and conjunctivae normal ENMT: Mucous membranes are dry. Posterior pharynx clear of any exudate or lesions.Normal dentition.  Neck: normal, supple, no masses, no thyromegaly Respiratory: Coarse breath sounds bilaterally, no wheezing, no crackles.  Normal respiratory effort. No accessory muscle use.  Cardiovascular: Regular rate and rhythm, no murmurs / rubs / gallops. No extremity edema. 2+ pedal pulses. No carotid bruits.  Abdomen: no tenderness, no masses palpated. No hepatosplenomegaly. Bowel sounds positive.  Musculoskeletal: no clubbing / cyanosis. No joint deformity upper and lower extremities. Good ROM, no contractures. Normal muscle tone.  Skin: Dry coarse skin, no rashes, lesions, ulcers. No induration Neurologic: CN 2-12 grossly intact. Sensation intact, DTR normal. Strength 5/5 in all 4.  Psychiatric: Patient is confused and disoriented in place and time.  No agitation    Labs on Admission: I have personally reviewed following labs and imaging studies  CBC: Recent Labs  Lab 07/15/20 1425  WBC 19.1*  HGB 13.1  HCT 41.9  MCV 91.9  PLT 500   Basic Metabolic Panel: Recent  Labs  Lab 07/15/20 1425  NA 136  K 4.1  CL 105  CO2 21*  GLUCOSE 137*  BUN 32*  CREATININE 1.79*  CALCIUM 8.7*   GFR: Estimated Creatinine Clearance: 22.8 mL/min (A) (by C-G formula based on SCr of 1.79 mg/dL (H)). Liver Function Tests: Recent Labs  Lab 07/15/20 1425  AST 30  ALT 32  ALKPHOS 100  BILITOT 0.8  PROT 7.8  ALBUMIN 3.2*   No results for input(s): LIPASE, AMYLASE in the last 168 hours. No results for input(s): AMMONIA in the last 168 hours. Coagulation Profile: No results for input(s): INR, PROTIME in the last 168 hours. Cardiac Enzymes: No results for input(s): CKTOTAL, CKMB, CKMBINDEX, TROPONINI in the last 168 hours. BNP (last 3 results) No results for input(s): PROBNP in the last 8760 hours. HbA1C: No results for input(s): HGBA1C in the last 72 hours. CBG: Recent Labs  Lab 07/15/20 1333  GLUCAP 171*   Lipid Profile: No results for input(s): CHOL, HDL, LDLCALC, TRIG, CHOLHDL, LDLDIRECT in the last 72 hours. Thyroid Function Tests: No results for input(s): TSH, T4TOTAL, FREET4, T3FREE, THYROIDAB in the  last 72 hours. Anemia Panel: No results for input(s): VITAMINB12, FOLATE, FERRITIN, TIBC, IRON, RETICCTPCT in the last 72 hours. Urine analysis:    Component Value Date/Time   COLORURINE AMBER (A) 07/15/2020 1833   APPEARANCEUR CLOUDY (A) 07/15/2020 1833   LABSPEC 1.021 07/15/2020 1833   PHURINE 5.0 07/15/2020 1833   GLUCOSEU NEGATIVE 07/15/2020 1833   HGBUR NEGATIVE 07/15/2020 1833   BILIRUBINUR NEGATIVE 07/15/2020 1833   KETONESUR 5 (A) 07/15/2020 1833   PROTEINUR 30 (A) 07/15/2020 1833   UROBILINOGEN 1.0 03/26/2013 1537   NITRITE NEGATIVE 07/15/2020 1833   LEUKOCYTESUR SMALL (A) 07/15/2020 1833   Sepsis Labs: @LABRCNTIP (procalcitonin:4,lacticidven:4) )No results found for this or any previous visit (from the past 240 hour(s)).   Radiological Exams on Admission: CT Head Wo Contrast  Result Date: 07/15/2020 CLINICAL DATA:  Fall with head trauma, syncope EXAM: CT HEAD WITHOUT CONTRAST TECHNIQUE: Contiguous axial images were obtained from the base of the skull through the vertex without intravenous contrast. COMPARISON:  07/05/2020 FINDINGS: Brain: No evidence of acute infarction, hemorrhage, hydrocephalus, extra-axial collection or mass lesion/mass effect. Moderate low-density changes within the periventricular and subcortical white matter compatible with chronic microvascular ischemic change. Mild diffuse cerebral volume loss. Vascular: Atherosclerotic calcifications involving the large vessels of the skull base. No unexpected hyperdense vessel. Skull: Normal. Negative for fracture or focal lesion. Sinuses/Orbits: Left maxillary mucosal thickening. Probable right maxillary mucous retention cyst. Mastoid air cells are clear. Orbital structures within normal limits. Other: Negative for scalp hematoma. IMPRESSION: 1. No acute intracranial findings. 2. Chronic microvascular ischemic change and cerebral volume loss. Electronically Signed   By: Davina Poke D.O.   On: 07/15/2020 14:58   DG  Chest Port 1 View  Result Date: 07/15/2020 CLINICAL DATA:  Weakness and syncope EXAM: PORTABLE CHEST 1 VIEW COMPARISON:  Jul 05, 2020 FINDINGS: There is minimal scarring in the right base. There is an apparent small granuloma in the left upper lobe toward the apex. Lungs elsewhere clear. Heart size and pulmonary vascularity are normal. No adenopathy. There is aortic atherosclerosis. No bone lesions. IMPRESSION: Mild scarring right base. Small granuloma left upper lobe. No edema or airspace opacity. Heart size normal. Aortic Atherosclerosis (ICD10-I70.0). Electronically Signed   By: Lowella Grip III M.D.   On: 07/15/2020 14:11    EKG: Independently reviewed: Shows normal sinus rhythm with nonspecific ST  changes.  Rate of 69.  Assessment/Plan Principal Problem:   Syncope and collapse Active Problems:   Dizziness   Hypertension   Hypothyroidism   Hyperlipidemia   hx of COVID-19   AKI (acute kidney injury) ([place])     #1 syncope: Most likely secondary to dehydration.  Patient has AKI as well.  She has been having decreased oral intake since her COVID diagnosis.  Patient is notably hypotensive and orthostatic.  We will hydrate aggressively.  Monitor renal function.  Get echocardiogram.  #2 history of hypertension: Hold antihypertensives in the setting of hypotension as syncope.  Continue to monitor  #3 acute kidney injury: Most likely prerenal.  Aggressively hydrate and monitor  #5 hypothyroidism: Continue with levothyroxine  #6 recent COVID-19 infection: Patient considered outside the window for isolation.  Asymptomatic at this point.  DVT prophylaxis: Lovenox Code Status: DNR Family Communication: No family at bedside Disposition Plan: Back to skilled facility Consults called: None Admission status: Inpatient  Severity of Illness: The appropriate patient status for this patient is INPATIENT. Inpatient status is judged to be reasonable and necessary in order to provide the  required intensity of service to ensure the patient's safety. The patient's presenting symptoms, physical exam findings, and initial radiographic and laboratory data in the context of their chronic comorbidities is felt to place them at high risk for further clinical deterioration. Furthermore, it is not anticipated that the patient will be medically stable for discharge from the hospital within 2 midnights of admission. The following factors support the patient status of inpatient.   " The patient's presenting symptoms include confusion and weakness. " The worrisome physical exam findings include confusion. " The initial radiographic and laboratory data are worrisome because of creatinine 1.79. " The chronic co-morbidities include essential hypertension.   * I certify that at the point of admission it is my clinical judgment that the patient will require inpatient hospital care spanning beyond 2 midnights from the point of admission due to high intensity of service, high risk for further deterioration and high frequency of surveillance required.Barbette Merino MD [facility] Pager [phone]  If 7PM-7AM, please contact night-coverage [email or web address] Password [facility]  07/15/2020, 8:35 PM

## 2020-07-15 NOTE — ED Provider Notes (Signed)
[facility] EMERGENCY DEPARTMENT Provider Note   CSN: [id number] Arrival date & time: 07/15/20  1309     History No chief complaint on file.   Amber [name] is a 87 y.o. female.  HPI Level 5 caveat as patient is poor historian-she is able to tell me her name but is not oriented to month or year 87 year old female who presents today via EMS.  She is from a facility.  They report EMS that the patient had passed out at the table falling forward and striking her head.  They also reported that the patient was recently COVID-positive.  EMS reports that her initial systolic blood pressure was 50.  She received 1 L normal saline and her blood pressure normalized. Review of ED records reveals visit on Jul 05, 2020 with a complaint of generalized weakness and falls.  At that time she was diagnosed with COVID.  They noted a contusion on her forehead.  She is not reported to be on any blood thinners. Patient states that she is unable to move her left arm due to a prior fracture She has decreased movement of her left leg due to prior fracture.   Dust with med tech at facility.  She reports that the patient was [place] and then unresponsive unresponsive for 15-20 minutes-  Discussed with daughter Amber [name].  She states that patient is no code.  Daughter is power of attorney. Past Medical History:  Diagnosis Date  . Complication of anesthesia    "woke up jumping and bouncing"  . COVID-19   . Hyperlipidemia   . Hypertension   . 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 Thyroid disease     Patient Active Problem List   Diagnosis Date Noted  . Proximal Femur Fracture, left [place] Fall 10/19/2017  . Femur fracture ([place]) 10/19/2017  . Dizziness 01/19/2016  . Fall 01/19/2016  . Closed right hip fracture ([place]) 01/19/2016  . Hypertension 01/19/2016  . Hypothyroidism 01/19/2016  . Hyperlipidemia 01/19/2016  . Hip fracture ([place]) 01/19/2016  . Status post  laparoscopic cholecystectomy 03/27/2013    Past Surgical History:  Procedure Laterality Date  . ABDOMINAL HYSTERECTOMY    . CHOLECYSTECTOMY N/A 03/27/2013   Procedure: LAPAROSCOPIC CHOLECYSTECTOMY WITH INTRAOPERATIVE CHOLANGIOGRAM;  Surgeon: Pedro Earls, MD;  Location: [facility];  Service: General;  Laterality: N/A;  . HIP ARTHROPLASTY Right 01/20/2016   Procedure: ARTHROPLASTY BIPOLAR HIP (HEMIARTHROPLASTY);  Surgeon: Renette Butters, MD;  Location: [facility];  Service: Orthopedics;  Laterality: Right;  . HIP ARTHROPLASTY Left 10/20/2017   Procedure: ARTHROPLASTY BIPOLAR HIP (HEMIARTHROPLASTY);  Surgeon: Hiram Gash, MD;  Location: [facility];  Service: Orthopedics;  Laterality: Left;  . KNEE SURGERY Left    approx 4 surgeries on left knee     OB History   No obstetric history on file.     Family History  Problem Relation Age of Onset  . Lung cancer Sister   . Bone cancer Brother   . Stomach cancer Sister     Social History   Tobacco Use  . Smoking status: Former Smoker    Quit date: 03/27/1983    Years since quitting: 37.3  . Smokeless tobacco: Never Used  Vaping Use  . Vaping Use: Never used  Substance Use Topics  . Alcohol use: No  . Drug use: No    Home Medications Prior to Admission medications   Medication Sig Start Date End Date Taking?  Authorizing Provider  acetaminophen (TYLENOL) 500 MG tablet Take 500 mg by mouth 2 (two) times daily.    [provider]  aspirin EC 81 MG tablet Take 81 mg by mouth daily.    [provider]  benazepril (LOTENSIN) 20 MG tablet Take 10 mg by mouth daily.    [provider]  fluticasone (FLONASE) 50 MCG/ACT nasal spray Place 1 spray into both nostrils daily as needed for allergies or rhinitis.    [provider]  levothyroxine (SYNTHROID) 50 MCG tablet Take 1 tablet (50 mcg total) by mouth daily. 10/23/17 03/04/19  Florencia Reasons, MD  lovastatin (MEVACOR) 20 MG tablet Take 20 mg by mouth at bedtime. 11/17/15    [provider]  Multiple Vitamins-Minerals (MULTIVITAMIN PO) Take 1 tablet by mouth daily.    [provider]  Omega-3 Fatty Acids (FISH OIL) 1000 MG CAPS Take 1,000 mg by mouth daily.    [provider]  polyethylene glycol (MIRALAX / GLYCOLAX) packet Take 17 g by mouth daily. Patient taking differently: Take 17 g by mouth every 12 (twelve) hours as needed (for constipation). 10/24/17   Florencia Reasons, MD  senna (SENOKOT) 8.6 MG TABS tablet Take 1 tablet by mouth daily as needed for mild constipation.    [provider]  senna-docusate (SENOKOT-S) 8.6-50 MG tablet Take 1 tablet by mouth 2 (two) times daily. Patient not taking: Reported on 07/05/2020 10/23/17   Florencia Reasons, MD  vitamin B-12 (CYANOCOBALAMIN) 1000 MCG tablet Take 1,000 mcg by mouth daily.    [provider]  enoxaparin (LOVENOX) 40 MG/0.4ML injection Inject 0.4 mLs (40 mg total) into the skin daily. Patient not taking: Reported on 03/04/2019 10/21/17 12/11/19  Hiram Gash, MD    Allergies    Patient has no known allergies.  Review of Systems   Review of Systems  Physical Exam Updated Vital Signs There were no vitals taken for this visit.  Physical Exam Vitals and nursing note reviewed.  Constitutional:      Appearance: She is ill-appearing.     Comments: Alert but slowed  HENT:     Head: Normocephalic.     Comments: Contusion forehead purple to yellowish discolored    Right Ear: External ear normal.     Left Ear: External ear normal.     Nose: Nose normal.     Mouth/Throat:     Pharynx: Oropharynx is clear.  Eyes:     Pupils: Pupils are equal, round, and reactive to light.  Cardiovascular:     Rate and Rhythm: Normal rate and regular rhythm.     Pulses: Normal pulses.     Heart sounds: Normal heart sounds.  Pulmonary:     Effort: Pulmonary effort is normal.  Abdominal:     General: Bowel sounds are normal.     Palpations: Abdomen is soft.     Comments: Left flank hematoma  with purplish discoloration  Musculoskeletal:        General: Normal range of motion.     Cervical back: Normal range of motion.  Skin:    General: Skin is warm and dry.     Capillary Refill: Capillary refill takes less than 2 seconds.  Neurological:     General: No focal deficit present.     Mental Status: She is alert.     Cranial Nerves: No cranial nerve deficit.     Sensory: No sensory deficit.     Motor: No weakness.  Psychiatric:  Mood and Affect: Mood normal.        Behavior: Behavior normal.     ED Results / Procedures / Treatments   Labs (all labs ordered are listed, but only abnormal results are displayed) Labs Reviewed  CBC  COMPREHENSIVE METABOLIC PANEL    EKG EKG Interpretation  Date/Time:  Thursday July 15 2020 13:19:30 EDT Ventricular Rate:  69 PR Interval:    QRS Duration: 100 QT Interval:  444 QTC Calculation: 476 R Axis:   17 Text Interpretation: Normal sinus rhythm Nonspecific T abnormalities, lateral leads Confirmed by Pattricia Boss [phone]) on 07/15/2020 3:53:01 PM   Radiology CT Head Wo Contrast  Result Date: 07/15/2020 CLINICAL DATA:  Fall with head trauma, syncope EXAM: CT HEAD WITHOUT CONTRAST TECHNIQUE: Contiguous axial images were obtained from the base of the skull through the vertex without intravenous contrast. COMPARISON:  07/05/2020 FINDINGS: Brain: No evidence of acute infarction, hemorrhage, hydrocephalus, extra-axial collection or mass lesion/mass effect. Moderate low-density changes within the periventricular and subcortical white matter compatible with chronic microvascular ischemic change. Mild diffuse cerebral volume loss. Vascular: Atherosclerotic calcifications involving the large vessels of the skull base. No unexpected hyperdense vessel. Skull: Normal. Negative for fracture or focal lesion. Sinuses/Orbits: Left maxillary mucosal thickening. Probable right maxillary mucous retention cyst. Mastoid air cells are clear. Orbital  structures within normal limits. Other: Negative for scalp hematoma. IMPRESSION: 1. No acute intracranial findings. 2. Chronic microvascular ischemic change and cerebral volume loss. Electronically Signed   By: Davina Poke D.O.   On: 07/15/2020 14:58   DG Chest Port 1 View  Result Date: 07/15/2020 CLINICAL DATA:  Weakness and syncope EXAM: PORTABLE CHEST 1 VIEW COMPARISON:  Jul 05, 2020 FINDINGS: There is minimal scarring in the right base. There is an apparent small granuloma in the left upper lobe toward the apex. Lungs elsewhere clear. Heart size and pulmonary vascularity are normal. No adenopathy. There is aortic atherosclerosis. No bone lesions. IMPRESSION: Mild scarring right base. Small granuloma left upper lobe. No edema or airspace opacity. Heart size normal. Aortic Atherosclerosis (ICD10-I70.0). Electronically Signed   By: Lowella Grip III M.D.   On: 07/15/2020 14:11    Procedures Procedures   Medications Ordered in ED Medications - No data to display  ED Course  I have reviewed the triage vital signs and the nursing notes.  Pertinent labs & imaging results that were available during my care of the patient were reviewed by me and considered in my medical decision making (see chart for details).    MDM Rules/Calculators/A&P                         Patient presents after syncopal episode and altered mental status.  Here is initially reported to be hypotensive in the field and received a liter of saline and was normotensive.  However blood pressures have trended down and last 3 blood pressures in the mid 90s with 2 maps under 60.  Patient has a leukocytosis but otherwise has no SIRS criteria or source of infection at this time.  We are still awaiting urinalysis.  Her chest x-Amna Welker is clear.  She has had recent COVID.  This was diagnosed May 23 and she just came out of quarantine today. She appears to have some volume depletion.  Her daughter suspects that she was somewhat  dehydrated.  Her labs are consistent with this with an acute kidney injury with creatinine of 1.79 with first prior of 1.06 on  May 23. Sepsis in differential but not confirmed at this time.  No definite source of infection, she does have a leukocytosis, and a new acute kidney injury with creatinine greater than 0.5 of her baseline. Plan ongoing IV fluids for resuscitation. 1 syncope 2- altered mental status 3 hypotension-volume depletion vs sepsis- lactic acid and urine pending. 4- AKI Care discussed with Dr. Maryan Rued who will follow up labs and disposition to admission when completed.   Final Clinical Impression(s) / ED Diagnoses Final diagnoses:  None    Rx / DC Orders ED Discharge Orders    None       Pattricia Boss, MD 07/15/20 1658

## 2020-07-15 NOTE — ED Notes (Signed)
Tried to draw lactic on pt x 2. Walked to mini lab and spoke to tech. She said she will come draw

## 2020-07-15 NOTE — ED Triage Notes (Signed)
Pt from [facility] after witnessed syncopal episode while sitting at the table eating. Hit face on the table. Initial bp 80/50 with improvement after 500 cc bolus. Pt sts she feels weak and sick.

## 2020-07-15 NOTE — ED Provider Notes (Signed)
Pt's lactic acid is neg and UA without acute UTI.  Only 11-20WBC and many bacteria most likely chronic colonization.  Will admit for syncope, hypotension and AKI.   Blanchie Dessert, MD 07/15/20 1944

## 2020-07-16 ENCOUNTER — Observation Stay (HOSPITAL_BASED_OUTPATIENT_CLINIC_OR_DEPARTMENT_OTHER): Payer: Medicare Other

## 2020-07-16 DIAGNOSIS — R55 Syncope and collapse: Secondary | ICD-10-CM | POA: Diagnosis not present

## 2020-07-16 LAB — COMPREHENSIVE METABOLIC PANEL
ALT: 27 U/L (ref 0–44)
AST: 26 U/L (ref 15–41)
Albumin: 2.8 g/dL — ABNORMAL LOW (ref 3.5–5.0)
Alkaline Phosphatase: 90 U/L (ref 38–126)
Anion gap: 11 (ref 5–15)
BUN: 35 mg/dL — ABNORMAL HIGH (ref 8–23)
CO2: 19 mmol/L — ABNORMAL LOW (ref 22–32)
Calcium: 8.2 mg/dL — ABNORMAL LOW (ref 8.9–10.3)
Chloride: 109 mmol/L (ref 98–111)
Creatinine, Ser: 1.41 mg/dL — ABNORMAL HIGH (ref 0.44–1.00)
GFR, Estimated: 36 mL/min — ABNORMAL LOW (ref >60)
Glucose, Bld: 85 mg/dL (ref 70–99)
Potassium: 4.2 mmol/L (ref 3.5–5.1)
Sodium: 139 mmol/L (ref 135–145)
Total Bilirubin: 0.8 mg/dL (ref 0.3–1.2)
Total Protein: 7 g/dL (ref 6.5–8.1)

## 2020-07-16 LAB — CBC
HCT: 39.9 % (ref 36.0–46.0)
Hemoglobin: 13 g/dL (ref 12.0–15.0)
MCH: 29.2 pg (ref 26.0–34.0)
MCHC: 32.6 g/dL (ref 30.0–36.0)
MCV: 89.7 fL (ref 80.0–100.0)
Platelets: 241 10*3/uL (ref 150–400)
RBC: 4.45 MIL/uL (ref 3.87–5.11)
RDW: 13.1 % (ref 11.5–15.5)
WBC: 10.4 10*3/uL (ref 4.0–10.5)
nRBC: 0 % (ref 0.0–0.2)

## 2020-07-16 LAB — ECHOCARDIOGRAM COMPLETE
Area-P 1/2: 2.34 cm2
Height: 68 in
S' Lateral: 3 cm
Weight: 2278.67 oz

## 2020-07-16 LAB — MRSA PCR SCREENING: MRSA by PCR: NEGATIVE

## 2020-07-16 NOTE — Progress Notes (Signed)
Progress Note    Amber [name]  JOI:[id number] DOB: 1932-03-25  DOA: 07/15/2020 PCP: Heywood Bene, PA-C    Brief Narrative:    Medical records reviewed and are as summarized below:  Amber [name] is an 87 y.o. 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    Assessment/Plan:   Principal Problem:   Syncope and collapse Active Problems:   Dizziness   Hypertension   Hypothyroidism   Hyperlipidemia   hx of COVID-19   AKI (acute kidney injury) ([place])   syncope:  -Most likely secondary to dehydration.  Patient has AKI as well.  She has been having decreased oral intake since her COVID diagnosis.  Patient is notably hypotensive and orthostatic. -IVF -PT eval: from ALF -echo: EF 55-60%  history of hypertension:  -Hold antihypertensives in the setting of hypotension as syncope.  Continue to monitor  acute kidney injury:  -Most likely prerenal -IVF -daily labs  hypothyroidism:  -Continue with levothyroxine  recent COVID-19 infection:  -Asymptomatic at this point.   Family Communication/Anticipated D/C date and plan/Code Status   DVT prophylaxis: Lovenox ordered. Code Status: DNR Disposition Plan: Status is: Observation  The patient will require care spanning > 2 midnights and should be moved to inpatient because: Inpatient level of care appropriate due to severity of illness  Dispo: The patient is from: aLF?              Anticipated d/c is to: ALF              Patient currently is not medically stable to d/c.   Difficult to place patient No         Medical Consultants:    None.    Subjective:   No  complaints- eating lunch  Objective:    Vitals:   07/15/20 2126 07/15/20 2354 07/16/20 0325 07/16/20 0745  BP: 121/62 133/63 (!) 145/62 (!) 115/56  Pulse: 74 74 74   Resp: 12  15 14   Temp: 97.8 F (36.6 C) 98 F (36.7 C) 98.1 F (36.7 C) 97.8 F (36.6 C)  TempSrc: Oral Oral Oral Oral  SpO2: 96% 98% 96%   Weight: 64.8 kg  64.6 kg   Height: 5\' 8"  (1.727 m)       Intake/Output Summary (Last 24 hours) at 07/16/2020 1305 Last data filed at 07/16/2020 1229 Gross per 24 hour  Intake 483.36 ml  Output --  Net 483.36 ml   Filed Weights   07/15/20 2126 07/16/20 0325  Weight: 64.8 kg 64.6 kg    Exam:  General: Appearance:    elderly female in no acute distress     Lungs:     respirations unlabored  Heart:    Normal heart rate.      Neurologic:   Awake, alert, pleasant and cooperative     Data Reviewed:   I have personally reviewed following labs and imaging studies:  Labs: Labs show the following:   Basic Metabolic Panel: Recent Labs  Lab 07/15/20 1425 07/16/20 0046  NA 136 139  K 4.1 4.2  CL 105 109  CO2 21* 19*  GLUCOSE 137* 85  BUN 32* 35*  CREATININE 1.79* 1.41*  CALCIUM 8.7* 8.2*  GFR Estimated Creatinine Clearance: 28.9 mL/min (A) (by C-G formula based on SCr of 1.41 mg/dL (H)). Liver Function Tests: Recent Labs  Lab 07/15/20 1425 07/16/20 0046  AST 30 26  ALT 32 27  ALKPHOS 100 90  BILITOT 0.8 0.8  PROT 7.8 7.0  ALBUMIN 3.2* 2.8*   No results for input(s): LIPASE, AMYLASE in the last 168 hours. No results for input(s): AMMONIA in the last 168 hours. Coagulation profile No results for input(s): INR, PROTIME in the last 168 hours.  CBC: Recent Labs  Lab 07/15/20 1425 07/16/20 0046  WBC 19.1* 10.4  HGB 13.1 13.0  HCT 41.9 39.9  MCV 91.9 89.7  PLT 299 241   Cardiac Enzymes: No results for input(s): CKTOTAL, CKMB, CKMBINDEX, TROPONINI in the last 168 hours. BNP (last 3 results) No results for input(s): PROBNP in the last 8760  hours. CBG: Recent Labs  Lab 07/15/20 1333  GLUCAP 171*   D-Dimer: No results for input(s): DDIMER in the last 72 hours. Hgb A1c: No results for input(s): HGBA1C in the last 72 hours. Lipid Profile: No results for input(s): CHOL, HDL, LDLCALC, TRIG, CHOLHDL, LDLDIRECT in the last 72 hours. Thyroid function studies: No results for input(s): TSH, T4TOTAL, T3FREE, THYROIDAB in the last 72 hours.  Invalid input(s): FREET3 Anemia work up: No results for input(s): VITAMINB12, FOLATE, FERRITIN, TIBC, IRON, RETICCTPCT in the last 72 hours. Sepsis Labs: Recent Labs  Lab 07/15/20 1425 07/15/20 1839 07/15/20 2130 07/16/20 0046  WBC 19.1*  --   --  10.4  LATICACIDVEN  --  0.7 1.1  --     Microbiology Recent Results (from the past 240 hour(s))  MRSA PCR Screening     Status: None   Collection Time: 07/16/20 12:14 AM   Specimen: Nasal Mucosa; Nasopharyngeal  Result Value Ref Range Status   MRSA by PCR NEGATIVE NEGATIVE Final    Comment:        The GeneXpert MRSA Assay (FDA approved for NASAL specimens only), is one component of a comprehensive MRSA colonization surveillance program. It is not intended to diagnose MRSA infection nor to guide or monitor treatment for MRSA infections. Performed at [facility], [place] [address] [place], [place] [address]     Procedures and diagnostic studies:  CT Head Wo Contrast  Result Date: 07/15/2020 CLINICAL DATA:  Fall with head trauma, syncope EXAM: CT HEAD WITHOUT CONTRAST TECHNIQUE: Contiguous axial images were obtained from the base of the skull through the vertex without intravenous contrast. COMPARISON:  07/05/2020 FINDINGS: Brain: No evidence of acute infarction, hemorrhage, hydrocephalus, extra-axial collection or mass lesion/mass effect. Moderate low-density changes within the periventricular and subcortical white matter compatible with chronic microvascular ischemic change. Mild diffuse cerebral volume loss. Vascular:  Atherosclerotic calcifications involving the large vessels of the skull base. No unexpected hyperdense vessel. Skull: Normal. Negative for fracture or focal lesion. Sinuses/Orbits: Left maxillary mucosal thickening. Probable right maxillary mucous retention cyst. Mastoid air cells are clear. Orbital structures within normal limits. Other: Negative for scalp hematoma. IMPRESSION: 1. No acute intracranial findings. 2. Chronic microvascular ischemic change and cerebral volume loss. Electronically Signed   By: Davina Poke D.O.   On: 07/15/2020 14:58   DG Chest Port 1 View  Result Date: 07/15/2020 CLINICAL DATA:  Weakness and syncope EXAM: PORTABLE CHEST 1 VIEW COMPARISON:  Jul 05, 2020 FINDINGS: There is minimal scarring in the right base. There is an apparent small granuloma in the left upper lobe toward the apex. Lungs  elsewhere clear. Heart size and pulmonary vascularity are normal. No adenopathy. There is aortic atherosclerosis. No bone lesions. IMPRESSION: Mild scarring right base. Small granuloma left upper lobe. No edema or airspace opacity. Heart size normal. Aortic Atherosclerosis (ICD10-I70.0). Electronically Signed   By: Lowella Grip III M.D.   On: 07/15/2020 14:11   ECHOCARDIOGRAM COMPLETE  Result Date: 07/16/2020    ECHOCARDIOGRAM REPORT   Patient Name:   SHARIYA GASTER Date of Exam: 07/16/2020 Medical Rec #:  [id number]      Height:       68.0 in Accession #:    [id number]     Weight:       142.4 lb Date of Birth:  Oct 05, 1932      BSA:          1.769 m Patient Age:    48 years       BP:           120/51 mmHg Patient Gender: F              HR:           63 bpm. Exam Location:  Inpatient Procedure: 2D Echo, Color Doppler and Cardiac Doppler Indications:     R55 Syncope  History:         Patient has no prior history of Echocardiogram examinations.                  Risk Factors:Hypertension and Dyslipidemia. COVID+ at time of                  study.  Sonographer:     Raquel Sarna Senior RDCS Referring  Phys:  [place] Diagnosing Phys: Gwyndolyn Kaufman MD  Sonographer Comments: Technically difficult due to lung interference and patient immobility. IMPRESSIONS  1. Limited study due to poor acoustic windows. Left ventricular ejection fraction, by estimation, is 55 to 60%. The left ventricle has normal function. Left ventricular endocardial border not optimally defined to evaluate regional wall motion, however, appears grossly normal. Left ventricular diastolic parameters are consistent with Grade I diastolic dysfunction (impaired relaxation).  2. Right ventricular systolic function is normal. The right ventricular size is normal. There is normal pulmonary artery systolic pressure. The estimated right ventricular systolic pressure is 03.5 mmHg.  3. Left atrial size was mildly dilated.  4. The mitral valve is grossly normal. Trivial mitral valve regurgitation.  5. The aortic valve was not well visualized. Aortic valve regurgitation is not visualized. No aortic stenosis is present.  6. The inferior vena cava is normal in size with greater than 50% respiratory variability, suggesting right atrial pressure of 3 mmHg. Comparison(s): No prior Echocardiogram. FINDINGS  Left Ventricle: Limited study due to poor acoustic windows. Left ventricular ejection fraction, by estimation, is 55 to 60%. The left ventricle has normal function. Left ventricular endocardial border not optimally defined to evaluate regional wall motion. The left ventricular internal cavity size was normal in size. There is no left ventricular hypertrophy. Left ventricular diastolic parameters are consistent with Grade I diastolic dysfunction (impaired relaxation). Right Ventricle: The right ventricular size is normal. No increase in right ventricular wall thickness. Right ventricular systolic function is normal. There is normal pulmonary artery systolic pressure. The tricuspid regurgitant velocity is 2.66 m/s, and  with an assumed right atrial  pressure of 3 mmHg, the estimated right ventricular systolic pressure is 00.9 mmHg. Left Atrium: Left atrial size was mildly dilated. Right Atrium: Right atrial size was normal  in size. Pericardium: There is no evidence of pericardial effusion. Presence of pericardial fat pad. Mitral Valve: The mitral valve is grossly normal. There is mild thickening of the mitral valve leaflet(s). There is mild calcification of the mitral valve leaflet(s). Mild mitral annular calcification. Trivial mitral valve regurgitation. Tricuspid Valve: The tricuspid valve is normal in structure. Tricuspid valve regurgitation is trivial. Aortic Valve: The aortic valve was not well visualized. Aortic valve regurgitation is not visualized. No aortic stenosis is present. Pulmonic Valve: The pulmonic valve was not well visualized. Pulmonic valve regurgitation is not visualized. Aorta: The aortic root is normal in size and structure. Venous: The inferior vena cava is normal in size with greater than 50% respiratory variability, suggesting right atrial pressure of 3 mmHg. IAS/Shunts: No atrial level shunt detected by color flow Doppler.  LEFT VENTRICLE PLAX 2D LVIDd:         4.30 cm  Diastology LVIDs:         3.00 cm  LV e' medial:    6.42 cm/s LV PW:         0.90 cm  LV E/e' medial:  10.3 LV IVS:        0.80 cm  LV e' lateral:   7.18 cm/s LVOT diam:     1.80 cm  LV E/e' lateral: 9.2 LV SV:         54 LV SV Index:   31 LVOT Area:     2.54 cm  RIGHT VENTRICLE RV S prime:     11.10 cm/s TAPSE (M-mode): 1.7 cm LEFT ATRIUM             Index       RIGHT ATRIUM           Index LA diam:        4.10 cm 2.32 cm/m  RA Area:     13.10 cm LA Vol (A2C):   49.0 ml 27.70 ml/m RA Volume:   29.40 ml  16.62 ml/m LA Vol (A4C):   60.8 ml 34.37 ml/m LA Biplane Vol: 58.2 ml 32.90 ml/m  AORTIC VALVE LVOT Vmax:   92.00 cm/s LVOT Vmean:  63.700 cm/s LVOT VTI:    0.213 m  AORTA Ao Root diam: 2.70 cm MITRAL VALVE               TRICUSPID VALVE MV Area (PHT): 2.34 cm     TR Peak grad:   28.3 mmHg MV Decel Time: 324 msec    TR Vmax:        266.00 cm/s MV E velocity: 66.40 cm/s MV A velocity: 76.30 cm/s  SHUNTS MV E/A ratio:  0.87        Systemic VTI:  0.21 m                            Systemic Diam: 1.80 cm Gwyndolyn Kaufman MD Electronically signed by Gwyndolyn Kaufman MD Signature Date/Time: 07/16/2020/11:24:09 AM    Final (Updated)     Medications:   . enoxaparin (LOVENOX) injection  30 mg Subcutaneous Daily  . sodium chloride flush  3 mL Intravenous Q12H   Continuous Infusions: . sodium chloride 125 mL/hr at 07/16/20 1227     LOS: 0 days   Geradine Girt  [facility]   How to contact the [facility] Attending or Consulting provider [place] or covering provider during after hours [place], for this patient?  1. Check the  care team in [facility] and look for a) attending/consulting [place] provider listed and b) the [facility] team listed 2. Log into [email or web address] and use [place]'s universal password to access. If you do not have the password, please contact the hospital operator. 3. Locate the [facility] provider you are looking for under [facility] and page to a number that you can be directly reached. 4. If you still have difficulty reaching the provider, please page the [facility] (Director on Call) for the Hospitalists listed on amion for assistance.  07/16/2020, 1:05 PM

## 2020-07-16 NOTE — Evaluation (Signed)
Physical Therapy Evaluation Patient Details Name: Amber [name] MRN: [id number] DOB: 02/09/1933 Today's Date: 07/16/2020   History of Present Illness  Pt is an 87 y/o female presenting from an ALF to ED on 07/15/2020  for a syncopal episode after being found down for a period of time and was unresponsive for 15-20 minutes. There is suspicion for possible sepsis, with AKI and orthostatic. 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  Clinical Impression  Pt demonstrates ability to perform bed mobility and transfers, requiring physical assistance to perform. Pt requires multiple cues for hand placement during transfers. Pt requires physical assistance to maintain upright seated posture. Pt is adamant that she is unable to transfer to a recliner, reporting that she is only able to sit in a wheelchair. Pt declines further mobility and states that she has not been able to perform mobility for years. Per pt reports, pt's mobility is near baseline, requiring assistance for all bed mobility and transfers. Pt demonstrates deficits in overall strength, endurance, power, activity tolerance, and will benefit from acute PT to improve functional mobility. No PT follow up, as pt reports she has been receiving assistance to perform ADLs and with mobility at [facility]. .lis     Follow Up Recommendations No PT follow up    Equipment Recommendations  None recommended by PT    Recommendations for Other Services       Precautions / Restrictions Precautions Precautions: Fall Restrictions Weight Bearing Restrictions: No      Mobility  Bed Mobility Overal bed mobility: Needs Assistance Bed Mobility: Rolling;Sidelying to Sit Rolling: Min guard Sidelying to sit: Min assist;HOB elevated       General bed mobility  comments: min A for LE to EOB and with trunk.    Transfers Overall transfer level: Needs assistance Equipment used: None Transfers: Sit to/from Stand Sit to Stand: Min assist;+2 physical assistance         General transfer comment: Pt declines stand>pivot transfer, reporting that she does not sit in a recliner and only sits in w/c.  Ambulation/Gait                Stairs            Wheelchair Mobility    Modified Rankin (Stroke Patients Only)       Balance Overall balance assessment: Needs assistance Sitting-balance support: Feet supported;No upper extremity supported Sitting balance-Leahy Scale: Poor Sitting balance - Comments: Pt needs support to maintain upright posture. Postural control: Left lateral lean;Posterior lean Standing balance support: During functional activity Standing balance-Leahy Scale: Poor Standing balance comment: Pt requires min A +2 to maintain standing posture.                             Pertinent Vitals/Pain Pain Assessment: Faces Faces Pain Scale: Hurts even more Pain Location: L heel Pain Descriptors / Indicators: Grimacing;Discomfort Pain Intervention(s): Monitored during session;Limited activity within patient's tolerance    Home Living Family/patient expects to be discharged to:: Assisted living [facility])               Home Equipment: Gilford Rile - 2 wheels;Cane - single point;Wheelchair - manual      Prior Function Level of Independence: Needs assistance   Gait / Transfers Assistance Needed: Pt reports propelling w/c short distances  at ALF. Pt reports she receives assistance for bed mobiltiy as well as to transfer to w/c. Pt reports she is pushed in w/c to dining hall.  ADL's / Homemaking Assistance Needed: Pt receives assistance with bathing and dressing.        Hand Dominance        Extremity/Trunk Assessment   Upper Extremity Assessment Upper Extremity Assessment: Generalized weakness     Lower Extremity Assessment Lower Extremity Assessment: Generalized weakness    Cervical / Trunk Assessment Cervical / Trunk Assessment: Kyphotic  Communication   Communication: No difficulties  Cognition Arousal/Alertness: Awake/alert Behavior During Therapy: WFL for tasks assessed/performed Overall Cognitive Status: No family/caregiver present to determine baseline cognitive functioning                                        General Comments General comments (skin integrity, edema, etc.): VSS on RA.    Exercises     Assessment/Plan    PT Assessment Patient needs continued PT services  PT Problem List Decreased strength;Decreased activity tolerance;Decreased balance;Decreased mobility;Decreased safety awareness;Decreased knowledge of precautions;Pain       PT Treatment Interventions Therapeutic activities;Therapeutic exercise;Balance training;Functional mobility training;DME instruction;Patient/family education    PT Goals (Current goals can be found in the Care Plan section)  Acute Rehab PT Goals Patient Stated Goal: manage pain PT Goal Formulation: With patient Time For Goal Achievement: 07/30/20 Potential to Achieve Goals: Fair    Frequency Min 2X/week   Barriers to discharge        Co-evaluation               AM-PAC PT "6 Clicks" Mobility  Outcome Measure Help needed turning from your back to your side while in a flat bed without using bedrails?: A Little Help needed moving from lying on your back to sitting on the side of a flat bed without using bedrails?: A Little Help needed moving to and from a bed to a chair (including a wheelchair)?: A Lot Help needed standing up from a chair using your arms (e.g., wheelchair or bedside chair)?: A Little Help needed to walk in hospital room?: A Lot Help needed climbing 3-5 steps with a railing? : Total 6 Click Score: 14    End of Session Equipment Utilized During Treatment: Gait  belt Activity Tolerance: Patient limited by pain Patient left: in bed;with call bell/phone within reach;with bed alarm set Nurse Communication: Mobility status PT Visit Diagnosis: Unsteadiness on feet (R26.81);Other abnormalities of gait and mobility (R26.89);History of falling (Z91.81);Muscle weakness (generalized) (M62.81);Difficulty in walking, not elsewhere classified (R26.2);Pain Pain - Right/Left: Left Pain - part of body:  (heel)    Time: 1950-9326 PT Time Calculation (min) (ACUTE ONLY): 36 min   Charges:   PT Evaluation $PT Eval Low Complexity: 1 Low          Acute Rehab  Pager: [phone]   Garwin Brothers, SPT  07/16/2020, 6:01 PM

## 2020-07-16 NOTE — Progress Notes (Signed)
Echocardiogram 2D Echocardiogram has been performed.  Amber [name] 07/16/2020, 9:17 AM

## 2020-07-16 NOTE — Plan of Care (Signed)

## 2020-07-17 DIAGNOSIS — U071 COVID-19: Secondary | ICD-10-CM | POA: Diagnosis not present

## 2020-07-17 DIAGNOSIS — N179 Acute kidney failure, unspecified: Secondary | ICD-10-CM

## 2020-07-17 DIAGNOSIS — R55 Syncope and collapse: Secondary | ICD-10-CM | POA: Diagnosis not present

## 2020-07-17 LAB — BASIC METABOLIC PANEL
Anion gap: 8 (ref 5–15)
BUN: 18 mg/dL (ref 8–23)
CO2: 17 mmol/L — ABNORMAL LOW (ref 22–32)
Calcium: 8 mg/dL — ABNORMAL LOW (ref 8.9–10.3)
Chloride: 115 mmol/L — ABNORMAL HIGH (ref 98–111)
Creatinine, Ser: 0.89 mg/dL (ref 0.44–1.00)
GFR, Estimated: >60 mL/min (ref >60)
Glucose, Bld: 78 mg/dL (ref 70–99)
Potassium: 4.3 mmol/L (ref 3.5–5.1)
Sodium: 140 mmol/L (ref 135–145)

## 2020-07-17 LAB — GLUCOSE, CAPILLARY: Glucose-Capillary: 72 mg/dL (ref 70–99)

## 2020-07-17 LAB — TSH: TSH: 3.788 u[IU]/mL (ref 0.350–4.500)

## 2020-07-17 NOTE — Progress Notes (Signed)
RN gave discharge paperwork to [facility] and removed pt's IVs. Pt belongings with pt. [facility] transporting pt. RN called Durenda Age and gave report.

## 2020-07-17 NOTE — Social Work (Signed)
CSW was alerted that pt was medically ready for discharge today. CSW followed up with pt's daughter Beverlee Nims and confirmed the plan is for her to return to [place] spoke with [place] at [facility] that pt could return today. Marguarite Arbour asked if we could send the DC summary with pt. Pt will be transported by [facility].

## 2020-07-17 NOTE — Discharge Summary (Signed)
Physician Discharge Summary  Amber [name] ION:[id number] DOB: 09-25-1932 DOA: 07/15/2020  PCP: Heywood Bene, PA-C  Admit date: 07/15/2020 Discharge date: 07/17/2020  Admitted From: ALF Disposition:  ALF  Recommendations for Outpatient Follow-up:  1. Follow up with PCP in 1-2 weeks 2. Please obtain BMP/CBC in one week  Home Health: PT Equipment/Devices: none  Discharge Condition: stable CODE STATUS: DNR Diet recommendation: regular  HPI: Per admitting MD, Amber [name] is a 87 y.o. 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  Patient was apparently down for a period of time.  Was unresponsive for about 15 to 20 minutes.  She is weak.  Noted to be markedly hypotensive with systolic blood pressure in the 50s.  Was given a liter of normal saline at the time.  Blood pressure improved but when she came to the ER she was still hypotensive.  Suspicion for possible sepsis.  Patient has had poor oral intake apparently since her COVID-19 diagnosis.  She is out of the chest typical isolation..  In the ER patient was notably dehydrated with AKI and orthostatic.  She received 2 L of IVF fluids.  At this point her blood pressure has improved.  Patient however has symptoms of AKI and generalized weakness.  She has been admitted to the hospital for further evaluation and treatment.  Hospital Course / Discharge diagnoses: Principal problem Syncope-Most likely secondary to dehydration as she was profoundly hypotensive on admission.  She received IV fluids, her blood pressure normalized and now she is on the hypertensive side.  She is no longer orthostatic and to review echo showed an EF of 55  To 60%.  She has improved, returned to baseline, will be discharged home in stable condition  Active problems History of hypertension -hypotensive on admission but her blood pressure now is on the high side.  Resume antihypertensives Acute kidney injury -creatinine now has normalized with fluids.  Continue to encourage ongoing adequate hydration at the assisted living facility Hypothyroidism-Continue with levothyroxine Rrecent COVID-19 infection -Asymptomatic at this point.  Sepsis ruled out   Discharge Instructions   Allergies as of 07/17/2020   No Known Allergies     Medication List    TAKE these medications   acetaminophen 500 MG tablet Commonly known as: TYLENOL Take 500 mg by mouth 2 (two) times daily.   aspirin EC 81 MG tablet Take 81 mg by mouth daily.   benazepril 20 MG tablet Commonly known as: LOTENSIN Take 10 mg by mouth daily.   Fish Oil 1000 MG Caps Take 1,000 mg by mouth daily.   fluticasone 50 MCG/ACT nasal spray Commonly known as: FLONASE Place 1 spray into both nostrils daily as needed for allergies or rhinitis.   levothyroxine 50 MCG tablet Commonly known as: SYNTHROID Take 50 mcg by mouth daily before breakfast.   lovastatin 20 MG tablet Commonly known as: MEVACOR Take 20 mg by mouth at bedtime.   multivitamin with minerals Tabs tablet Take 1 tablet by mouth daily.   polyethylene glycol 17 g packet Commonly known as: MIRALAX / GLYCOLAX Take 17 g by mouth daily. What changed:   when to take this  reasons to take this   senna 8.6 MG Tabs tablet Commonly known as:  SENOKOT Take 1 tablet by mouth daily as needed for mild constipation.   senna-docusate 8.6-50 MG tablet Commonly known as: Senokot-S Take 1 tablet by mouth 2 (two) times daily.   vitamin B-12 1000 MCG tablet Commonly known as: CYANOCOBALAMIN Take 1,000 mcg by mouth daily.      Consultations:  None   Procedures/Studies:  CT Head Wo Contrast  Result Date:  07/15/2020 CLINICAL DATA:  Fall with head trauma, syncope EXAM: CT HEAD WITHOUT CONTRAST TECHNIQUE: Contiguous axial images were obtained from the base of the skull through the vertex without intravenous contrast. COMPARISON:  07/05/2020 FINDINGS: Brain: No evidence of acute infarction, hemorrhage, hydrocephalus, extra-axial collection or mass lesion/mass effect. Moderate low-density changes within the periventricular and subcortical white matter compatible with chronic microvascular ischemic change. Mild diffuse cerebral volume loss. Vascular: Atherosclerotic calcifications involving the large vessels of the skull base. No unexpected hyperdense vessel. Skull: Normal. Negative for fracture or focal lesion. Sinuses/Orbits: Left maxillary mucosal thickening. Probable right maxillary mucous retention cyst. Mastoid air cells are clear. Orbital structures within normal limits. Other: Negative for scalp hematoma. IMPRESSION: 1. No acute intracranial findings. 2. Chronic microvascular ischemic change and cerebral volume loss. Electronically Signed   By: Davina Poke D.O.   On: 07/15/2020 14:58   CT Head Wo Contrast  Result Date: 07/05/2020 CLINICAL DATA:  Recent falls. EXAM: CT HEAD WITHOUT CONTRAST CT CERVICAL SPINE WITHOUT CONTRAST TECHNIQUE: Multidetector CT imaging of the head and cervical spine was performed following the standard protocol without intravenous contrast. Multiplanar CT image reconstructions of the cervical spine were also generated. COMPARISON:  12/11/2019 FINDINGS: CT HEAD FINDINGS Brain: No evidence of acute infarction, hemorrhage, hydrocephalus, extra-axial collection or mass lesion/mass effect. There is mild diffuse low-attenuation within the subcortical and periventricular white matter compatible with chronic microvascular disease. Prominence of the sulci and ventricles compatible with brain atrophy. Vascular: No hyperdense vessel or unexpected calcification. Skull: Normal. Negative for  fracture or focal lesion. Sinuses/Orbits: Mastoid air cells are clear. Fluid levels identified within the maxillary sinuses. Bilateral maxillary sinus and ethmoid air cell mucosal thickening. Other: None. CT CERVICAL SPINE FINDINGS Alignment: Normal. Skull base and vertebrae: No acute fracture. No primary bone lesion or focal pathologic process. Soft tissues and spinal canal: No prevertebral fluid or swelling. No visible canal hematoma. Disc levels: Mild multilevel disc space narrowing and endplate spurring noted. Most advanced at C5-6. Upper chest: Centrilobular and paraseptal emphysema. Within the left upper lobe there is a part solid nodule measuring 1.6 x 1.5 cm, image 80/4. This has a solid component which measures 1 cm. Other: None IMPRESSION: 1. No acute intracranial abnormalities. 2. Chronic small vessel ischemic change and brain atrophy. 3. No evidence for cervical spine fracture or dislocation. 4. Cervical spondylosis. 5. Part solid nodule within the left upper lobe is identified measuring up to 1.6 cm with a solid component measuring 1 cm. Further evaluation with dedicated unenhanced CT of the chest. Emphysema (ICD10-J43.9). Electronically Signed   By: Kerby Moors M.D.   On: 07/05/2020 11:00   CT Cervical Spine Wo Contrast  Result Date: 07/05/2020 CLINICAL DATA:  Recent falls. EXAM: CT HEAD WITHOUT CONTRAST CT CERVICAL SPINE WITHOUT CONTRAST TECHNIQUE: Multidetector CT imaging of the head and cervical spine was performed following the standard protocol without intravenous contrast. Multiplanar CT image reconstructions of the cervical spine were also generated. COMPARISON:  12/11/2019 FINDINGS: CT HEAD FINDINGS Brain: No evidence of acute infarction, hemorrhage, hydrocephalus, extra-axial collection or mass lesion/mass effect. There is mild  diffuse low-attenuation within the subcortical and periventricular white matter compatible with chronic microvascular disease. Prominence of the sulci and  ventricles compatible with brain atrophy. Vascular: No hyperdense vessel or unexpected calcification. Skull: Normal. Negative for fracture or focal lesion. Sinuses/Orbits: Mastoid air cells are clear. Fluid levels identified within the maxillary sinuses. Bilateral maxillary sinus and ethmoid air cell mucosal thickening. Other: None. CT CERVICAL SPINE FINDINGS Alignment: Normal. Skull base and vertebrae: No acute fracture. No primary bone lesion or focal pathologic process. Soft tissues and spinal canal: No prevertebral fluid or swelling. No visible canal hematoma. Disc levels: Mild multilevel disc space narrowing and endplate spurring noted. Most advanced at C5-6. Upper chest: Centrilobular and paraseptal emphysema. Within the left upper lobe there is a part solid nodule measuring 1.6 x 1.5 cm, image 80/4. This has a solid component which measures 1 cm. Other: None IMPRESSION: 1. No acute intracranial abnormalities. 2. Chronic small vessel ischemic change and brain atrophy. 3. No evidence for cervical spine fracture or dislocation. 4. Cervical spondylosis. 5. Part solid nodule within the left upper lobe is identified measuring up to 1.6 cm with a solid component measuring 1 cm. Further evaluation with dedicated unenhanced CT of the chest. Emphysema (ICD10-J43.9). Electronically Signed   By: Kerby Moors M.D.   On: 07/05/2020 11:00   DG Chest Port 1 View  Result Date: 07/15/2020 CLINICAL DATA:  Weakness and syncope EXAM: PORTABLE CHEST 1 VIEW COMPARISON:  Jul 05, 2020 FINDINGS: There is minimal scarring in the right base. There is an apparent small granuloma in the left upper lobe toward the apex. Lungs elsewhere clear. Heart size and pulmonary vascularity are normal. No adenopathy. There is aortic atherosclerosis. No bone lesions. IMPRESSION: Mild scarring right base. Small granuloma left upper lobe. No edema or airspace opacity. Heart size normal. Aortic Atherosclerosis (ICD10-I70.0). Electronically Signed    By: Lowella Grip III M.D.   On: 07/15/2020 14:11   DG Chest Portable 1 View  Result Date: 07/05/2020 CLINICAL DATA:  Cough and weakness.  Status post fall EXAM: PORTABLE CHEST 1 VIEW COMPARISON:  10/23/2019 FINDINGS: Heart size and mediastinal contours are normal. No pleural effusion or edema identified. Scar versus platelike atelectasis is noted in the left base. No airspace disease. IMPRESSION: Scar versus platelike atelectasis in the left base. Electronically Signed   By: Kerby Moors M.D.   On: 07/05/2020 10:17   ECHOCARDIOGRAM COMPLETE  Result Date: 07/16/2020    ECHOCARDIOGRAM REPORT   Patient Name:   Amber [name] Date of Exam: 07/16/2020 Medical Rec #:  [id number]      Height:       68.0 in Accession #:    [id number]     Weight:       142.4 lb Date of Birth:  [birth year]      BSA:          1.769 m Patient Age:    53 years       BP:           120/51 mmHg Patient Gender: F              HR:           63 bpm. Exam Location:  Inpatient Procedure: 2D Echo, Color Doppler and Cardiac Doppler Indications:     R55 Syncope  History:         Patient has no prior history of Echocardiogram examinations.  Risk Factors:Hypertension and Dyslipidemia. COVID+ at time of                  study.  Sonographer:     Raquel Sarna Senior RDCS Referring Phys:  [place] Diagnosing Phys: Gwyndolyn Kaufman MD  Sonographer Comments: Technically difficult due to lung interference and patient immobility. IMPRESSIONS  1. Limited study due to poor acoustic windows. Left ventricular ejection fraction, by estimation, is 55 to 60%. The left ventricle has normal function. Left ventricular endocardial border not optimally defined to evaluate regional wall motion, however, appears grossly normal. Left ventricular diastolic parameters are consistent with Grade I diastolic dysfunction (impaired relaxation).  2. Right ventricular systolic function is normal. The right ventricular size is normal. There is normal  pulmonary artery systolic pressure. The estimated right ventricular systolic pressure is 99.3 mmHg.  3. Left atrial size was mildly dilated.  4. The mitral valve is grossly normal. Trivial mitral valve regurgitation.  5. The aortic valve was not well visualized. Aortic valve regurgitation is not visualized. No aortic stenosis is present.  6. The inferior vena cava is normal in size with greater than 50% respiratory variability, suggesting right atrial pressure of 3 mmHg. Comparison(s): No prior Echocardiogram. FINDINGS  Left Ventricle: Limited study due to poor acoustic windows. Left ventricular ejection fraction, by estimation, is 55 to 60%. The left ventricle has normal function. Left ventricular endocardial border not optimally defined to evaluate regional wall motion. The left ventricular internal cavity size was normal in size. There is no left ventricular hypertrophy. Left ventricular diastolic parameters are consistent with Grade I diastolic dysfunction (impaired relaxation). Right Ventricle: The right ventricular size is normal. No increase in right ventricular wall thickness. Right ventricular systolic function is normal. There is normal pulmonary artery systolic pressure. The tricuspid regurgitant velocity is 2.66 m/s, and  with an assumed right atrial pressure of 3 mmHg, the estimated right ventricular systolic pressure is 71.6 mmHg. Left [facility]: Left atrial size was mildly dilated. Right [facility]: Right atrial size was normal in size. Pericardium: There is no evidence of pericardial effusion. Presence of pericardial fat pad. Mitral Valve: The mitral valve is grossly normal. There is mild thickening of the mitral valve leaflet(s). There is mild calcification of the mitral valve leaflet(s). Mild mitral annular calcification. Trivial mitral valve regurgitation. Tricuspid Valve: The tricuspid valve is normal in structure. Tricuspid valve regurgitation is trivial. Aortic Valve: The aortic valve was not well  visualized. Aortic valve regurgitation is not visualized. No aortic stenosis is present. Pulmonic Valve: The pulmonic valve was not well visualized. Pulmonic valve regurgitation is not visualized. Aorta: The aortic root is normal in size and structure. Venous: The inferior vena cava is normal in size with greater than 50% respiratory variability, suggesting right atrial pressure of 3 mmHg. IAS/Shunts: No atrial level shunt detected by color flow Doppler.  LEFT VENTRICLE PLAX 2D LVIDd:         4.30 cm  Diastology LVIDs:         3.00 cm  LV e' medial:    6.42 cm/s LV PW:         0.90 cm  LV E/e' medial:  10.3 LV IVS:        0.80 cm  LV e' lateral:   7.18 cm/s LVOT diam:     1.80 cm  LV E/e' lateral: 9.2 LV SV:         54 LV SV Index:   31 LVOT Area:  2.54 cm  RIGHT VENTRICLE RV S prime:     11.10 cm/s TAPSE (M-mode): 1.7 cm LEFT ATRIUM             Index       RIGHT ATRIUM           Index LA diam:        4.10 cm 2.32 cm/m  RA Area:     13.10 cm LA Vol (A2C):   49.0 ml 27.70 ml/m RA Volume:   29.40 ml  16.62 ml/m LA Vol (A4C):   60.8 ml 34.37 ml/m LA Biplane Vol: 58.2 ml 32.90 ml/m  AORTIC VALVE LVOT Vmax:   92.00 cm/s LVOT Vmean:  63.700 cm/s LVOT VTI:    0.213 m  AORTA Ao Root diam: 2.70 cm MITRAL VALVE               TRICUSPID VALVE MV Area (PHT): 2.34 cm    TR Peak grad:   28.3 mmHg MV Decel Time: 324 msec    TR Vmax:        266.00 cm/s MV E velocity: 66.40 cm/s MV A velocity: 76.30 cm/s  SHUNTS MV E/A ratio:  0.87        Systemic VTI:  0.21 m                            Systemic Diam: 1.80 cm Gwyndolyn Kaufman MD Electronically signed by Gwyndolyn Kaufman MD Signature Date/Time: 07/16/2020/11:24:09 AM    Final (Updated)       Subjective: - no chest pain, shortness of breath, no abdominal pain, nausea or vomiting.   Discharge Exam: BP (!) 154/66 (BP Location: Right Arm)   Pulse 65   Temp 97.6 F (36.4 C) (Oral)   Resp 16   Ht 5\' 8"  (1.727 m)   Wt 66.9 kg   SpO2 100%   BMI 22.43 kg/m    General: Pt is alert, awake, not in acute distress Cardiovascular: RRR, S1/S2 +, no rubs, no gallops Respiratory: CTA bilaterally, no wheezing, no rhonchi Abdominal: Soft, NT, ND, bowel sounds + Extremities: no edema, no cyanosis   The results of significant diagnostics from this hospitalization (including imaging, microbiology, ancillary and laboratory) are listed below for reference.     Microbiology: Recent Results (from the past 240 hour(s))  MRSA PCR Screening     Status: None   Collection Time: 07/16/20 12:14 AM   Specimen: Nasal Mucosa; Nasopharyngeal  Result Value Ref Range Status   MRSA by PCR NEGATIVE NEGATIVE Final    Comment:        The GeneXpert MRSA Assay (FDA approved for NASAL specimens only), is one component of a comprehensive MRSA colonization surveillance program. It is not intended to diagnose MRSA infection nor to guide or monitor treatment for MRSA infections. Performed at [facility], [place] [address], [place], [place] [address]      Labs: Basic Metabolic Panel: Recent Labs  Lab 07/15/20 1425 07/16/20 0046 07/17/20 0037  NA 136 139 140  K 4.1 4.2 4.3  CL 105 109 115*  CO2 21* 19* 17*  GLUCOSE 137* 85 78  BUN 32* 35* 18  CREATININE 1.79* 1.41* 0.89  CALCIUM 8.7* 8.2* 8.0*   Liver Function Tests: Recent Labs  Lab 07/15/20 1425 07/16/20 0046  AST 30 26  ALT 32 27  ALKPHOS 100 90  BILITOT 0.8 0.8  PROT 7.8 7.0  ALBUMIN 3.2* 2.8*  CBC: Recent Labs  Lab 07/15/20 1425 07/16/20 0046  WBC 19.1* 10.4  HGB 13.1 13.0  HCT 41.9 39.9  MCV 91.9 89.7  PLT 299 241   CBG: Recent Labs  Lab 07/15/20 1333 07/17/20 0611  GLUCAP 171* 72   Hgb A1c No results for input(s): HGBA1C in the last 72 hours. Lipid Profile No results for input(s): CHOL, HDL, LDLCALC, TRIG, CHOLHDL, LDLDIRECT in the last 72 hours. Thyroid function studies Recent Labs    07/17/20 0037  TSH 3.788   Urinalysis    Component Value Date/Time    COLORURINE AMBER (A) 07/15/2020 1833   APPEARANCEUR CLOUDY (A) 07/15/2020 1833   LABSPEC 1.021 07/15/2020 1833   PHURINE 5.0 07/15/2020 1833   GLUCOSEU NEGATIVE 07/15/2020 1833   HGBUR NEGATIVE 07/15/2020 1833   BILIRUBINUR NEGATIVE 07/15/2020 1833   KETONESUR 5 (A) 07/15/2020 1833   PROTEINUR 30 (A) 07/15/2020 1833   UROBILINOGEN 1.0 03/26/2013 1537   NITRITE NEGATIVE 07/15/2020 1833   LEUKOCYTESUR SMALL (A) 07/15/2020 1833    FURTHER DISCHARGE INSTRUCTIONS:   Get Medicines reviewed and adjusted: Please take all your medications with you for your next visit with your Primary MD   Laboratory/radiological data: Please request your Primary MD to go over all hospital tests and procedure/radiological results at the follow up, please ask your Primary MD to get all Hospital records sent to his/her office.   In some cases, they will be blood work, cultures and biopsy results pending at the time of your discharge. Please request that your primary care M.D. goes through all the records of your hospital data and follows up on these results.   Also Note the following: If you experience worsening of your admission symptoms, develop shortness of breath, life threatening emergency, suicidal or homicidal thoughts you must seek medical attention immediately by calling 911 or calling your MD immediately  if symptoms less severe.   You must read complete instructions/literature along with all the possible adverse reactions/side effects for all the Medicines you take and that have been prescribed to you. Take any new Medicines after you have completely understood and accpet all the possible adverse reactions/side effects.    Do not drive when taking Pain medications or sleeping medications (Benzodaizepines)   Do not take more than prescribed Pain, Sleep and Anxiety Medications. It is not advisable to combine anxiety,sleep and pain medications without talking with your primary care practitioner    Special Instructions: If you have smoked or chewed Tobacco  in the last 2 yrs please stop smoking, stop any regular Alcohol  and or any Recreational drug use.   Wear Seat belts while driving.   Please note: You were cared for by a hospitalist during your hospital stay. Once you are discharged, your primary care physician will handle any further medical issues. Please note that NO REFILLS for any discharge medications will be authorized once you are discharged, as it is imperative that you return to your primary care physician (or establish a relationship with a primary care physician if you do not have one) for your post hospital discharge needs so that they can reassess your need for medications and monitor your lab values.  Time coordinating discharge: 40 minutes  SIGNED:  Marzetta Board, MD, PhD 07/17/2020, 10:09 AM

## 2020-07-17 NOTE — TOC Transition Note (Signed)
Transition of Care [facility]) - CM/SW Discharge Note   Patient Details  Name: Amber [name] MRN: [id number] Date of Birth: 12-Mar-1932  [facility] CM/SW Contact:  Bary Castilla, LCSW Phone Number: [phone] 07/17/2020, 10:27 AM   Clinical Narrative:    Patient will DC to:?[facility] Anticipated DC date:?07/17/2020 Family notified:?[facility] Transport by: Corey Harold   Per MD patient ready for DC to [facility]. RN, patient, patient's family, and facility notified of DC. Discharge Summary sent to facility. RN given number for report  [phone].     . DC packet on chart. Ambulance transport requested for patient.   CSW signing off.   [place], [place] [phone]          Patient Goals and CMS Choice        Discharge Placement                       Discharge Plan and Services                                     Social Determinants of Health (SDOH) Interventions     Readmission Risk Interventions No flowsheet data found.

## 2020-08-31 ENCOUNTER — Emergency Department (HOSPITAL_COMMUNITY): Payer: Medicare Other

## 2020-08-31 ENCOUNTER — Inpatient Hospital Stay (HOSPITAL_BASED_OUTPATIENT_CLINIC_OR_DEPARTMENT_OTHER)
Admission: EM | Admit: 2020-08-31 | Discharge: 2020-09-03 | DRG: 303 | Disposition: A | Discharge status: Skilled Nursing Facility | Payer: Medicare Other | Source: Skilled Nursing Facility | Attending: Internal Medicine | Admitting: Internal Medicine

## 2020-08-31 ENCOUNTER — Encounter (HOSPITAL_BASED_OUTPATIENT_CLINIC_OR_DEPARTMENT_OTHER): Payer: Self-pay

## 2020-08-31 ENCOUNTER — Other Ambulatory Visit: Payer: Self-pay

## 2020-08-31 ENCOUNTER — Emergency Department (HOSPITAL_BASED_OUTPATIENT_CLINIC_OR_DEPARTMENT_OTHER): Payer: Medicare Other

## 2020-08-31 DIAGNOSIS — Z7982 Long term (current) use of aspirin: Secondary | ICD-10-CM

## 2020-08-31 DIAGNOSIS — Z9049 Acquired absence of other specified parts of digestive tract: Secondary | ICD-10-CM | POA: Diagnosis not present

## 2020-08-31 DIAGNOSIS — Z79890 Hormone replacement therapy (postmenopausal): Secondary | ICD-10-CM

## 2020-08-31 DIAGNOSIS — D509 Iron deficiency anemia, unspecified: Secondary | ICD-10-CM | POA: Diagnosis present

## 2020-08-31 DIAGNOSIS — Z20822 Contact with and (suspected) exposure to covid-19: Secondary | ICD-10-CM | POA: Diagnosis present

## 2020-08-31 DIAGNOSIS — Z9221 Personal history of antineoplastic chemotherapy: Secondary | ICD-10-CM | POA: Diagnosis not present

## 2020-08-31 DIAGNOSIS — Z79899 Other long term (current) drug therapy: Secondary | ICD-10-CM

## 2020-08-31 DIAGNOSIS — I998 Other disorder of circulatory system: Principal | ICD-10-CM | POA: Diagnosis present

## 2020-08-31 DIAGNOSIS — I739 Peripheral vascular disease, unspecified: Secondary | ICD-10-CM

## 2020-08-31 DIAGNOSIS — Z66 Do not resuscitate: Secondary | ICD-10-CM | POA: Diagnosis present

## 2020-08-31 DIAGNOSIS — Z0000 Encounter for general adult medical examination without abnormal findings: Secondary | ICD-10-CM | POA: Diagnosis not present

## 2020-08-31 DIAGNOSIS — Z96643 Presence of artificial hip joint, bilateral: Secondary | ICD-10-CM | POA: Diagnosis present

## 2020-08-31 DIAGNOSIS — E538 Deficiency of other specified B group vitamins: Secondary | ICD-10-CM | POA: Diagnosis present

## 2020-08-31 DIAGNOSIS — I10 Essential (primary) hypertension: Secondary | ICD-10-CM | POA: Diagnosis present

## 2020-08-31 DIAGNOSIS — Z8616 Personal history of COVID-19: Secondary | ICD-10-CM | POA: Diagnosis not present

## 2020-08-31 DIAGNOSIS — Z87891 Personal history of nicotine dependence: Secondary | ICD-10-CM

## 2020-08-31 DIAGNOSIS — D649 Anemia, unspecified: Secondary | ICD-10-CM | POA: Diagnosis not present

## 2020-08-31 DIAGNOSIS — R0989 Other specified symptoms and signs involving the circulatory and respiratory systems: Secondary | ICD-10-CM

## 2020-08-31 DIAGNOSIS — Z5320 Procedure and treatment not carried out because of patient's decision for unspecified reasons: Secondary | ICD-10-CM | POA: Diagnosis present

## 2020-08-31 DIAGNOSIS — Z85831 Personal history of malignant neoplasm of soft tissue: Secondary | ICD-10-CM

## 2020-08-31 DIAGNOSIS — E785 Hyperlipidemia, unspecified: Secondary | ICD-10-CM | POA: Diagnosis present

## 2020-08-31 DIAGNOSIS — M79605 Pain in left leg: Secondary | ICD-10-CM | POA: Diagnosis present

## 2020-08-31 DIAGNOSIS — E039 Hypothyroidism, unspecified: Secondary | ICD-10-CM | POA: Diagnosis present

## 2020-08-31 DIAGNOSIS — M79672 Pain in left foot: Secondary | ICD-10-CM

## 2020-08-31 DIAGNOSIS — Z90710 Acquired absence of both cervix and uterus: Secondary | ICD-10-CM

## 2020-08-31 LAB — CBC WITH DIFFERENTIAL/PLATELET
Abs Immature Granulocytes: 0 10*3/uL (ref 0.00–0.07)
Basophils Absolute: 0 10*3/uL (ref 0.0–0.1)
Basophils Relative: 1 %
Eosinophils Absolute: 0.1 10*3/uL (ref 0.0–0.5)
Eosinophils Relative: 3 %
HCT: 32.1 % — ABNORMAL LOW (ref 36.0–46.0)
Hemoglobin: 10.2 g/dL — ABNORMAL LOW (ref 12.0–15.0)
Immature Granulocytes: 0 %
Lymphocytes Relative: 28 %
Lymphs Abs: 1.3 10*3/uL (ref 0.7–4.0)
MCH: 28.7 pg (ref 26.0–34.0)
MCHC: 31.8 g/dL (ref 30.0–36.0)
MCV: 90.4 fL (ref 80.0–100.0)
Monocytes Absolute: 0.5 10*3/uL (ref 0.1–1.0)
Monocytes Relative: 10 %
Neutro Abs: 2.8 10*3/uL (ref 1.7–7.7)
Neutrophils Relative %: 58 %
Platelets: 184 10*3/uL (ref 150–400)
RBC: 3.55 MIL/uL — ABNORMAL LOW (ref 3.87–5.11)
RDW: 13.9 % (ref 11.5–15.5)
WBC: 4.7 10*3/uL (ref 4.0–10.5)
nRBC: 0 % (ref 0.0–0.2)

## 2020-08-31 LAB — BASIC METABOLIC PANEL
Anion gap: 6 (ref 5–15)
BUN: 13 mg/dL (ref 8–23)
CO2: 27 mmol/L (ref 22–32)
Calcium: 8.4 mg/dL — ABNORMAL LOW (ref 8.9–10.3)
Chloride: 109 mmol/L (ref 98–111)
Creatinine, Ser: 1.03 mg/dL — ABNORMAL HIGH (ref 0.44–1.00)
GFR, Estimated: 53 mL/min — ABNORMAL LOW (ref >60)
Glucose, Bld: 90 mg/dL (ref 70–99)
Potassium: 4.2 mmol/L (ref 3.5–5.1)
Sodium: 142 mmol/L (ref 135–145)

## 2020-08-31 LAB — FERRITIN: Ferritin: 30 ng/mL (ref 11–307)

## 2020-08-31 LAB — IRON AND TIBC
Iron: 47 ug/dL (ref 28–170)
Saturation Ratios: 15 % (ref 10.4–31.8)
TIBC: 308 ug/dL (ref 250–450)
UIBC: 261 ug/dL

## 2020-08-31 LAB — PROTIME-INR
INR: 1.1 (ref 0.8–1.2)
Prothrombin Time: 14.1 seconds (ref 11.4–15.2)

## 2020-08-31 LAB — HEPARIN LEVEL (UNFRACTIONATED): Heparin Unfractionated: 0.33 IU/mL (ref 0.30–0.70)

## 2020-08-31 LAB — RESP PANEL BY RT-PCR (FLU A&B, COVID) ARPGX2
Influenza A by PCR: NEGATIVE
Influenza B by PCR: NEGATIVE
SARS Coronavirus 2 by RT PCR: NEGATIVE

## 2020-08-31 LAB — LIPID PANEL
Cholesterol: 160 mg/dL (ref 0–200)
HDL: 52 mg/dL (ref >40)
LDL Cholesterol: 98 mg/dL (ref 0–99)
Total CHOL/HDL Ratio: 3.1 RATIO
Triglycerides: 49 mg/dL (ref <150)
VLDL: 10 mg/dL (ref 0–40)

## 2020-08-31 LAB — FOLATE: Folate: 9 ng/mL (ref >5.9)

## 2020-08-31 LAB — APTT: aPTT: 36 seconds (ref 24–36)

## 2020-08-31 LAB — HEMOGLOBIN A1C
Hgb A1c MFr Bld: 5.1 % (ref 4.8–5.6)
Mean Plasma Glucose: 99.67 mg/dL

## 2020-08-31 LAB — VITAMIN B12: Vitamin B-12: 554 pg/mL (ref 180–914)

## 2020-08-31 MED ORDER — RAMELTEON 8 MG PO TABS
8.0000 mg | ORAL_TABLET | Freq: Once | ORAL | Status: AC
  Administered 2020-08-31: 8 mg via ORAL
  Filled 2020-08-31: qty 1

## 2020-08-31 MED ORDER — LEVOTHYROXINE SODIUM 50 MCG PO TABS
50.0000 ug | ORAL_TABLET | Freq: Every day | ORAL | Status: DC
  Administered 2020-09-01 – 2020-09-03 (×2): 50 ug via ORAL
  Filled 2020-08-31 (×2): qty 1

## 2020-08-31 MED ORDER — BENAZEPRIL HCL 20 MG PO TABS
10.0000 mg | ORAL_TABLET | Freq: Every day | ORAL | Status: DC
  Administered 2020-09-01 – 2020-09-03 (×3): 10 mg via ORAL
  Filled 2020-08-31 (×3): qty 1

## 2020-08-31 MED ORDER — ACETAMINOPHEN 650 MG RE SUPP: 650.0000 mg | Freq: Four times a day (QID) | RECTAL | Status: DC | PRN

## 2020-08-31 MED ORDER — SENNOSIDES-DOCUSATE SODIUM 8.6-50 MG PO TABS: 1.0000 | ORAL_TABLET | Freq: Every evening | ORAL | Status: DC | PRN

## 2020-08-31 MED ORDER — HEPARIN BOLUS VIA INFUSION
3000.0000 [IU] | Freq: Once | INTRAVENOUS | Status: AC
  Administered 2020-08-31: 3000 [IU] via INTRAVENOUS
  Filled 2020-08-31: qty 3000

## 2020-08-31 MED ORDER — ACETAMINOPHEN 325 MG PO TABS: 650.0000 mg | ORAL_TABLET | Freq: Four times a day (QID) | ORAL | Status: DC | PRN

## 2020-08-31 MED ORDER — HYDROMORPHONE HCL 1 MG/ML IJ SOLN
0.5000 mg | INTRAMUSCULAR | Status: DC | PRN
Start: 2020-08-31 — End: 2020-09-04
  Administered 2020-08-31 – 2020-09-01 (×3): 0.5 mg via INTRAVENOUS
  Filled 2020-08-31 (×3): qty 0.5

## 2020-08-31 MED ORDER — ADULT MULTIVITAMIN W/MINERALS CH
1.0000 | ORAL_TABLET | Freq: Every day | ORAL | Status: DC
  Administered 2020-09-01 – 2020-09-03 (×3): 1 via ORAL
  Filled 2020-08-31 (×3): qty 1

## 2020-08-31 MED ORDER — PRAVASTATIN SODIUM 40 MG PO TABS
20.0000 mg | ORAL_TABLET | Freq: Every day | ORAL | Status: DC
  Administered 2020-09-01 – 2020-09-03 (×3): 20 mg via ORAL
  Filled 2020-08-31 (×3): qty 1

## 2020-08-31 MED ORDER — HEPARIN (PORCINE) 25000 UT/250ML-% IV SOLN
1300.0000 [IU]/h | INTRAVENOUS | Status: DC
  Administered 2020-08-31: 1150 [IU]/h via INTRAVENOUS
  Administered 2020-09-02: 1300 [IU]/h via INTRAVENOUS
  Filled 2020-08-31 (×4): qty 250

## 2020-08-31 MED ORDER — VITAMIN B-12 1000 MCG PO TABS
1000.0000 ug | ORAL_TABLET | Freq: Every day | ORAL | Status: DC
  Administered 2020-09-01 – 2020-09-03 (×3): 1000 ug via ORAL
  Filled 2020-08-31 (×3): qty 1

## 2020-08-31 MED ORDER — HYDROCODONE-ACETAMINOPHEN 5-325 MG PO TABS
1.0000 | ORAL_TABLET | Freq: Four times a day (QID) | ORAL | Status: DC | PRN
  Administered 2020-09-01 – 2020-09-03 (×2): 1 via ORAL
  Filled 2020-08-31 (×2): qty 1

## 2020-08-31 NOTE — ED Notes (Signed)
Attempted to give report to floor

## 2020-08-31 NOTE — ED Triage Notes (Signed)
Patient BIB [facility] from [facility] ALF for Leg Pain.  Patient states she has been having Pain to Left Leg for "30 something" years and Facility Staff stated it has been worsening over the past few days.   LLE is Slightly Swollen and Discolored.   A&Ox4. GCS 15.

## 2020-08-31 NOTE — Progress Notes (Signed)
ANTICOAGULATION CONSULT NOTE - Initial Consult  Pharmacy Consult for heparin Indication:  Limb ischemia  No Known Allergies  Patient Measurements: Height: 5\' 8"  (172.7 cm) Weight: 67.9 kg (149 lb 11.1 oz) IBW/kg (Calculated) : 63.9 Heparin Dosing Weight: 67.9 kg  Vital Signs: Temp: 98.2 F (36.8 C) (07/19 0725) Temp Source: Oral (07/19 0725) BP: 169/73 (07/19 0725) Pulse Rate: 71 (07/19 0725)  Labs: Recent Labs    08/31/20 0642  HGB 10.2*  HCT 32.1*  PLT 184  LABPROT 14.1  INR 1.1  CREATININE 1.03*    Estimated Creatinine Clearance: 39.5 mL/min (A) (by C-G formula based on SCr of 1.03 mg/dL (H)).  Assessment: 87 yo F from ALF for leg pain - LLE without pulses on doppler. Transfer to [facility] ER. No AC PTA.  Goal of Therapy:  Heparin level 0.3-0.7 units/ml Monitor platelets by anticoagulation protocol: Yes   Plan: Dosing on conservative side given age and renal function Give 3000 units bolus x 1 Start heparin infusion at 1150 units/hr Check anti-Xa level in 8 hours and daily while on heparin Continue to monitor H&H and platelets  Joetta Manners, PharmD, [facility] Emergency Medicine Clinical Pharmacist ED RPh Phone: [place]: [phone]

## 2020-08-31 NOTE — ED Provider Notes (Addendum)
7:59 AM Patient arrived from [place].   From [facility]F. She uses wheelchair at baseline.   Patient states that she has had left heel pain over the past week.  She had decreased pulses in his foot noted this morning, prompting transfer to [facility] ED for vascular evaluation.  Patient has dopplerable DP and posterior tibialis pulse in right foot.  She has dopplerable posterior and anterior tibialis pulses in left foot.  Unable to Doppler DP pulse in left foot.  Seen at bedside with Dr. Scot Dock.  Requests admission to hospital service.  Current symptoms are chronic and she will likely need angiogram.  In the interim, requests heparin, arterial duplex.  Patient aware of plan.  BP (!) 169/73 (BP Location: Right Arm)   Pulse 71   Temp 98.2 F (36.8 C) (Oral)   Resp 18   Ht 5\' 8"  (1.727 m)   Wt 67.9 kg   SpO2 99%   BMI 22.76 kg/m   Will request unassigned admission (PCP in [facility] system).   8:09 AM IMTS to see patient.       Carlisle Cater, PA-C 08/31/20 1610    Sherwood Gambler, MD 08/31/20 1745

## 2020-08-31 NOTE — ED Provider Notes (Signed)
[facility] EMERGENCY DEPT Provider Note   CSN: [id number] Arrival date & time: 08/31/20  0606     History Chief Complaint  Patient presents with   Leg Pain    Left Lower    Amber [name] is a 87 y.o. female.  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  Patient reports having had pain and problems with her leg since.  Over the past few days, patient has been having increased pain to her left foot.  She denies to me she has experienced any injury or trauma.  She denies any chest pain or difficulty breathing.  She denies any fevers.  The history is provided by the patient.  Leg Pain Location:  Foot Foot location:  L foot Pain details:    Quality:  Aching   Radiates to:  Does not radiate   Severity:  Moderate   Onset quality:  Gradual   Duration:  3 days   Timing:  Constant   Progression:  Worsening Chronicity:  New Relieved by:  Nothing Worsened by:  Nothing Ineffective treatments:  None tried     Past Medical History:  Diagnosis Date   Complication of anesthesia    "woke up jumping and bouncing"   COVID-19    Hyperlipidemia    Hypertension    sarcoma of lt leg dx'd 1989   xrt and chemo and surg   Thyroid disease     Patient Active Problem List   Diagnosis Date Noted   Syncope and collapse 07/15/2020   hx of COVID-19 07/15/2020   AKI (acute kidney injury) ([place]) 07/15/2020   Proximal Femur Fracture, left /Mechanical Fall 10/19/2017   Femur fracture ([place]) 10/19/2017   Dizziness 01/19/2016   Fall 01/19/2016   Closed right hip fracture ([place]) 01/19/2016   Hypertension 01/19/2016   Hypothyroidism 01/19/2016   Hyperlipidemia 01/19/2016   Hip fracture ([place]) 01/19/2016   Status post laparoscopic cholecystectomy 03/27/2013    Past Surgical History:  Procedure Laterality Date    ABDOMINAL HYSTERECTOMY     CHOLECYSTECTOMY N/A 03/27/2013   Procedure: LAPAROSCOPIC CHOLECYSTECTOMY WITH INTRAOPERATIVE CHOLANGIOGRAM;  Surgeon: Pedro Earls, MD;  Location: [facility];  Service: General;  Laterality: N/A;   HIP ARTHROPLASTY Right 01/20/2016   Procedure: ARTHROPLASTY BIPOLAR HIP (HEMIARTHROPLASTY);  Surgeon: Renette Butters, MD;  Location: [facility];  Service: Orthopedics;  Laterality: Right;   HIP ARTHROPLASTY Left 10/20/2017   Procedure: ARTHROPLASTY BIPOLAR HIP (HEMIARTHROPLASTY);  Surgeon: Hiram Gash, MD;  Location: [facility];  Service: Orthopedics;  Laterality: Left;   KNEE SURGERY Left    approx 4 surgeries on left knee     OB History   No obstetric history on file.     Family History  Problem Relation Age of Onset   Lung cancer Sister    Bone cancer Brother    Stomach cancer Sister     Social History   Tobacco Use   Smoking status: Former    Types: Cigarettes    Quit date: 03/27/1983    Years since quitting: 37.4   Smokeless tobacco: Never  Vaping Use   Vaping Use: Never used  Substance Use Topics   Alcohol use: No   Drug use: No    Home Medications Prior to Admission medications   Medication Sig Start Date End Date Taking? Authorizing Provider  acetaminophen (  TYLENOL) 500 MG tablet Take 500 mg by mouth 2 (two) times daily.    [provider]  aspirin EC 81 MG tablet Take 81 mg by mouth daily.    [provider]  benazepril (LOTENSIN) 20 MG tablet Take 10 mg by mouth daily.    [provider]  fluticasone (FLONASE) 50 MCG/ACT nasal spray Place 1 spray into both nostrils daily as needed for allergies or rhinitis.    [provider]  levothyroxine (SYNTHROID) 50 MCG tablet Take 50 mcg by mouth daily before breakfast.    [provider]  lovastatin (MEVACOR) 20 MG tablet Take 20 mg by mouth at bedtime. 11/17/15   [provider]  Multiple Vitamin (MULTIVITAMIN WITH MINERALS) TABS tablet Take 1 tablet by  mouth daily.    [provider]  Omega-3 Fatty Acids (FISH OIL) 1000 MG CAPS Take 1,000 mg by mouth daily.    [provider]  polyethylene glycol (MIRALAX / GLYCOLAX) packet Take 17 g by mouth daily. Patient taking differently: Take 17 g by mouth every 12 (twelve) hours as needed (for constipation). 10/24/17   Florencia Reasons, MD  senna (SENOKOT) 8.6 MG TABS tablet Take 1 tablet by mouth daily as needed for mild constipation.    [provider]  senna-docusate (SENOKOT-S) 8.6-50 MG tablet Take 1 tablet by mouth 2 (two) times daily. Patient not taking: No sig reported 10/23/17   Florencia Reasons, MD  vitamin B-12 (CYANOCOBALAMIN) 1000 MCG tablet Take 1,000 mcg by mouth daily.    [provider]  enoxaparin (LOVENOX) 40 MG/0.4ML injection Inject 0.4 mLs (40 mg total) into the skin daily. Patient not taking: Reported on 03/04/2019 10/21/17 12/11/19  Hiram Gash, MD    Allergies    Patient has no known allergies.  Review of Systems   Review of Systems  All other systems reviewed and are negative.  Physical Exam Updated Vital Signs BP 133/81 (BP Location: Right Arm)   Pulse 72   Temp 97.9 F (36.6 C) (Oral)   Resp 16   Ht 5\' 8"  (1.727 m)   Wt 66.9 kg   SpO2 100%   BMI 22.43 kg/m   Physical Exam Vitals and nursing note reviewed.  Constitutional:      General: She is not in acute distress.    Appearance: She is well-developed. She is not diaphoretic.  HENT:     Head: Normocephalic and atraumatic.  Cardiovascular:     Rate and Rhythm: Normal rate and regular rhythm.     Heart sounds: No murmur heard.   No friction rub. No gallop.  Pulmonary:     Effort: Pulmonary effort is normal. No respiratory distress.     Breath sounds: Normal breath sounds. No wheezing.  Abdominal:     General: Bowel sounds are normal. There is no distension.     Palpations: Abdomen is soft.     Tenderness: There is no abdominal tenderness.  Musculoskeletal:        General: Normal  range of motion.     Cervical back: Normal range of motion and neck supple.     Comments: The left foot is somewhat cool to the touch with slow capillary refill.  I am unable to palpate DP or PT pulses.  I am also unable to obtain DP or PT pulses with Doppler.  The right foot has dopplerable pulses, but also feels somewhat cool to the touch.  Skin:    General: Skin is warm and  dry.  Neurological:     General: No focal deficit present.     Mental Status: She is alert and oriented to person, place, and time.    ED Results / Procedures / Treatments   Labs (all labs ordered are listed, but only abnormal results are displayed) Labs Reviewed  RESP PANEL BY RT-PCR (FLU A&B, COVID) ARPGX2  BASIC METABOLIC PANEL  CBC WITH DIFFERENTIAL/PLATELET  PROTIME-INR    EKG None  Radiology No results found.  Procedures Procedures   Medications Ordered in ED Medications - No data to display  ED Course  I have reviewed the triage vital signs and the nursing notes.  Pertinent labs & imaging results that were available during my care of the patient were reviewed by me and considered in my medical decision making (see chart for details).    MDM Rules/Calculators/A&P  Patient presenting here with pain in the left foot.  I am unable to palpate or Doppler pulses to the left foot.  I have discussed this with Dr. Oneida Alar who is recommending patient be transferred to [facility] for further evaluation.  I have notified Dr. Leonette Monarch and Dr. Dayna Barker who accept patient in transfer to the [facility] ER.  Laboratory studies ordered and pending.  Final Clinical Impression(s) / ED Diagnoses Final diagnoses:  None    Rx / DC Orders ED Discharge Orders     None        Veryl Speak, MD 08/31/20 [phone]

## 2020-08-31 NOTE — ED Notes (Signed)
Report given to Caren Griffins, RN of [phone]

## 2020-08-31 NOTE — Consult Note (Signed)
ASSESSMENT & PLAN   CHRONIC LEFT LE ISCHEMIA: This patient has chronic ischemia of the left lower extremity.  She does have some Doppler flow in the left.  I do not think that she has had an acute arterial event.  I think she is at high risk for limb loss and I would recommend arteriography.  Hopefully she might have something to do for an endovascular approach.  I would not recommend an aggressive tibial bypass however given that she is nonambulatory and given the risk associated with a more aggressive approach.  I will try to schedule arteriogram for later in the week.  REASON FOR CONSULT:    Ischemic left leg. Consult is requested by the ED.   HPI:   Amber [name] is a 87 y.o. female who resides in a SNF. She is non-ambulatory. She has had pain in the left heel for over 1 week. She was seen as [facility] and transferred to [facility]. Of note she had a tumor (sarcoma?) resected from left leg many years ago.  On my history the patient has been having pain in left heel for well over a week.  The family was concerned and ultimately she was sent to [facility] and transferred here.  She is nonambulatory.  She states that she is in a wheelchair and pivot some on her leg but does not walk.  I do not get any clear-cut history of rest pain in either foot.  Risk factors for peripheral vascular disease include a remote history of tobacco use.  She quit in 1985.  She denies any history of diabetes, hypertension, hypercholesterolemia, or family history of premature cardiovascular disease.  Past Medical History:  Diagnosis Date   Complication of anesthesia    "woke up jumping and bouncing"   COVID-19    Hyperlipidemia    Hypertension    sarcoma of lt leg dx'd 1989   xrt and chemo and surg   Thyroid disease     Family History  Problem Relation Age of Onset   Lung cancer Sister    Bone cancer Brother    Stomach cancer Sister     SOCIAL HISTORY: Social History   Tobacco Use   Smoking status:  Former    Types: Cigarettes    Quit date: 03/27/1983    Years since quitting: 37.4   Smokeless tobacco: Never  Substance Use Topics   Alcohol use: No    No Known Allergies  No current facility-administered medications for this encounter.   Current Outpatient Medications  Medication Sig Dispense Refill   acetaminophen (TYLENOL) 500 MG tablet Take 500 mg by mouth 2 (two) times daily.     aspirin EC 81 MG tablet Take 81 mg by mouth daily.     benazepril (LOTENSIN) 20 MG tablet Take 10 mg by mouth daily.     fluticasone (FLONASE) 50 MCG/ACT nasal spray Place 1 spray into both nostrils daily as needed for allergies or rhinitis.     levothyroxine (SYNTHROID) 50 MCG tablet Take 50 mcg by mouth daily before breakfast.     lovastatin (MEVACOR) 20 MG tablet Take 20 mg by mouth at bedtime.     Multiple Vitamin (MULTIVITAMIN WITH MINERALS) TABS tablet Take 1 tablet by mouth daily.     Omega-3 Fatty Acids (FISH OIL) 1000 MG CAPS Take 1,000 mg by mouth daily.     polyethylene glycol (MIRALAX / GLYCOLAX) packet Take 17 g by mouth daily. (Patient taking differently: Take 17 g by  mouth every 12 (twelve) hours as needed (for constipation).) 14 each 0   senna (SENOKOT) 8.6 MG TABS tablet Take 1 tablet by mouth daily as needed for mild constipation.     senna-docusate (SENOKOT-S) 8.6-50 MG tablet Take 1 tablet by mouth 2 (two) times daily. (Patient not taking: No sig reported) 30 tablet 0   vitamin B-12 (CYANOCOBALAMIN) 1000 MCG tablet Take 1,000 mcg by mouth daily.      REVIEW OF SYSTEMS:  [X]  denotes positive finding, [ ]  denotes negative finding Cardiac  Comments:  Chest pain or chest pressure:    Shortness of breath upon exertion:    Short of breath when lying flat:    Irregular heart rhythm:        Vascular    Pain in calf, thigh, or hip brought on by ambulation:    Pain in feet at night that wakes you up from your sleep:     Blood clot in your veins:    Leg swelling:         Pulmonary     Oxygen at home:    Productive cough:     Wheezing:         Neurologic    Sudden weakness in arms or legs:     Sudden numbness in arms or legs:     Sudden onset of difficulty speaking or slurred speech:    Temporary loss of vision in one eye:     Problems with dizziness:         Gastrointestinal    Blood in stool:     Vomited blood:         Genitourinary    Burning when urinating:     Blood in urine:        Psychiatric    Major depression:         Hematologic    Bleeding problems:    Problems with blood clotting too easily:        Skin    Rashes or ulcers:        Constitutional    Fever or chills:    -  PHYSICAL EXAM:   Vitals:   08/31/20 0603 08/31/20 0605 08/31/20 0725  BP:  133/81 (!) 169/73  Pulse:  72 71  Resp:  16 18  Temp:  97.9 F (36.6 C) 98.2 F (36.8 C)  TempSrc:  Oral Oral  SpO2:  100% 99%  Weight: 66.9 kg  67.9 kg  Height: 5\' 8"  (1.727 m)  5\' 8"  (1.727 m)   Body mass index is 22.76 kg/m.  GENERAL: The patient is a well-nourished female, in no acute distress. The vital signs are documented above. CARDIAC: There is a regular rate and rhythm.  VASCULAR: No carotid bruits. On the left side, which is the side of concern, she has a femoral pulse. She has a monophasic, dampened PT and AT signal with the doppler. No DP or PER On the right side she has a femoral and popliteal pulse. DP, PER, and PT signal with the doppler.  PULMONARY: There is good air exchange bilaterally without wheezing or rales. ABDOMEN: Soft and non-tender with normal pitched bowel sounds.  MUSCULOSKELETAL: There are no major deformities. NEUROLOGIC: She has some left upper extremity weakness which she attributes to her elbow problem. SKIN: There are no ulcers or rashes noted. PSYCHIATRIC: The patient has a normal affect.  DATA:    ABI's: I have independently interpreted her arterial Doppler study today.  On the  right side she has a triphasic dorsalis pedis and posterior  tibial signal.  On the left side she had a dampened monophasic posterior tibial signal.  The patient did not tolerate blood pressure cuff so an ABI could not be obtained.  ARTERIAL DUPLEX: pend  LABS: GFR = 53, CRT = 1.03. WBC = 4.7, PLT = [facility]

## 2020-08-31 NOTE — ED Triage Notes (Signed)
Pt brought in as a transfer from [facility]  originally from [facility] for left leg pain. Per [facility], no pulse by doppler or palpation. Leg is warm to touch and ot has sensation of touch. Reported pt has dementia hx. Pt transferred for vascular consult.   176/81, 69HR NRS, 98%RA

## 2020-08-31 NOTE — Progress Notes (Signed)
ABI has been completed.  Attempted lower extremity arterial duplex, unable to complete due to patient pain tolerance and patient movement.   Preliminary results in CV Proc.   Abram Sander 08/31/2020 9:14 AM

## 2020-08-31 NOTE — Progress Notes (Signed)
ANTICOAGULATION CONSULT NOTE - Consult  Pharmacy Consult for heparin Indication:  Limb ischemia  No Known Allergies  Patient Measurements: Height: 5\' 8"  (172.7 cm) Weight: 67.9 kg (149 lb 11.1 oz) IBW/kg (Calculated) : 63.9 Heparin Dosing Weight: 67.9 kg  Vital Signs: Temp: 98.2 F (36.8 C) (07/19 1100) Temp Source: Oral (07/19 1100) BP: 163/73 (07/19 1100) Pulse Rate: 73 (07/19 1100)  Labs: Recent Labs    08/31/20 0642 08/31/20 0828 08/31/20 1643  HGB 10.2*  --   --   HCT 32.1*  --   --   PLT 184  --   --   APTT  --  36  --   LABPROT 14.1  --   --   INR 1.1  --   --   HEPARINUNFRC  --   --  0.33  CREATININE 1.03*  --   --      Estimated Creatinine Clearance: 39.5 mL/min (A) (by C-G formula based on SCr of 1.03 mg/dL (H)).  Assessment: 87 yo F from ALF for leg pain - LLE without pulses on doppler. Transfer to [facility]. No AC PTA.  Heparin level 0.33.  Goal of Therapy:  Heparin level 0.3-0.7 units/ml Monitor platelets by anticoagulation protocol: Yes   Plan: Dosing on conservative side given age and renal function  Continue heparin infusion at 1150 units/hr Check anti-Xa level in 8 hours and daily while on heparin Continue to monitor H&H and platelets  Alanda Slim, PharmD, [facility] Clinical Pharmacist Please see AMION for all Pharmacists' Contact Phone Numbers 08/31/2020, 6:17 PM

## 2020-08-31 NOTE — ED Notes (Signed)
Pt back in room.

## 2020-08-31 NOTE — H&P (Addendum)
Date: 08/31/2020               Patient Name:  Amber [name] MRN: [id number]  DOB: 10/30/1932 Age / Sex: 87 y.o., female   PCP: Heywood Bene, PA-C         Medical Service: Internal Medicine Teaching Service         Attending Physician: Dr. Lucious Groves, DO    First Contact: Dr. Elliot Gurney Pager: [phone]  Second Contact: Dr. Marianna Payment Pager: [phone]       After Hours (After 5p/  First Contact Pager: [phone]  weekends / holidays): Second Contact Pager: [phone]   Chief Complaint: Left leg pain  History of Present Illness:  Amber [name] is an 87 year old female with a past medical history of hypertension, hyperlipidemia, sarcoma of the left leg (treated in 1989), and hypothyroidism who to [facility] from [place] with complaints of left leg pain.  Patient states that the pain has been present for at least 1 week.  She states that the pain is located in her left calf and heel with no radiation.  She describes the pain as "terrible" and is unable to further characterize the pain.  She states that moving the affected limb increases her pain and has progressively gotten worse over the past several days. Nothing alleviates her pain. She states that this is likely due to her having a rare cancer of her lower left extremity that was diagnosed in 1989 and she was sent to [place] to have her cancer treated via surgical excision and chemotherapy.  She denies headaches, vision changes, nausea, vomiting, apnea, chest pain, abdominal pain, or myalgias.  Meds:  Synthroid to 50 MCG daily before breakfast Aspirin 81 mg daily Benazepril 20 mg daily Omega-3 fatty acid 1000 mg tablet daily Vitamin B12 1000 MCG daily Lovastatin 20 mg at bedtime Multivitamin once daily   Allergies: Allergies as of 08/31/2020   (No Known Allergies)   Past Medical History:  Diagnosis Date   Complication of anesthesia    "woke up jumping and bouncing"   COVID-19    Hyperlipidemia     Hypertension    sarcoma of lt leg dx'd 1989   xrt and chemo and surg   Thyroid disease     Family History:  Family History  Problem Relation Age of Onset   Lung cancer Sister    Bone cancer Brother    Stomach cancer Sister      Social History:  Residence: [place] [facility] Work: Currently retired, worked as a Educational psychologist in the past Social History   Socioeconomic History   Marital status: Married    Spouse name: Deidre Ala   Number of children: 1   Years of education: Not on file   Highest education level: Not on file  Occupational History   Not on file  Tobacco Use   Smoking status: Former    Types: Cigarettes    Quit date: 03/27/1983    Years since quitting: 37.4   Smokeless tobacco: Never  Vaping Use   Vaping Use: Never used  Substance and Sexual Activity   Alcohol use: No   Drug use: No   Sexual activity: Not on file  Other Topics Concern   Not on file  Social History Narrative   Lives with husband    Caffeine use: Coffee and diet soda    Right handed    Social Determinants of Health   Financial Resource Strain: Not on file  Food Insecurity: Not on file  Transportation Needs: Not on file  Physical Activity: Not on file  Stress: Not on file  Social Connections: Not on file  Intimate Partner Violence: Not on file     Review of Systems: A complete ROS was negative except as per HPI.   Physical Exam: Blood pressure (!) 186/111, pulse 71, temperature 98.2 F (36.8 C), temperature source Oral, resp. rate 14, height 5\' 8"  (1.727 m), weight 67.9 kg, SpO2 100 %. Physical Exam Vitals reviewed.  Constitutional:      General: She is not in acute distress.    Appearance: Normal appearance. She is not ill-appearing, toxic-appearing or diaphoretic.  Cardiovascular:     Rate and Rhythm: Normal rate and regular rhythm.     Pulses: Normal pulses.     Heart sounds: Normal heart sounds. No murmur heard.   No friction rub. No gallop.     Comments: 0 DP, 1+ PT in the LLE 2+  DP and PT in the RLE   Pulmonary:     Effort: Pulmonary effort is normal. No respiratory distress.     Breath sounds: Normal breath sounds. No stridor. No wheezing, rhonchi or rales.  Chest:     Chest wall: No tenderness.  Abdominal:     General: Abdomen is flat. Bowel sounds are normal. There is no distension.     Palpations: Abdomen is soft. There is no mass.     Tenderness: There is no abdominal tenderness. There is no guarding.  Musculoskeletal:     Right lower leg: No edema.     Left lower leg: No edema.     Comments: LUE contracted  Skin:    General: Skin is dry.     Coloration: Skin is not pale.     Findings: No erythema or rash.     Comments: LLE cool to the touch compared to the RLE  Neurological:     Mental Status: She is alert.     Comments: Sensation intact in the right foot Sensation intact in the medial, lateral, and plantar aspects of the left foot. Impaired sensation to the dorsum.  Psychiatric:        Mood and Affect: Mood normal.        Behavior: Behavior normal.     EKG: Normal sinus rhythm, with no ischemic changes.  US Venous Img Lower Unilateral Left (DVT)  Result Date: 08/31/2020 CLINICAL DATA:  87 year old with left leg pain. EXAM: LEFT LOWER EXTREMITY VENOUS DOPPLER ULTRASOUND TECHNIQUE: Gray-scale sonography with graded compression, as well as color Doppler and duplex ultrasound were performed to evaluate the lower extremity deep venous systems from the level of the common femoral vein and including the common femoral, femoral, profunda femoral, popliteal and calf veins including the posterior tibial, peroneal and gastrocnemius veins when visible. The superficial great saphenous vein was also interrogated. Spectral Doppler was utilized to evaluate flow at rest and with distal augmentation maneuvers in the common femoral, femoral and popliteal veins. COMPARISON:  None. FINDINGS: Contralateral Common Femoral Vein: Respiratory phasicity is normal and symmetric  with the symptomatic side. No evidence of thrombus. Normal compressibility. Common Femoral Vein: No evidence of thrombus. Normal compressibility, respiratory phasicity and response to augmentation. Saphenofemoral Junction: No evidence of thrombus. Normal compressibility and flow on color Doppler imaging. Profunda Femoral Vein: No evidence of thrombus. Normal compressibility and flow on color Doppler imaging. Femoral Vein: No evidence of thrombus. Normal compressibility, respiratory phasicity and response to augmentation. Popliteal Vein: No  evidence of thrombus. Normal compressibility, respiratory phasicity and response to augmentation. Calf Veins: Limited evaluation. Other Findings:  None. IMPRESSION: Negative for deep venous thrombosis in left lower extremity. Limited evaluation of left deep calf veins. Electronically Signed   By: Markus Daft M.D.   On: 08/31/2020 07:50   VAS [place] ABI WITH/WO TBI  Result Date: 08/31/2020  LOWER EXTREMITY DOPPLER STUDY Patient Name:  LANYAH SPENGLER  Date of Exam:   08/31/2020 Medical Rec #: [id number]       Accession #:    [id number] Date of Birth: 1932-05-30       Patient Gender: F Patient Age:   88Y Exam Location:  [facility] Procedure:      VAS [place] ABI WITH/WO TBI Referring Phys: [id number] --------------------------------------------------------------------------------  Indications: Peripheral artery disease. High Risk Factors: Hypertension, hyperlipidemia.  Limitations: Today's exam was limited due to unable to tolerate compression on              legs, left arm contracted. Comparison Study: no prior Performing Technologist: Archie Patten RVS  Examination Guidelines: A complete evaluation includes at minimum, Doppler waveform signals and systolic blood pressure reading at the level of bilateral brachial, anterior tibial, and posterior tibial arteries, when vessel segments are accessible. Bilateral testing is considered an integral part of a complete  examination. Photoelectric Plethysmograph (PPG) waveforms and toe systolic pressure readings are included as required and additional duplex testing as needed. Limited examinations for reoccurring indications may be performed as noted.  ABI Findings: +--------+------------------+-----+---------+--------+ Right   Rt Pressure (mmHg)IndexWaveform Comment  +--------+------------------+-----+---------+--------+ [id number]                    triphasic         +--------+------------------+-----+---------+--------+ PTA                            triphasic         +--------+------------------+-----+---------+--------+ DP                             triphasic         +--------+------------------+-----+---------+--------+ +--------+-----------------+-----+------------------+--------------------------+ Left    Lt Pressure      IndexWaveform          Comment                            (mmHg)                                                             +--------+-----------------+-----+------------------+--------------------------+ Brachial                                        unable to obtain-                                                          retracted                  +--------+-----------------+-----+------------------+--------------------------+  PTA                           dampened                                                                   monophasic                                   +--------+-----------------+-----+------------------+--------------------------+ DP                            absent                                       +--------+-----------------+-----+------------------+--------------------------+  Summary: Right: Unable to obtain ABI due to pt pain tolerance. Left: Unable to obtain ABI due to pt pain tolerance.  *See table(s) above for measurements and observations.     Preliminary      Assessment & Plan by  Problem: Active Problems:   Lower limb ischemia   Chronic Left Lower Extremity Ischemia:  Patient presents to [facility] ED with leg pain for at least 1 week in duration.  On physical examination both lower extremities are equally round in circumference, the left lower extremity is cool to the touch, has mild paresthesia of the dorsum of the affected appendage with no PD pulse.  Lower left extremity does not have skin changes, capillary refill is sluggish.  Patient able to lift left foot from the bed with some difficulty.  No signs of ulceration, gangrene, or infection at this time.  White count is unremarkable and there no signs of infection.  These signs are concerning for limb ischemia. Patient seen by vascular who states that this pain is likely chronic in nature. DVT studies were performed with no visible clot.  Unable to complete ABI secondary to contracted left upper extremity and pain, but preliminary results show absent DP pulse with a dampened monophasic PTA.  Patient likely has underlying PAD with attributing risk factors of advanced age, hyperlipidemia, hypertension, previous smoking history. We will continue work-up with arterial duplex, patient may likely need angiogram, will keep if further interventions are indicated.   - Appreciate Vascular's assistance and recommendations - Follow-up arterial duplex - Heparin per pharmacy - Obtain lipid panel - Norco 5-325 mg tablet every 6 hours as needed for pain control -Dilaudid 0.5 mg every 4 hours as needed for breakthrough pain  Hypothyroidism Patient on home dose of Synthroid 50 MCG daily.  Last TSH performed on 07/17/2020 was within normal limits. - Continue home Synthroid 50 MCG daily  Hyperlipidemia:  Patient currently on low intensity statin, will assess lipid panel and uptitrate if needed. - Continue home lovastatin 20 mg nightly - Lipid panel  Hypertension:  Patient with a history of hypertension on home benazepril 20 mg daily.   Pressures elevated with systolics in the 347Q to 259D, patient is unsure if he took her medications today.  She is currently asymptomatic we will start back her home medication. - Continue benazepril 20 mg  daily  Sarcoma of the Left Lower Extremity:  Diagnosed in 1989. Patient treated with chemotherapy and surgical excision with no recurrence.  Stable  Normocytic Anemia:  Patient with a past medical history of vitamin B12 deficiency presents with normocytic anemia.  Patiently currently on oral supplementation for the B12. Hemoglobin today of 10.2 with an MCV of 90.4. Patient currently asymptomatic. - Ferritin - Vitamin B12 - Folate - Iron panel  Dispo: Admit patient to Inpatient with expected length of stay greater than 2 midnights.  Signed: Maudie Mercury, MD 08/31/2020, 9:34 AM  Pager: [phone] After 5pm on weekdays and 1pm on weekends: On Call pager: [phone]

## 2020-08-31 NOTE — ED Notes (Signed)
Pt transported to vascular.  °

## 2020-08-31 NOTE — ED Notes (Signed)
Attempted to give report 

## 2020-09-01 ENCOUNTER — Encounter (HOSPITAL_COMMUNITY): Payer: Medicare Other

## 2020-09-01 DIAGNOSIS — D649 Anemia, unspecified: Secondary | ICD-10-CM

## 2020-09-01 DIAGNOSIS — E039 Hypothyroidism, unspecified: Secondary | ICD-10-CM

## 2020-09-01 DIAGNOSIS — E785 Hyperlipidemia, unspecified: Secondary | ICD-10-CM | POA: Diagnosis not present

## 2020-09-01 DIAGNOSIS — I10 Essential (primary) hypertension: Secondary | ICD-10-CM

## 2020-09-01 LAB — BASIC METABOLIC PANEL
Anion gap: 8 (ref 5–15)
BUN: 12 mg/dL (ref 8–23)
CO2: 24 mmol/L (ref 22–32)
Calcium: 8.7 mg/dL — ABNORMAL LOW (ref 8.9–10.3)
Chloride: 106 mmol/L (ref 98–111)
Creatinine, Ser: 1.04 mg/dL — ABNORMAL HIGH (ref 0.44–1.00)
GFR, Estimated: 52 mL/min — ABNORMAL LOW (ref >60)
Glucose, Bld: 88 mg/dL (ref 70–99)
Potassium: 4.3 mmol/L (ref 3.5–5.1)
Sodium: 138 mmol/L (ref 135–145)

## 2020-09-01 LAB — CBC
HCT: 33.6 % — ABNORMAL LOW (ref 36.0–46.0)
Hemoglobin: 10.9 g/dL — ABNORMAL LOW (ref 12.0–15.0)
MCH: 29.1 pg (ref 26.0–34.0)
MCHC: 32.4 g/dL (ref 30.0–36.0)
MCV: 89.6 fL (ref 80.0–100.0)
Platelets: 182 10*3/uL (ref 150–400)
RBC: 3.75 MIL/uL — ABNORMAL LOW (ref 3.87–5.11)
RDW: 13.6 % (ref 11.5–15.5)
WBC: 5.9 10*3/uL (ref 4.0–10.5)
nRBC: 0 % (ref 0.0–0.2)

## 2020-09-01 LAB — HEPARIN LEVEL (UNFRACTIONATED)
Heparin Unfractionated: <0.1 IU/mL — ABNORMAL LOW (ref 0.30–0.70)
Heparin Unfractionated: 0.67 IU/mL (ref 0.30–0.70)

## 2020-09-01 MED ORDER — HEPARIN BOLUS VIA INFUSION
2000.0000 [IU] | Freq: Once | INTRAVENOUS | Status: AC
  Administered 2020-09-01: 2000 [IU] via INTRAVENOUS
  Filled 2020-09-01: qty 2000

## 2020-09-01 NOTE — TOC Progression Note (Signed)
Transition of Care [facility]) - Progression Note    Patient Details  Name: Amber [name] MRN: [id number] Date of Birth: 1932-03-25  [facility] of [facility]) CM/SW Contact  Milinda Antis, [place] Phone Number: 09/01/2020, 1:03 PM  Clinical Narrative:     CSW met with patient at bedside. Patient reports wanting to return to [place] at d/c.    13:08-  CSW contacted [facility] and spoke with Thornton Dales to inquire about what would be needed for the patient to return. CSW was informed that the patient could return to the facility.  The facility requested that an updated FL2, any new orders, and d/c summary be faxed to [phone] when patient is ready to d/c        Expected Discharge Plan and Services                                                 Social Determinants of Health (SDOH) Interventions    Readmission Risk Interventions No flowsheet data found.

## 2020-09-01 NOTE — Progress Notes (Signed)
VASCULAR SURGERY:  Patient is scheduled for an aortogram on Friday.  Amber Gallop, MD 1:05 PM

## 2020-09-01 NOTE — Progress Notes (Signed)
HD#1 SUBJECTIVE:  Patient Summary: Amber [name] is a 87 y.o. with a pertinent PMH of HTN, HLD, sarcoma of the left leg (treated in 1989), and hypothyroidism, who presented with left leg pain and admitted for left lower limb ischemia.   Overnight Events: no acute events overnight    Interm History: Patient seen and evaluated at bedside. Reports no acute events overnight. Pain controlled in her leg. No complaints at this time.  OBJECTIVE:  Vital Signs: Vitals:   08/31/20 1015 08/31/20 1030 08/31/20 1100 08/31/20 1951  BP: (!) 180/133 (!) 187/120 (!) 163/73 (!) 170/75  Pulse: 72 71 73 71  Resp: (!) 21 16 17 20   Temp:   98.2 F (36.8 C) 98.4 F (36.9 C)  TempSrc:   Oral   SpO2: 100% 97% 91% 100%  Weight:      Height:       Supplemental O2: Room Air SpO2: 100 % O2 Flow Rate (L/min): 0 L/min  Filed Weights   08/31/20 0603 08/31/20 0725  Weight: 66.9 kg 67.9 kg     Intake/Output Summary (Last 24 hours) at 09/01/2020 0527 Last data filed at 08/31/2020 1808 Gross per 24 hour  Intake --  Output 1300 ml  Net -1300 ml   Net IO Since Admission: -1,300 mL [09/01/20 0527]  Physical Exam: Physical Exam HENT:     Head: Atraumatic.  Cardiovascular:     Rate and Rhythm: Normal rate and regular rhythm.     Comments: Decreased pulse left lower extremity Musculoskeletal:     Cervical back: Neck supple.     Comments: Surgical scar of left lower extremity, erythema, cool to the touch, minimal swelling extending from left mid-calf to foot. Able to move toes. Sensation intact  Skin:    General: Skin is dry.  Neurological:     General: No focal deficit present.     Mental Status: She is alert and oriented to person, place, and time.     Comments: Tangential thought    Patient Lines/Drains/Airways Status     Active Line/Drains/Airways     Name Placement date Placement time Site Days   Peripheral IV 08/31/20 22 G Distal;Right;Lateral Forearm 08/31/20  1120  Forearm  1    Peripheral IV 08/31/20 22 G 1.75" Anterior;Right Forearm 08/31/20  1503  Forearm  1   Incision (Closed) 01/20/16 Hip Right 01/20/16  0928  -- 1686   Incision (Closed) 10/20/17 Hip Left 10/20/17  0909  -- 1047            Pertinent Labs: CBC Latest Ref Rng & Units 09/01/2020 08/31/2020 07/16/2020  WBC 4.0 - 10.5 K/uL 5.9 4.7 10.4  Hemoglobin 12.0 - 15.0 g/dL 10.9(L) 10.2(L) 13.0  Hematocrit 36.0 - 46.0 % 33.6(L) 32.1(L) 39.9  Platelets 150 - 400 K/uL 182 184 241    CMP Latest Ref Rng & Units 09/01/2020 08/31/2020 07/17/2020  Glucose 70 - 99 mg/dL 88 90 78  BUN 8 - 23 mg/dL 12 13 18   Creatinine 0.44 - 1.00 mg/dL 1.04(H) 1.03(H) 0.89  Sodium 135 - 145 mmol/L 138 142 140  Potassium 3.5 - 5.1 mmol/L 4.3 4.2 4.3  Chloride 98 - 111 mmol/L 106 109 115(H)  CO2 22 - 32 mmol/L 24 27 17(L)  Calcium 8.9 - 10.3 mg/dL 8.7(L) 8.4(L) 8.0(L)  Total Protein 6.5 - 8.1 g/dL - - -  Total Bilirubin 0.3 - 1.2 mg/dL - - -  Alkaline Phos 38 - 126 U/L - - -  AST 15 - 41 U/L - - -  ALT 0 - 44 U/L - - -    No results for input(s): GLUCAP in the last 72 hours.   Pertinent Imaging: US Venous Img Lower Unilateral Left (DVT)  Result Date: 08/31/2020 CLINICAL DATA:  87 year old with left leg pain. EXAM: LEFT LOWER EXTREMITY VENOUS DOPPLER ULTRASOUND TECHNIQUE: Gray-scale sonography with graded compression, as well as color Doppler and duplex ultrasound were performed to evaluate the lower extremity deep venous systems from the level of the common femoral vein and including the common femoral, femoral, profunda femoral, popliteal and calf veins including the posterior tibial, peroneal and gastrocnemius veins when visible. The superficial great saphenous vein was also interrogated. Spectral Doppler was utilized to evaluate flow at rest and with distal augmentation maneuvers in the common femoral, femoral and popliteal veins. COMPARISON:  None. FINDINGS: Contralateral Common Femoral Vein: Respiratory phasicity is normal  and symmetric with the symptomatic side. No evidence of thrombus. Normal compressibility. Common Femoral Vein: No evidence of thrombus. Normal compressibility, respiratory phasicity and response to augmentation. Saphenofemoral Junction: No evidence of thrombus. Normal compressibility and flow on color Doppler imaging. Profunda Femoral Vein: No evidence of thrombus. Normal compressibility and flow on color Doppler imaging. Femoral Vein: No evidence of thrombus. Normal compressibility, respiratory phasicity and response to augmentation. Popliteal Vein: No evidence of thrombus. Normal compressibility, respiratory phasicity and response to augmentation. Calf Veins: Limited evaluation. Other Findings:  None. IMPRESSION: Negative for deep venous thrombosis in left lower extremity. Limited evaluation of left deep calf veins. Electronically Signed   By: Markus Daft M.D.   On: 08/31/2020 07:50   VAS [place] ABI WITH/WO TBI  Result Date: 08/31/2020  LOWER EXTREMITY DOPPLER STUDY Patient Name:  Amber [name]  Date of Exam:   08/31/2020 Medical Rec #: [id number]       Accession #:    [id number] Date of Birth: [birth year]       Patient Gender: F Patient Age:   88Y Exam Location:  [facility] Procedure:      VAS [place] ABI WITH/WO TBI Referring Phys: [id number] Boykins --------------------------------------------------------------------------------  Indications: Peripheral artery disease. High Risk Factors: Hypertension, hyperlipidemia.  Limitations: Today's exam was limited due to unable to tolerate compression on              legs, left arm contracted. Comparison Study: no prior Performing Technologist: Archie Patten RVS  Examination Guidelines: A complete evaluation includes at minimum, Doppler waveform signals and systolic blood pressure reading at the level of bilateral brachial, anterior tibial, and posterior tibial arteries, when vessel segments are accessible. Bilateral testing is considered an integral part of a  complete examination. Photoelectric Plethysmograph (PPG) waveforms and toe systolic pressure readings are included as required and additional duplex testing as needed. Limited examinations for reoccurring indications may be performed as noted.  ABI Findings: +--------+------------------+-----+---------+--------+ Right   Rt Pressure (mmHg)IndexWaveform Comment  +--------+------------------+-----+---------+--------+ [id number]                    triphasic         +--------+------------------+-----+---------+--------+ PTA                            triphasic         +--------+------------------+-----+---------+--------+ DP  triphasic         +--------+------------------+-----+---------+--------+ +--------+-----------------+-----+------------------+--------------------------+ Left    Lt Pressure      IndexWaveform          Comment                            (mmHg)                                                             +--------+-----------------+-----+------------------+--------------------------+ Brachial                                        unable to obtain-                                                          retracted                  +--------+-----------------+-----+------------------+--------------------------+ PTA                           dampened                                                                   monophasic                                   +--------+-----------------+-----+------------------+--------------------------+ DP                            absent                                       +--------+-----------------+-----+------------------+--------------------------+  Summary: Right: Unable to obtain ABI due to pt pain tolerance. Left: Unable to obtain ABI due to pt pain tolerance.  *See table(s) above for measurements and observations.  Electronically signed by Deitra Mayo MD  on 08/31/2020 at 5:16:21 PM.    Final     ASSESSMENT/PLAN:  Assessment: Active Problems:   Lower limb ischemia   Amber [name] is a 87 y.o. with a pertinent PMH of HTN, HLD, sarcoma of the left leg (treated in 1989), and hypothyroidism, who presented with left leg pain and admitted for left lower limb ischemia on hospital day 1  Plan: Chronic Left Lower Extremity Ischemia:  Leg pain for at least 1 week in duration.  On physical examination both lower extremities are equally round in circumference, the left lower extremity is cool to the touch, has mild paresthesia of the dorsum of the affected appendage with no PD pulse.  Lower left extremity does  not have skin changes, capillary refill is sluggish.  Patient able to lift left foot from the bed with some difficulty.  No signs of ulceration, gangrene, or infection at this time.  White count is unremarkable and there no signs of infection.  These signs are concerning for limb ischemia. Patient seen by vascular who states that this pain is likely chronic in nature. DVT studies were performed with no visible clot.  Unable to complete ABI secondary to contracted left upper extremity and pain, but preliminary results show absent DP pulse with a dampened monophasic PTA.  Patient likely has underlying PAD with attributing risk factors of advanced age, hyperlipidemia, hypertension, previous smoking history. Angiogram scheduled for 7/22, will keep if further interventions are indicated.   - Appreciate Vascular's assistance and recommendations - Follow-up arterial duplex - Heparin per pharmacy - Norco 5-325 mg tablet every 6 hours as needed for pain control -Dilaudid 0.5 mg every 4 hours as needed for breakthrough pain   Hypothyroidism Patient on home dose of Synthroid 50 MCG daily.  Last TSH performed on 07/17/2020 was within normal limits. - Continue home Synthroid 50 MCG daily   Hyperlipidemia:  Patient currently on low intensity statin. Lipid panel  normal - Continue home lovastatin 20 mg nightly - Lipid panel   Hypertension:  Patient with a history of hypertension on home benazepril 20 mg daily.  Pressures elevated with systolics in the 939Q to 300P, patient is unsure if he took her medications today.  She is currently asymptomatic we will start back her home medication. BP's elevated today - Continue benazepril 20 mg daily   Sarcoma of the Left Lower Extremity:  Diagnosed in 1989. Patient treated with chemotherapy and surgical excision with no recurrence.  Stable   Normocytic Anemia:  Patient with a past medical history of vitamin B12 deficiency presents with normocytic anemia.  Patiently currently on oral supplementation for the B12. Hemoglobin today of 10.2 with an MCV of 90.4. Patient currently asymptomatic. Iron studies wnl - Ferritin 30 - Vitamin B12 554 - Folate 9.0 - Iron panel 47  Best Practice: Diet: Regular diet IVF: Fluids: none, Rate: None VTE:  Code: DNR AB: none   Signature: Delene Ruffini, MD  Internal Medicine Resident, PGY-1 [facility]  Pager: [phone] 5:27 AM, 09/01/2020   Please contact the on call pager after 5 pm and on weekends at [phone].

## 2020-09-01 NOTE — Progress Notes (Signed)
ANTICOAGULATION CONSULT NOTE  Pharmacy Consult for heparin Indication:  Limb ischemia  No Known Allergies  Patient Measurements: Height: 5\' 8"  (172.7 cm) Weight: 67.9 kg (149 lb 11.1 oz) IBW/kg (Calculated) : 63.9 Heparin Dosing Weight: 67.9 kg  Vital Signs: Temp: 98.4 F (36.9 C) (07/19 1951) BP: 170/75 (07/19 1951) Pulse Rate: 71 (07/19 1951)  Labs: Recent Labs    08/31/20 0642 08/31/20 0828 08/31/20 1643 09/01/20 0158  HGB 10.2*  --   --  10.9*  HCT 32.1*  --   --  33.6*  PLT 184  --   --  182  APTT  --  36  --   --   LABPROT 14.1  --   --   --   INR 1.1  --   --   --   HEPARINUNFRC  --   --  0.33 <0.10*  CREATININE 1.03*  --   --  1.04*     Estimated Creatinine Clearance: 39.2 mL/min (A) (by C-G formula based on SCr of 1.04 mg/dL (H)).  Assessment: 87 yo F from ALF for leg pain - LLE without pulses on doppler. Transfer to [facility] ER. No AC PTA.  Heparin level down to undetectable on gtt at 1150 units/hr. No issues with line or bleeding reported per RN. CBC stable.  Goal of Therapy:  Heparin level 0.3-0.7 units/ml Monitor platelets by anticoagulation protocol: Yes   Plan:  Rebolus heparin 2000 units and increase gtt to 1350 units/h Will f/u 8 hr heparin level  Sherlon Handing, PharmD, BCPS Please see amion for complete clinical pharmacist phone list 09/01/2020, 4:03 AM

## 2020-09-01 NOTE — Progress Notes (Signed)
ANTICOAGULATION CONSULT NOTE - Follow Up Consult  Pharmacy Consult for IV Heparin Indication:  Limb ischemia  No Known Allergies  Patient Measurements: Height: 5\' 8"  (172.7 cm) Weight: 67.9 kg (149 lb 11.1 oz) IBW/kg (Calculated) : 63.9 Heparin Dosing Weight: 67.9 kg  Vital Signs: Temp: 97.9 F (36.6 C) (07/20 1546) Temp Source: Oral (07/20 1546) BP: 144/60 (07/20 1546) Pulse Rate: 74 (07/20 1546)  Labs: Recent Labs    08/31/20 0642 08/31/20 0828 08/31/20 1643 09/01/20 0158 09/01/20 1518  HGB 10.2*  --   --  10.9*  --   HCT 32.1*  --   --  33.6*  --   PLT 184  --   --  182  --   APTT  --  36  --   --   --   LABPROT 14.1  --   --   --   --   INR 1.1  --   --   --   --   HEPARINUNFRC  --   --  0.33 <0.10* 0.67  CREATININE 1.03*  --   --  1.04*  --     Estimated Creatinine Clearance: 39.2 mL/min (A) (by C-G formula based on SCr of 1.04 mg/dL (H)).  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 Pharmacy was consulted to dose IV heparin for LL limb ischemia; pt was on no anticoagulants PTA.   Heparin level ~11 hrs after pt rec'd heparin 2000 units IV bolus X 1, followed by increasing heparin infusion to 1350 units/hr, was 0.67 units/ml, which is at the upper end of the goal range for this pt. H/H 11/22/31.6, plt 182 (CBC stable). Per RN, no issues with IV or bleeding observed.  Pt is scheduled for aortogram by [facility] on Friday.  Goal of Therapy:  Heparin level 0.3-0.7 units/ml Monitor platelets by anticoagulation protocol: Yes   Plan:  Reduce heparin infusion to 1300 units/hr Check heparin level in 8 hrs Monitor daily heparin level, CBC Monitor for bleeding  Gillermina Hu, PharmD, BCPS, [facility] Clinical Pharmacist 09/01/2020, 5:30 PM

## 2020-09-02 DIAGNOSIS — E039 Hypothyroidism, unspecified: Secondary | ICD-10-CM | POA: Diagnosis not present

## 2020-09-02 DIAGNOSIS — E785 Hyperlipidemia, unspecified: Secondary | ICD-10-CM | POA: Diagnosis not present

## 2020-09-02 DIAGNOSIS — D649 Anemia, unspecified: Secondary | ICD-10-CM | POA: Diagnosis not present

## 2020-09-02 DIAGNOSIS — I10 Essential (primary) hypertension: Secondary | ICD-10-CM | POA: Diagnosis not present

## 2020-09-02 DIAGNOSIS — Z0000 Encounter for general adult medical examination without abnormal findings: Secondary | ICD-10-CM

## 2020-09-02 LAB — RESP PANEL BY RT-PCR (FLU A&B, COVID) ARPGX2
Influenza A by PCR: NEGATIVE
Influenza B by PCR: NEGATIVE
SARS Coronavirus 2 by RT PCR: NEGATIVE

## 2020-09-02 LAB — HEPARIN LEVEL (UNFRACTIONATED)
Heparin Unfractionated: >1.1 IU/mL — ABNORMAL HIGH (ref 0.30–0.70)
Heparin Unfractionated: 0.85 IU/mL — ABNORMAL HIGH (ref 0.30–0.70)

## 2020-09-02 MED ORDER — HYDROCODONE-ACETAMINOPHEN 5-325 MG PO TABS: 1.0000 | ORAL_TABLET | Freq: Four times a day (QID) | ORAL | 0 refills | Status: DC | PRN

## 2020-09-02 MED ORDER — FERROUS SULFATE 325 (65 FE) MG PO TABS
325.0000 mg | ORAL_TABLET | Freq: Two times a day (BID) | ORAL | Status: DC
  Administered 2020-09-02 – 2020-09-03 (×3): 325 mg via ORAL
  Filled 2020-09-02 (×3): qty 1

## 2020-09-02 MED ORDER — HEPARIN (PORCINE) 25000 UT/250ML-% IV SOLN
800.0000 [IU]/h | INTRAVENOUS | Status: DC
  Administered 2020-09-02: 1100 [IU]/h via INTRAVENOUS
  Filled 2020-09-02: qty 250

## 2020-09-02 MED ORDER — FERROUS SULFATE 325 (65 FE) MG PO TABS: 325.0000 mg | ORAL_TABLET | Freq: Two times a day (BID) | ORAL | 0 refills | Status: DC

## 2020-09-02 NOTE — Discharge Summary (Addendum)
Name: Amber [name] MRN: [id number] DOB: March 06, 1932 87 y.o. PCP: Merwyn Katos  Date of Admission: 08/31/2020  6:06 AM Date of Discharge: 09/03/2020 Attending Physician: Lucious Groves, DO  Discharge Diagnosis: 1. Chronic left lower extremity ischemia Hypothyoidism Hyperlipidemia Hypertension Sarcoma of the left lower extremity Iron deficiency anemia  Discharge Medications: Allergies as of 09/03/2020   No Known Allergies      Medication List     STOP taking these medications    acetaminophen 500 MG tablet Commonly known as: TYLENOL       TAKE these medications    aspirin EC 81 MG tablet Take 81 mg by mouth daily.   benazepril 20 MG tablet Commonly known as: LOTENSIN Take 10 mg by mouth daily.   ferrous sulfate 325 (65 FE) MG tablet Take 1 tablet (325 mg total) by mouth 2 (two) times daily with a meal.   Fish Oil 1000 MG Caps Take 1,000 mg by mouth daily.   fluticasone 50 MCG/ACT nasal spray Commonly known as: FLONASE Place 1 spray into both nostrils daily as needed for allergies or rhinitis.   HYDROcodone-acetaminophen 5-325 MG tablet Commonly known as: NORCO/VICODIN Take 1 tablet by mouth every 6 (six) hours as needed for up to 7 days for severe pain.   levothyroxine 50 MCG tablet Commonly known as: SYNTHROID Take 50 mcg by mouth daily before breakfast.   lovastatin 20 MG tablet Commonly known as: MEVACOR Take 20 mg by mouth at bedtime.   multivitamin with minerals Tabs tablet Take 1 tablet by mouth daily.   senna-docusate 8.6-50 MG tablet Commonly known as: Senokot-S Take 1 tablet by mouth 2 (two) times daily.   vitamin B-12 1000 MCG tablet Commonly known as: CYANOCOBALAMIN Take 1,000 mcg by mouth daily.       ASK your doctor about these medications    polyethylene glycol 17 g packet Commonly known as: MIRALAX / GLYCOLAX Take 17 g by mouth daily.        Disposition and follow-up:   Amber [name] was  discharged from [facility] in Stable condition.  At the hospital follow up visit please address:  1.  Chronic Left Lower Extremity Ischemia:  Leg pain for at least 1 week in duration.  On physical examination both lower extremities are equally round in circumference, the left lower extremity is cool to the touch, has mild paresthesia of the dorsum of the affected appendage with no PD pulse.  Lower left extremity does not have skin changes, capillary refill is sluggish.  Patient able to lift left foot from the bed with some difficulty.  No signs of ulceration, gangrene, or infection at this time. No signs of infection. Patient seen by vascular who states that this pain is likely chronic in nature. DVT studies were performed with no visible clot.  Unable to complete ABI secondary to contracted left upper extremity and pain, but preliminary results show absent DP pulse with a dampened monophasic PTA.  Patient likely has underlying PAD with attributing risk factors of advanced age, hyperlipidemia, hypertension, previous smoking history.  - patient and her daughter discussed with Vascular and decided not to pursue aortogram. No other procedures planned at this time.  - She will need to follow up with Vascular as an outpatient. - Norco 5-325 mg tablet every 6 hours as needed for pain control to be taken through 7/28 until pcp follow up   Hypothyroidism Patient on home dose of Synthroid 50 MCG  daily.  Last TSH performed on 07/17/2020 was within normal limits. - Continue home Synthroid 50 MCG daily   Hyperlipidemia:  Patient currently on low intensity statin. Lipid panel normal - Continue home lovastatin 20 mg nightly - Lipid panel   Hypertension:  Patient with a history of hypertension on home benazepril 20 mg daily.  Pressures elevated with systolics in the 384Y to 659D, patient is unsure if he took her medications today.  She is currently asymptomatic . BP's stable. - Continue  benazepril 20 mg daily   Sarcoma of the Left Lower Extremity:  Diagnosed in 1989. Patient treated with chemotherapy and surgical excision with no recurrence.  Stable   Iron deficiency Anemia:  Patient with a past medical history of vitamin B12 deficiency presents with normocytic anemia.  Patiently currently on oral supplementation for the B12. Hemoglobin today of 10.2 with an MCV of 90.4. Patient currently asymptomatic.  - Will supplement iron    2.  Labs / imaging needed at time of follow-up: bmp  3.  Pending labs/ test needing follow-up: none  Follow-up Appointments:  Follow-up Information     Heywood Bene, PA-C Follow up in 1 week(s).   Specialty: Physician Assistant Contact information: [address] [place] [address] [place] [address] [phone]         [facility] Follow up in 2 week(s).   Contact information: [address] [place] Hospital Course by problem list: 1. Chronic left lower extremity ischemia: Patient presented from her ALF with complaints of left leg pain. While in the ED, her leg was noted to be cool to the touch, impaired sensation. She was evaluated with [place] ABI and found to have decreased blood flow to the lower extremity. Vascular surgery evaluated her and planned for aortogram on the 22nd and she was started on pain control. However patient reported that her pain improved, and she and her daughter decided not to pursue further testing due to her advanced age and frailty. No further procedures were planned and she was deemed medically stable for discharge.   Iron deficiency anemia: Patient was noted to be anemic on admission. Iron studies were ordered which returned showing iron deficiency anemia. She was started on iron supplementation.   She was continued on her other home medications for her chronic medical problems.  Discharge Exam:   BP 121/70 (BP Location: Right  Arm)   Pulse 79   Temp 97.7 F (36.5 C) (Oral)   Resp 19   Ht 5\' 8"  (1.727 m)   Wt 67.9 kg   SpO2 99%   BMI 22.76 kg/m  Discharge exam: Physical Exam HENT:     Head: Atraumatic.  Cardiovascular:     Rate and Rhythm: Normal rate and regular rhythm.     Comments: Decreased pulse left lower extremity Abdominal:     General: Abdomen is flat. Bowel sounds are normal.     Palpations: Abdomen is soft.  Musculoskeletal:     Comments: Contracted left upper extremity  Skin:    General: Skin is warm and dry.     Comments: Erythema and cool to the touch of left lower extremity  Neurological:     Mental Status: She is alert. Mental status is at baseline.     Sensory: Sensory deficit present.     Comments: Decreased sensation dorsum of left foot  Psychiatric:  Mood and Affect: Mood normal.     Pertinent Labs, Studies, and Procedures:  Venous US: FINDINGS: Contralateral Common Femoral Vein: Respiratory phasicity is normal and symmetric with the symptomatic side. No evidence of thrombus. Normal compressibility. Common Femoral Vein: No evidence of thrombus. Normal compressibility, respiratory phasicity and response to augmentation. Saphenofemoral Junction: No evidence of thrombus. Normal compressibility and flow on color Doppler imaging. Profunda Femoral Vein: No evidence of thrombus. Normal compressibility and flow on color Doppler imaging. Femoral Vein: No evidence of thrombus. Normal compressibility, respiratory phasicity and response to augmentation. Popliteal Vein: No evidence of thrombus. Normal compressibility, respiratory phasicity and response to augmentation. Calf Veins: Limited evaluation. Other Findings:  None. IMPRESSION: Negative for deep venous thrombosis in left lower extremity. Limited evaluation of left deep calf veins.  Vas [place] ABI w/wo TBI: Summary:  Right:  Unable to obtain ABI due to pt pain tolerance.  Left: Unable to obtain ABI due to pt pain  tolerance.    Discharge Instructions: You were brought to [facility] due to left leg pain. You were admitted to the hospital because we were concerned about poor blood flow to you leg. The vascular surgeon planned to further evaluate the blood flow to your leg with an aortogram, however, we managed to control your pain, and after discussions with you and your daughter, it was decided not to pursue this procedure.   Take 1 Norco tablet every 6 hours as needed for pain control. Please follow up with your primary care physician in 1 week. Please follow up with the vascular surgeon as an outpatient in 2 weeks.  Signed: Delene Ruffini, MD 09/03/2020, 10:10 AM   Pager: @[phone]

## 2020-09-02 NOTE — Progress Notes (Addendum)
     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  This morning she tells me that her foot does not hurt and she does not want any procedure done.  I have had a long discussion with the patient and also her daughter over the phone who will discuss this with her later.  Now I will leave her on the schedule.  However if the patient and her daughter decide not to proceed which I think is reasonable given her age and debilitated state and certainly we will cancel her procedure tomorrow.   SUBJECTIVE:   Denies significant pain in the left leg.  States that she definitively does not want to have a procedure tomorrow.  PHYSICAL EXAM:   Vitals:   09/01/20 1546 09/01/20 2024 09/02/20 0751 09/02/20 0812  BP: (!) 144/60 (!) 135/47 106/78   Pulse: 74 75 (!) 147 85  Resp: 17 16    Temp: 97.9 F (36.6 C) 98.7 F (37.1 C)  97.7 F (36.5 C)  TempSrc: Oral Oral    SpO2: 100% 97% 98% 100%  Weight:      Height:       The left foot is slightly cooler than the right but has adequate temperature.  Currently it is viable.  LABS:   Lab Results  Component Value Date   WBC 5.9 09/01/2020   HGB 10.9 (L) 09/01/2020   HCT 33.6 (L) 09/01/2020   MCV 89.6 09/01/2020   PLT 182 09/01/2020   Lab Results  Component Value Date   CREATININE 1.04 (H) 09/01/2020   Lab Results  Component Value Date   INR 1.1 08/31/2020    PROBLEM LIST:    Active Problems:   Lower limb ischemia   CURRENT MEDS:    benazepril  10 mg Oral Daily   ferrous sulfate  325 mg Oral BID WC   levothyroxine  50 mcg Oral Q0600   multivitamin with minerals  1 tablet Oral Daily   pravastatin  20 mg Oral q1800   vitamin B-12  1,000 mcg Oral Daily    Deitra Mayo Office:  [phone] 09/02/2020   Patient has chronic ischemia of the left lower extremity. NPO past MN for angiogram 09/03/20 with DR. Scot Dock.  Roxy Horseman PA-C

## 2020-09-02 NOTE — Progress Notes (Signed)
Received a call from dtr Beverlee Nims, and per daughter they decided not to proceed with the procedure tomorrow.

## 2020-09-02 NOTE — Evaluation (Signed)
Physical Therapy Evaluation Patient Details Name: Amber [name] MRN: [id number] DOB: 31-Oct-1932 Today's Date: 09/02/2020   History of Present Illness  Pt is an 87 y/o female admitted 7/19 with c/o L leg pain.  Workup found signs of L leg ischemic changes.  Pt and her daughter decided not to pursue a planned aortogram and to return to [place].  No other procedures planned.  PMHx:  HTN, sarcoma L Leg, throid ds, COVID  Clinical Impression  Pt is at or close to baseline functioning and should be safe at [facility] at the level of assist that has been provided.. There are no further acute PT needs.  Will sign off at this time.     Follow Up Recommendations No PT follow up;Supervision/Assistance - 24 hour    Equipment Recommendations  None recommended by PT    Recommendations for Other Services       Precautions / Restrictions Precautions Precautions: Fall      Mobility  Bed Mobility Overal bed mobility: Needs Assistance Bed Mobility: Supine to Sit;Sit to Supine     Supine to sit: Max assist;HOB elevated Sit to supine: Max assist   General bed mobility comments: pt agreed to movement, then almost resistant to transition to EOB.  Pt refused to move further than feet on the floor.    Transfers                 General transfer comment: pt deferred, "I Can't"  Ambulation/Gait             General Gait Details: not able.  Stairs            Wheelchair Mobility    Modified Rankin (Stroke Patients Only)       Balance                                             Pertinent Vitals/Pain Pain Assessment: Faces Faces Pain Scale: Hurts even more Pain Location: L LE with movement Pain Intervention(s): Monitored during session;Repositioned    Home Living Family/patient expects to be discharged to:: Assisted living               Home Equipment: Schauf - 2 wheels;Cane - single point;Wheelchair - manual      Prior Function Level  of Independence: Needs assistance         Comments: pt reports she needs assistance for all ADL's and transfers to a w/c where she stays during the day for easy transport to the bsc if needed.     Hand Dominance        Extremity/Trunk Assessment   Upper Extremity Assessment Upper Extremity Assessment: Generalized weakness    Lower Extremity Assessment Lower Extremity Assessment: Generalized weakness    Cervical / Trunk Assessment Cervical / Trunk Assessment: Kyphotic  Communication   Communication: No difficulties  Cognition Arousal/Alertness: Awake/alert Behavior During Therapy: Anxious Overall Cognitive Status: No family/caregiver present to determine baseline cognitive functioning                                        General Comments General comments (skin integrity, edema, etc.): vss overall..  Pt anxious about participating throughout.  Agreed then cried out with movement of L LE, but continued to move that leg herself  without signs of pain.  pt was increasingly more anxious about doing anything further and wished to stop everything.    Exercises     Assessment/Plan    PT Assessment Patent does not need any further PT services (pt is likely at baseline function and could best be managed with nursing assist or restorative care at this time.)  PT Problem List Decreased strength;Decreased mobility;Decreased cognition;Pain;Decreased activity tolerance;Decreased balance       PT Treatment Interventions      PT Goals (Current goals can be found in the Care Plan section)  Acute Rehab PT Goals Patient Stated Goal: I want to go home PT Goal Formulation: All assessment and education complete, DC therapy    Frequency     Barriers to discharge        Co-evaluation               AM-PAC PT "6 Clicks" Mobility  Outcome Measure Help needed turning from your back to your side while in a flat bed without using bedrails?: A Lot Help needed  moving from lying on your back to sitting on the side of a flat bed without using bedrails?: A Lot Help needed moving to and from a bed to a chair (including a wheelchair)?: Total Help needed standing up from a chair using your arms (e.g., wheelchair or bedside chair)?: Total Help needed to walk in hospital room?: Total Help needed climbing 3-5 steps with a railing? : Total 6 Click Score: 8    End of Session   Activity Tolerance: Patient limited by pain Patient left: in bed;with bed alarm set;with call bell/phone within reach Nurse Communication: Mobility status PT Visit Diagnosis: Pain;Other abnormalities of gait and mobility (R26.89) Pain - part of body: Leg    Time: 1729-1750 PT Time Calculation (min) (ACUTE ONLY): 21 min   Charges:   PT Evaluation $PT Eval Moderate Complexity: 1 Mod          09/02/2020  Ginger Carne., PT Acute Rehabilitation Services [phone]  (pager) [phone]  (office)  Tessie Fass Kyann Heydt 09/02/2020, 6:07 PM

## 2020-09-02 NOTE — Progress Notes (Signed)
Patient placed in soft mit restraints due to pulling out her IV and multiple attempts to pull out the new IV placed by the IV team in which her heparin is infusing.  Lab was unsuccessful in drawing AM labs, due to the patient being non cooperative, and refusal. Will attempt to draw APTT again this morning.

## 2020-09-02 NOTE — Discharge Instructions (Addendum)
Dear Amber [name],   You were brought to [facility] due to left leg pain. You were admitted to the hospital because we were concerned about poor blood flow to you leg. The vascular surgeon planned to further evaluate the blood flow to your leg with an aortogram, however, we managed to control your pain, and after discussions with you and your daughter, it was decided not to pursue this procedure.   Take 1 Norco tablet every 6 hours as needed for pain control. Please follow up with your primary care physician in 1 week. Please follow up with the vascular surgeon as an outpatient in 2 weeks.

## 2020-09-02 NOTE — Plan of Care (Signed)
  Problem: Education: Goal: Knowledge of General Education information will improve Description: Including pain rating scale, medication(s)/side effects and non-pharmacologic comfort measures Outcome: Progressing   Problem: Health Behavior/Discharge Planning: Goal: Ability to manage health-related needs will improve Outcome: Progressing   Problem: Clinical Measurements: Goal: Ability to maintain clinical measurements within normal limits will improve Outcome: Progressing Goal: Will remain free from infection Outcome: Progressing Goal: Diagnostic test results will improve Outcome: Progressing Goal: Respiratory complications will improve Outcome: Progressing Goal: Cardiovascular complication will be avoided Outcome: Progressing   Problem: Safety: Goal: Ability to remain free from injury will improve Outcome: Progressing   Problem: Pain Managment: Goal: General experience of comfort will improve Outcome: Progressing   Problem: Skin Integrity: Goal: Risk for impaired skin integrity will decrease Outcome: Progressing

## 2020-09-02 NOTE — Plan of Care (Signed)
  Problem: Education: Goal: Knowledge of General Education information will improve Description Including pain rating scale, medication(s)/side effects and non-pharmacologic comfort measures Outcome: Progressing   Problem: Health Behavior/Discharge Planning: Goal: Ability to manage health-related needs will improve Outcome: Progressing   

## 2020-09-02 NOTE — Care Management Important Message (Signed)
Important Message  Patient Details  Name: Amber [name] MRN: [id number] Date of Birth: 1933-01-09   Medicare Important Message Given:  Yes     Orbie Pyo 09/02/2020, 4:24 PM

## 2020-09-02 NOTE — Progress Notes (Signed)
ANTICOAGULATION CONSULT NOTE - Follow Up Consult  Pharmacy Consult for IV Heparin Indication:  Limb ischemia  No Known Allergies  Patient Measurements: Height: 5\' 8"  (172.7 cm) Weight: 67.9 kg (149 lb 11.1 oz) IBW/kg (Calculated) : 63.9 Heparin Dosing Weight: 67.9 kg  Vital Signs: Temp: 98.2 F (36.8 C) (07/21 2000) Temp Source: Oral (07/21 2000) BP: 130/81 (07/21 2000) Pulse Rate: 87 (07/21 2000)  Labs: Recent Labs    08/31/20 0102 08/31/20 0828 08/31/20 1643 09/01/20 0158 09/01/20 1518 09/02/20 0747 09/02/20 2012  HGB 10.2*  --   --  10.9*  --   --   --   HCT 32.1*  --   --  33.6*  --   --   --   PLT 184  --   --  182  --   --   --   APTT  --  36  --   --   --   --   --   LABPROT 14.1  --   --   --   --   --   --   INR 1.1  --   --   --   --   --   --   HEPARINUNFRC  --   --    < > <0.10* 0.67 >1.10* 0.85*  CREATININE 1.03*  --   --  1.04*  --   --   --    < > = values in this interval not displayed.    Estimated Creatinine Clearance: 39.2 mL/min (A) (by C-G formula based on SCr of 1.04 mg/dL (H)).  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 Pharmacy was consulted to dose IV heparin for LL limb ischemia; pt was on no anticoagulants PTA.   Heparin level to 0.85  Goal of Therapy:  Heparin level 0.3-0.7 units/ml Monitor platelets by anticoagulation protocol: Yes   Plan:  Decrease heparin to 900 units / hr. - > can likely stop Monitor daily heparin level, CBC Monitor for bleeding  Thank you Anette Guarneri, PharmD September 02, 2020

## 2020-09-02 NOTE — Progress Notes (Addendum)
Order noted for d/c back to ALF. Patient with PT/OT  evaluations pending. NCM called  [facility] [phone]) to speak with nurse regarding d/c plan, nurse not available. Amend assistant stated she will have nurse to call NCM in am. States unable to receive pt this evening. TOC team will continue to monitor and assist with needs. Whitman Hero RN,CM

## 2020-09-02 NOTE — Progress Notes (Signed)
HD#2 SUBJECTIVE:  Patient Summary: Amber [name] is a 87 y.o. with a pertinent PMH of HTN, HLD, sarcoma of the left leg (treated in 1989), and hypothyroidism, who presented with left leg pain and admitted for left lower limb ischemia.   Overnight Events: no acute events overnight    Interm History: Patient seen and evaluated at bedside. She reports that she is doing well this morning. Not complaining of pain at this time. Patient had discussed with her daughter and decided not to pursue aortogram tomorrow.  OBJECTIVE:  Vital Signs: Vitals:   08/31/20 1951 09/01/20 0740 09/01/20 1546 09/01/20 2024  BP: (!) 170/75 (!) 144/69 (!) 144/60 (!) 135/47  Pulse: 71 89 74 75  Resp: 20 16 17 16   Temp: 98.4 F (36.9 C) 98.3 F (36.8 C) 97.9 F (36.6 C) 98.7 F (37.1 C)  TempSrc:  Oral Oral Oral  SpO2: 100% 100% 100% 97%  Weight:      Height:       Supplemental O2: Room Air SpO2: 97 % O2 Flow Rate (L/min): 0 L/min  Filed Weights   08/31/20 0603 08/31/20 0725  Weight: 66.9 kg 67.9 kg     Intake/Output Summary (Last 24 hours) at 09/02/2020 0708 Last data filed at 09/02/2020 0458 Gross per 24 hour  Intake 524.63 ml  Output 1020 ml  Net -495.37 ml    Net IO Since Admission: -1,795.37 mL [09/02/20 0708]  Physical Exam: Physical Exam HENT:     Head: Atraumatic.  Cardiovascular:     Rate and Rhythm: Normal rate and regular rhythm.     Comments: Decreased pulse left lower extremity Musculoskeletal:     Cervical back: Neck supple.     Comments: Surgical scar of left lower extremity, erythema, cool to the touch, minimal swelling extending from left mid-calf to foot. Able to move toes. Sensation intact  Skin:    General: Skin is dry.  Neurological:     General: No focal deficit present.     Mental Status: She is alert and oriented to person, place, and time.     Comments: Tangential thought    Patient Lines/Drains/Airways Status     Active Line/Drains/Airways     Name  Placement date Placement time Site Days   Peripheral IV 08/31/20 22 G Distal;Right;Lateral Forearm 08/31/20  1120  Forearm  1   Peripheral IV 08/31/20 22 G 1.75" Anterior;Right Forearm 08/31/20  1503  Forearm  1   Incision (Closed) 01/20/16 Hip Right 01/20/16  0928  -- 1686   Incision (Closed) 10/20/17 Hip Left 10/20/17  0909  -- 1047            Pertinent Labs: CBC Latest Ref Rng & Units 09/01/2020 08/31/2020 07/16/2020  WBC 4.0 - 10.5 K/uL 5.9 4.7 10.4  Hemoglobin 12.0 - 15.0 g/dL 10.9(L) 10.2(L) 13.0  Hematocrit 36.0 - 46.0 % 33.6(L) 32.1(L) 39.9  Platelets 150 - 400 K/uL 182 184 241    CMP Latest Ref Rng & Units 09/01/2020 08/31/2020 07/17/2020  Glucose 70 - 99 mg/dL 88 90 78  BUN 8 - 23 mg/dL 12 13 18   Creatinine 0.44 - 1.00 mg/dL 1.04(H) 1.03(H) 0.89  Sodium 135 - 145 mmol/L 138 142 140  Potassium 3.5 - 5.1 mmol/L 4.3 4.2 4.3  Chloride 98 - 111 mmol/L 106 109 115(H)  CO2 22 - 32 mmol/L 24 27 17(L)  Calcium 8.9 - 10.3 mg/dL 8.7(L) 8.4(L) 8.0(L)  Total Protein 6.5 - 8.1 g/dL - - -  Total Bilirubin 0.3 - 1.2 mg/dL - - -  Alkaline Phos 38 - 126 U/L - - -  AST 15 - 41 U/L - - -  ALT 0 - 44 U/L - - -    No results for input(s): GLUCAP in the last 72 hours.   Pertinent Imaging: No results found.  ASSESSMENT/PLAN:  Assessment: Active Problems:   Lower limb ischemia   Amber [name] is a 87 y.o. with a pertinent PMH of HTN, HLD, sarcoma of the left leg (treated in 1989), and hypothyroidism, who presented with left leg pain and admitted for left lower limb ischemia on hospital day 2  Plan: Chronic Left Lower Extremity Ischemia:  Leg pain for at least 1 week in duration.  On physical examination both lower extremities are equally round in circumference, the left lower extremity is cool to the touch, has mild paresthesia of the dorsum of the affected appendage with no PD pulse.  Lower left extremity does not have skin changes, capillary refill is sluggish.  Patient able to lift  left foot from the bed with some difficulty.  No signs of ulceration, gangrene, or infection at this time.  White count is unremarkable and there no signs of infection.  These signs are concerning for limb ischemia. Patient seen by vascular who states that this pain is likely chronic in nature. DVT studies were performed with no visible clot.  Unable to complete ABI secondary to contracted left upper extremity and pain, but preliminary results show absent DP pulse with a dampened monophasic PTA.  Patient likely has underlying PAD with attributing risk factors of advanced age, hyperlipidemia, hypertension, previous smoking history. Patient and daughter decided against pursuing aortogram. No further workup planned at this time. Patient is medically stable for discharge at this time.  - Follow up with PT recs. Will discharge back to ALF tomorrow.    - Norco 5-325 mg tablet every 6 hours as needed for pain control - follow up with vascular surgery as outpatient.    Hypothyroidism Patient on home dose of Synthroid 50 MCG daily.  Last TSH performed on 07/17/2020 was within normal limits. - Continue home Synthroid 50 MCG daily   Hyperlipidemia:  Patient currently on low intensity statin. Lipid panel normal - Continue home lovastatin 20 mg nightly - Lipid panel   Hypertension:  Patient with a history of hypertension on home benazepril 20 mg daily.  Pressures elevated with systolics in the 287G to 811X, patient is unsure if he took her medications today.  She is currently asymptomatic we will start back her home medication. BP's elevated today - Continue benazepril 20 mg daily   Sarcoma of the Left Lower Extremity:  Diagnosed in 1989. Patient treated with chemotherapy and surgical excision with no recurrence.  Stable   Iron deficiency Anemia:  Patient with a past medical history of vitamin B12 deficiency presents with normocytic anemia.  Patiently currently on oral supplementation for the B12. Hemoglobin  today of 10.2 with an MCV of 90.4. Patient currently asymptomatic. - Ferritin 30 - Vitamin B12 554 - Folate 9.0 - Iron panel 47 - saturation ratio 15 - Replace iron  Best Practice: Diet: Regular diet IVF: Fluids: none, Rate: None VTE:  Code: DNR AB: none   Signature: Delene Ruffini, MD  Internal Medicine Resident, PGY-1 [facility]  Pager: [phone] 7:08 AM, 09/02/2020   Please contact the on call pager after 5 pm and on weekends at [phone].

## 2020-09-02 NOTE — Progress Notes (Addendum)
ANTICOAGULATION CONSULT NOTE - Follow Up Consult  Pharmacy Consult for IV Heparin Indication:  Limb ischemia  No Known Allergies  Patient Measurements: Height: 5\' 8"  (172.7 cm) Weight: 67.9 kg (149 lb 11.1 oz) IBW/kg (Calculated) : 63.9 Heparin Dosing Weight: 67.9 kg  Vital Signs: Temp: 97.7 F (36.5 C) (07/21 0812) BP: 106/78 (07/21 0751) Pulse Rate: 85 (07/21 0812)  Labs: Recent Labs    08/31/20 7619 08/31/20 0828 08/31/20 1643 09/01/20 0158 09/01/20 1518 09/02/20 0747  HGB 10.2*  --   --  10.9*  --   --   HCT 32.1*  --   --  33.6*  --   --   PLT 184  --   --  182  --   --   APTT  --  36  --   --   --   --   LABPROT 14.1  --   --   --   --   --   INR 1.1  --   --   --   --   --   HEPARINUNFRC  --   --    < > <0.10* 0.67 >1.10*  CREATININE 1.03*  --   --  1.04*  --   --    < > = values in this interval not displayed.    Estimated Creatinine Clearance: 39.2 mL/min (A) (by C-G formula based on SCr of 1.04 mg/dL (H)).  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 Pharmacy was consulted to dose IV heparin for LL limb ischemia; pt was on no anticoagulants PTA.   Heparin Level: >1.10, above goal Lab drawn in opposite hand from heparin infusion  Nurse notes no s/s of bleeding.  Goal of Therapy:  Heparin level 0.3-0.7 units/ml Monitor platelets by anticoagulation protocol: Yes   Plan:  Hold heparin gtt X 1 hour. Restart at 1100 units/hr. Recheck heparin level in 8 hours. Monitor daily heparin level, CBC Monitor for bleeding  Levonne Spiller, PharmD PGY1 Acute Care Resident  September 02, 2020

## 2020-09-03 DIAGNOSIS — D649 Anemia, unspecified: Secondary | ICD-10-CM | POA: Diagnosis not present

## 2020-09-03 DIAGNOSIS — I10 Essential (primary) hypertension: Secondary | ICD-10-CM | POA: Diagnosis not present

## 2020-09-03 DIAGNOSIS — E785 Hyperlipidemia, unspecified: Secondary | ICD-10-CM | POA: Diagnosis not present

## 2020-09-03 DIAGNOSIS — E039 Hypothyroidism, unspecified: Secondary | ICD-10-CM | POA: Diagnosis not present

## 2020-09-03 LAB — CBC
HCT: 33.1 % — ABNORMAL LOW (ref 36.0–46.0)
Hemoglobin: 11.2 g/dL — ABNORMAL LOW (ref 12.0–15.0)
MCH: 29.9 pg (ref 26.0–34.0)
MCHC: 33.8 g/dL (ref 30.0–36.0)
MCV: 88.3 fL (ref 80.0–100.0)
Platelets: 201 10*3/uL (ref 150–400)
RBC: 3.75 MIL/uL — ABNORMAL LOW (ref 3.87–5.11)
RDW: 13.6 % (ref 11.5–15.5)
WBC: 5.3 10*3/uL (ref 4.0–10.5)
nRBC: 0 % (ref 0.0–0.2)

## 2020-09-03 LAB — BASIC METABOLIC PANEL
Anion gap: 10 (ref 5–15)
BUN: 15 mg/dL (ref 8–23)
CO2: 23 mmol/L (ref 22–32)
Calcium: 8.7 mg/dL — ABNORMAL LOW (ref 8.9–10.3)
Chloride: 106 mmol/L (ref 98–111)
Creatinine, Ser: 1.08 mg/dL — ABNORMAL HIGH (ref 0.44–1.00)
GFR, Estimated: 50 mL/min — ABNORMAL LOW (ref >60)
Glucose, Bld: 102 mg/dL — ABNORMAL HIGH (ref 70–99)
Potassium: 3.7 mmol/L (ref 3.5–5.1)
Sodium: 139 mmol/L (ref 135–145)

## 2020-09-03 LAB — HEPARIN LEVEL (UNFRACTIONATED): Heparin Unfractionated: 0.76 IU/mL — ABNORMAL HIGH (ref 0.30–0.70)

## 2020-09-03 SURGERY — ABDOMINAL AORTOGRAM W/LOWER EXTREMITY
Anesthesia: LOCAL

## 2020-09-03 MED ORDER — FERROUS SULFATE 325 (65 FE) MG PO TABS: 325.0000 mg | ORAL_TABLET | ORAL | 0 refills | Status: AC

## 2020-09-03 MED ORDER — HYDROCODONE-ACETAMINOPHEN 5-325 MG PO TABS: 1.0000 | ORAL_TABLET | Freq: Four times a day (QID) | ORAL | 0 refills | Status: AC | PRN

## 2020-09-03 MED ORDER — FERROUS SULFATE 325 (65 FE) MG PO TABS: 325.0000 mg | ORAL_TABLET | ORAL | 0 refills | Status: DC

## 2020-09-03 NOTE — Evaluation (Signed)
Occupational Therapy Evaluation Patient Details Name: Amber [name] MRN: [id number] DOB: 1932/08/29 Today's Date: 09/03/2020    History of Present Illness Pt is an 87 y/o female admitted 7/19 with c/o L leg pain.  Workup found signs of L leg ischemic changes.  Pt and her daughter decided not to pursue a planned aortogram and to return to [place].  No other procedures planned.  PMHx:  HTN, sarcoma L Leg, throid ds, COVID   Clinical Impression   Patient resides at [facility], states she has help for all ADLs, does not walk and has assist with transfers into wheelchair and to bedside commode. Patient needing max A to sit upright at edge of bed, self limiting "I need to lay down" but would not elaborate why. Asked if dizzy or in pain repeats "I just need to lay down." Patient appears at baseline, no further acute OT needs, will sign off.     Follow Up Recommendations  Other (comment) (return to [facility], increased staff assist as needed)    Equipment Recommendations  None recommended by OT       Precautions / Restrictions Precautions Precautions: Fall Restrictions Weight Bearing Restrictions: No      Mobility Bed Mobility Overal bed mobility: Needs Assistance Bed Mobility: Supine to Sit;Sit to Supine     Supine to sit: Max assist;HOB elevated Sit to supine: Max assist   General bed mobility comments: patient presents with posterior lean sitting up at edge of bed and repeatedly stating "I need to lay down" attempt to have patient scoot hips evenly at edge of bed and use UEs as able to assist with balance however resistive. when asked whats wrong sitting up patient repeats "I just need to lay down"    Transfers                 General transfer comment: pt deferred, "I Can't"    Balance Overall balance assessment: Needs assistance Sitting-balance support: Feet supported;Single extremity supported Sitting balance-Leahy Scale: Zero Sitting balance - Comments: max A                                    ADL either performed or assessed with clinical judgement   ADL Overall ADL's : At baseline                                       General ADL Comments: patient reports needing assistance for all ADLs at baseline, when trying to glean what she does at [facility] states "someone always helps me"      Pertinent Vitals/Pain Pain Assessment: Faces Faces Pain Scale: Hurts even more Pain Location: generalized with movement Pain Descriptors / Indicators: Grimacing;Guarding Pain Intervention(s): Limited activity within patient's tolerance     Hand Dominance Right   Extremity/Trunk Assessment Upper Extremity Assessment Upper Extremity Assessment: Generalized weakness;LUE deficits/detail LUE Deficits / Details: L UE guarding and appears to have moderate tone states "this arm doesn't work well" keeps in flexed posture   Lower Extremity Assessment Lower Extremity Assessment: Defer to PT evaluation   Cervical / Trunk Assessment Cervical / Trunk Assessment: Kyphotic   Communication Communication Communication: HOH   Cognition Arousal/Alertness: Awake/alert Behavior During Therapy: Anxious Overall Cognitive Status: No family/caregiver present to determine baseline cognitive functioning  General Comments: unsure of patient's baseline mentation, when provided 1 step directions patient replies "I can't do that"              Home Living Family/patient expects to be discharged to:: Assisted living                             Home Equipment: Bramble - 2 wheels;Cane - single point;Wheelchair - manual          Prior Functioning/Environment Level of Independence: Needs assistance        Comments: pt reports she needs assistance for all ADL's and transfers to a w/c where she stays during the day for easy transport to the bsc if needed.        OT Problem List:  Pain         OT Goals(Current goals can be found in the care plan section) Acute Rehab OT Goals Patient Stated Goal: I want to go home OT Goal Formulation: All assessment and education complete, DC therapy   AM-PAC OT "6 Clicks" Daily Activity     Outcome Measure Help from another person eating meals?: A Lot Help from another person taking care of personal grooming?: A Lot Help from another person toileting, which includes using toliet, bedpan, or urinal?: Total Help from another person bathing (including washing, rinsing, drying)?: Total Help from another person to put on and taking off regular upper body clothing?: Total Help from another person to put on and taking off regular lower body clothing?: Total 6 Click Score: 8   End of Session Nurse Communication: Mobility status  Activity Tolerance: Patient limited by fatigue Patient left: in bed;with call bell/phone within reach;with bed alarm set  OT Visit Diagnosis: Pain Pain - Right/Left: Left Pain - part of body: Leg (generalized)                Time: [id number] OT Time Calculation (min): 16 min Charges:  OT General Charges $OT Visit: 1 Visit OT Evaluation $OT Eval Low Complexity: [place] OT OT pager: [phone]  Rosemary Holms 09/03/2020, 9:27 AM

## 2020-09-03 NOTE — Progress Notes (Signed)
Pt transported to [facility] via [facility] in stable condition and voicing no complaints. Discharge packet with [facility] for facility.

## 2020-09-03 NOTE — TOC Transition Note (Signed)
Transition of Care [facility]) - CM/SW Discharge Note   Patient Details  Name: [name] MRN: [id number] Date of Birth: 11-03-1932  [facility] of [facility]) CM/SW Contact:  Milinda Antis, [place] Phone Number: 09/03/2020, 11:34 AM   Clinical Narrative:    Patient will DC to: [facility] Anticipated DC date: 09/03/2020 Family notified: Yes  Transport by: Corey Harold   Per MD patient ready for DC to the assisted living facility. RN to call report prior to discharge [phone]. RN, patient, patient's family, and facility notified of DC. Discharge Summary and FL2 sent to facility. DC packet on chart. Ambulance transport will be requested for patient when RN reports that the patient is available.   CSW will sign off for now as social work intervention is no longer needed. Please consult [place] again if new needs arise.     Final next level of care: Assisted Living Barriers to Discharge: Barriers Resolved   Patient Goals and CMS Choice Patient states their goals for this hospitalization and ongoing recovery are:: To get back to facility with her husband.      Discharge Placement                Patient to be transferred to facility by: [facility] Name of family member notified: Donzetta Kohut (Daughter)   [phone] Patient and family notified of of transfer: 09/03/20  Discharge Plan and Services                                     Social Determinants of Health (SDOH) Interventions     Readmission Risk Interventions No flowsheet data found.

## 2020-09-03 NOTE — Plan of Care (Signed)
  Problem: Education: Goal: Knowledge of General Education information will improve Description: Including pain rating scale, medication(s)/side effects and non-pharmacologic comfort measures Outcome: Adequate for Discharge   Problem: Health Behavior/Discharge Planning: Goal: Ability to manage health-related needs will improve Outcome: Adequate for Discharge   Problem: Clinical Measurements: Goal: Ability to maintain clinical measurements within normal limits will improve Outcome: Adequate for Discharge Goal: Will remain free from infection Outcome: Adequate for Discharge Goal: Diagnostic test results will improve Outcome: Adequate for Discharge Goal: Respiratory complications will improve Outcome: Adequate for Discharge Goal: Cardiovascular complication will be avoided Outcome: Adequate for Discharge   Problem: Activity: Goal: Risk for activity intolerance will decrease Outcome: Adequate for Discharge   Problem: Nutrition: Goal: Adequate nutrition will be maintained Outcome: Adequate for Discharge   Problem: Coping: Goal: Level of anxiety will decrease Outcome: Adequate for Discharge   Problem: Elimination: Goal: Will not experience complications related to bowel motility Outcome: Adequate for Discharge Goal: Will not experience complications related to urinary retention Outcome: Adequate for Discharge   Problem: Pain Managment: Goal: General experience of comfort will improve Outcome: Adequate for Discharge   Problem: Safety: Goal: Ability to remain free from injury will improve Outcome: Adequate for Discharge   Problem: Skin Integrity: Goal: Risk for impaired skin integrity will decrease Outcome: Adequate for Discharge    Pt discharging back to her assisted living facility. Will resume care as prior to admit.  Pt's family has elected not to do any procedures to LLE at this time. Continue plan of care.

## 2020-09-03 NOTE — NC FL2 (Signed)
[place] LEVEL OF CARE SCREENING TOOL     IDENTIFICATION  Patient Name: Amber [name] Birthdate: [birth year] Sex: female Admission Date (Current Location): 08/31/2020  [facility] and [place] Number:  Herbalist and Address:  [facility], [place] [address] [place], [place] [address]      Provider Number: [id number]  Attending Physician Name and Address:  Lucious Groves, DO  Relative Name and Phone Number:  Donzetta Kohut (Daughter)   [phone]    Current Level of Care: Hospital Recommended Level of Care: Assisted Living Facility Prior Approval Number:    Date Approved/Denied:   PASRR Number:    Discharge Plan: Other (Comment) (ALF)    Current Diagnoses: Patient Active Problem List   Diagnosis Date Noted   Lower limb ischemia 08/31/2020   Syncope and collapse 07/15/2020   hx of COVID-19 07/15/2020   AKI (acute kidney injury) ([place]) 07/15/2020   Proximal Femur Fracture, left [place] Fall 10/19/2017   Femur fracture ([place]) 10/19/2017   Dizziness 01/19/2016   Fall 01/19/2016   Closed right hip fracture ([place]) 01/19/2016   Hypertension 01/19/2016   Hypothyroidism 01/19/2016   Hyperlipidemia 01/19/2016   Hip fracture ([place]) 01/19/2016   Status post laparoscopic cholecystectomy 03/27/2013    Orientation RESPIRATION BLADDER Height & Weight     Self  Normal External catheter Weight: 149 lb 11.1 oz (67.9 kg) Height:  '5\' 8"'$  (172.7 cm)  BEHAVIORAL SYMPTOMS/MOOD NEUROLOGICAL BOWEL NUTRITION STATUS      Continent Yes; Fluid consistency: Thin: Patient Communication   AMBULATORY STATUS COMMUNICATION OF NEEDS Skin   Extensive Assist Verbally Normal                       Personal Care Assistance Level of Assistance  Bathing, Feeding, Dressing Bathing Assistance: Maximum assistance Feeding assistance: Limited assistance Dressing Assistance: Maximum assistance     Functional Limitations Info  Sight, Speech, Hearing  Sight Info: Adequate Hearing Info: Adequate Speech Info: Adequate    SPECIAL CARE FACTORS FREQUENCY  PT (By licensed PT), OT (By licensed OT)                    Contractures Contractures Info: Not present    Additional Factors Info  Code Status, Allergies Code Status Info: DNR Allergies Info: No Known Allergies           Current Medications (09/03/2020):  This is the current hospital active medication list Current Facility-Administered Medications  Medication Dose Route Frequency Provider Last Rate Last Admin   acetaminophen (TYLENOL) tablet 650 mg  650 mg Oral Q6H PRN Maudie Mercury, MD       Or   acetaminophen (TYLENOL) suppository 650 mg  650 mg Rectal Q6H PRN Maudie Mercury, MD       benazepril (LOTENSIN) tablet 10 mg  10 mg Oral Daily Maudie Mercury, MD   10 mg at 09/02/20 0941   ferrous sulfate tablet 325 mg  325 mg Oral BID WC Delene Ruffini, MD   325 mg at 09/02/20 1613   HYDROcodone-acetaminophen (NORCO/VICODIN) 5-325 MG per tablet 1 tablet  1 tablet Oral Q6H PRN Maudie Mercury, MD   1 tablet at 09/03/20 [id number]   HYDROmorphone (DILAUDID) injection 0.5 mg  0.5 mg Intravenous Q4H PRN Maudie Mercury, MD   0.5 mg at 09/01/20 2313   levothyroxine (SYNTHROID) tablet 50 mcg  50 mcg Oral Q0600 Maudie Mercury, MD   50 mcg at 09/03/20 [phone]  multivitamin with minerals tablet 1 tablet  1 tablet Oral Daily Maudie Mercury, MD   1 tablet at 09/02/20 0941   pravastatin (PRAVACHOL) tablet 20 mg  20 mg Oral q1800 Maudie Mercury, MD   20 mg at 09/02/20 1700   senna-docusate (Senokot-S) tablet 1 tablet  1 tablet Oral QHS PRN Maudie Mercury, MD       vitamin B-12 (CYANOCOBALAMIN) tablet 1,000 mcg  1,000 mcg Oral Daily Maudie Mercury, MD   1,000 mcg at 09/02/20 0940     Discharge Medications: Legend:                      Inactive    Active    Linked              Medications 09/03/20 09/04/20 09/05/20 09/06/20 09/07/20 09/08/20 09/09/20  benazepril (LOTENSIN)  tablet 10 mg Dose: 10 mg Freq: Daily Route: PO Start: 08/31/20 1245   1000    1000    1000    1000    1000    1000    1000     ferrous sulfate tablet 325 mg Dose: 325 mg Freq: 2 times daily with meals Route: PO Start: 09/02/20 1700   0800   1700    0800   1700    0800   1700    0800   1700    0800   1700    0800   1700    0800   1700     levothyroxine (SYNTHROID) tablet 50 mcg Dose: 50 mcg Freq: Daily Route: PO Start: 09/01/20 0600  Admin Instructions:  Give on an empty stomach, at least 30 minutes before eating.   0603    0600    0600    0600    0600    0600    0600     multivitamin with minerals tablet 1 tablet Dose: 1 tablet Freq: Daily Route: PO Start: 08/31/20 1245   1000    1000    1000    1000    1000    1000    1000     pravastatin (PRAVACHOL) tablet 20 mg Dose: 20 mg Freq: Daily-1800 Route: PO Start: 08/31/20 1800   1800    1800    1800    1800    1800    1800    1800     vitamin B-12 (CYANOCOBALAMIN) tablet 1,000 mcg Dose: 1,000 mcg Freq: Daily Route: PO Start: 08/31/20 1245   1000    1000    1000    1000    1000    1000    1000     Medications 09/03/20 09/04/20 09/05/20 09/06/20 09/07/20 09/08/20     Relevant Imaging Results:  Relevant Lab Results:   Additional Information SSN: [id number]  Paulene Floor Antwion Carpenter, LCSWA

## 2020-09-03 NOTE — Progress Notes (Signed)
Waiting for [facility] to pick pt up to transport back to [place]. Spoke with pt's daughter moments ago on pt's room phone, she is aware pt is waiting on transport.  At this time attempted to call report to [place], phone number given of [phone] went to a voice mail of a staff member of [facility], message left and asked for a return call to unit main number of [phone].

## 2020-09-03 NOTE — Progress Notes (Signed)
VASCULAR SURGERY:  The patient decided against having an arteriogram.  I discussed this with the patient's daughter also yesterday.  Apparently she is being discharged.  I have arranged follow-up in our office in 4 to 6 weeks.    Gae Gallop, MD 3:07 PM

## 2020-09-03 NOTE — Progress Notes (Signed)
ANTICOAGULATION CONSULT NOTE - Follow Up Consult  Pharmacy Consult for IV Heparin Indication:  Limb ischemia  No Known Allergies  Patient Measurements: Height: '5\' 8"'$  (172.7 cm) Weight: 67.9 kg (149 lb 11.1 oz) IBW/kg (Calculated) : 63.9 Heparin Dosing Weight: 67.9 kg  Vital Signs: Temp: 98 F (36.7 C) (07/22 0434) Temp Source: Oral (07/22 0434) BP: 113/73 (07/22 0434) Pulse Rate: 79 (07/22 0434)  Labs: Recent Labs    08/31/20 [id number] 08/31/20 0828 08/31/20 1643 09/01/20 0158 09/01/20 1518 09/02/20 0747 09/02/20 2012 09/03/20 0322  HGB 10.2*  --   --  10.9*  --   --   --  11.2*  HCT 32.1*  --   --  33.6*  --   --   --  33.1*  PLT 184  --   --  182  --   --   --  201  APTT  --  36  --   --   --   --   --   --   LABPROT 14.1  --   --   --   --   --   --   --   INR 1.1  --   --   --   --   --   --   --   HEPARINUNFRC  --   --    < > <0.10*   < > >1.10* 0.85* 0.76*  CREATININE 1.03*  --   --  1.04*  --   --   --  1.08*   < > = values in this interval not displayed.    Estimated Creatinine Clearance: 37.7 mL/min (A) (by C-G formula based on SCr of 1.08 mg/dL (H)).  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 Pharmacy was consulted to dose IV heparin for LL limb ischemia; pt was on no anticoagulants PTA.   Heparin level remains supratherapeutic (0.76) on gtt at 900 units/hr. No bleeding noted. Appears that pt to be discharged soon with no further work-up for limb ischemia.  Goal of Therapy:  Heparin level 0.3-0.7 units/ml Monitor platelets by anticoagulation protocol: Yes   Plan:  Decrease heparin to 800 units/hr Should be able to d/c heparin when IM team rounds  Sherlon Handing, PharmD, BCPS Please see amion for complete clinical pharmacist phone list 09/03/2020 5:36 AM

## 2020-09-10 ENCOUNTER — Telehealth: Payer: Self-pay

## 2020-09-10 NOTE — Telephone Encounter (Signed)
Returned [name]'s call; left VM to call us back if she needs assistance.

## 2020-09-14 ENCOUNTER — Emergency Department (HOSPITAL_COMMUNITY): Payer: Medicare Other

## 2020-09-14 ENCOUNTER — Encounter (HOSPITAL_COMMUNITY): Payer: Self-pay

## 2020-09-14 ENCOUNTER — Emergency Department (HOSPITAL_COMMUNITY)
Admission: EM | Admit: 2020-09-14 | Discharge: 2020-09-14 | Disposition: A | Discharge status: Home / Self Care | Payer: Medicare Other | Source: Home / Self Care | Attending: Emergency Medicine | Admitting: Emergency Medicine

## 2020-09-14 ENCOUNTER — Other Ambulatory Visit: Payer: Self-pay

## 2020-09-14 ENCOUNTER — Observation Stay (HOSPITAL_COMMUNITY)
Admission: EM | Admit: 2020-09-14 | Discharge: 2020-09-16 | Disposition: A | Discharge status: Home / Self Care | Payer: Medicare Other | Attending: Family Medicine | Admitting: Family Medicine

## 2020-09-14 DIAGNOSIS — R531 Weakness: Secondary | ICD-10-CM | POA: Insufficient documentation

## 2020-09-14 DIAGNOSIS — S0992XA Unspecified injury of nose, initial encounter: Secondary | ICD-10-CM | POA: Diagnosis present

## 2020-09-14 DIAGNOSIS — Y900 Blood alcohol level of less than 20 mg/100 ml: Secondary | ICD-10-CM | POA: Insufficient documentation

## 2020-09-14 DIAGNOSIS — I998 Other disorder of circulatory system: Secondary | ICD-10-CM

## 2020-09-14 DIAGNOSIS — Z79899 Other long term (current) drug therapy: Secondary | ICD-10-CM | POA: Insufficient documentation

## 2020-09-14 DIAGNOSIS — Z7982 Long term (current) use of aspirin: Secondary | ICD-10-CM | POA: Insufficient documentation

## 2020-09-14 DIAGNOSIS — S022XXA Fracture of nasal bones, initial encounter for closed fracture: Principal | ICD-10-CM

## 2020-09-14 DIAGNOSIS — Z66 Do not resuscitate: Secondary | ICD-10-CM | POA: Insufficient documentation

## 2020-09-14 DIAGNOSIS — S0011XA Contusion of right eyelid and periocular area, initial encounter: Secondary | ICD-10-CM

## 2020-09-14 DIAGNOSIS — I10 Essential (primary) hypertension: Secondary | ICD-10-CM | POA: Diagnosis present

## 2020-09-14 DIAGNOSIS — G9340 Encephalopathy, unspecified: Secondary | ICD-10-CM | POA: Diagnosis not present

## 2020-09-14 DIAGNOSIS — E039 Hypothyroidism, unspecified: Secondary | ICD-10-CM | POA: Insufficient documentation

## 2020-09-14 DIAGNOSIS — Z87891 Personal history of nicotine dependence: Secondary | ICD-10-CM | POA: Insufficient documentation

## 2020-09-14 DIAGNOSIS — Z96643 Presence of artificial hip joint, bilateral: Secondary | ICD-10-CM | POA: Insufficient documentation

## 2020-09-14 DIAGNOSIS — R918 Other nonspecific abnormal finding of lung field: Secondary | ICD-10-CM

## 2020-09-14 DIAGNOSIS — L03116 Cellulitis of left lower limb: Secondary | ICD-10-CM | POA: Insufficient documentation

## 2020-09-14 DIAGNOSIS — Z8616 Personal history of COVID-19: Secondary | ICD-10-CM | POA: Insufficient documentation

## 2020-09-14 DIAGNOSIS — Z20822 Contact with and (suspected) exposure to covid-19: Secondary | ICD-10-CM | POA: Insufficient documentation

## 2020-09-14 DIAGNOSIS — N182 Chronic kidney disease, stage 2 (mild): Secondary | ICD-10-CM | POA: Insufficient documentation

## 2020-09-14 DIAGNOSIS — Y92009 Unspecified place in unspecified non-institutional (private) residence as the place of occurrence of the external cause: Secondary | ICD-10-CM | POA: Diagnosis not present

## 2020-09-14 DIAGNOSIS — I129 Hypertensive chronic kidney disease with stage 1 through stage 4 chronic kidney disease, or unspecified chronic kidney disease: Secondary | ICD-10-CM | POA: Diagnosis not present

## 2020-09-14 DIAGNOSIS — D649 Anemia, unspecified: Secondary | ICD-10-CM

## 2020-09-14 DIAGNOSIS — I259 Chronic ischemic heart disease, unspecified: Secondary | ICD-10-CM | POA: Insufficient documentation

## 2020-09-14 DIAGNOSIS — R911 Solitary pulmonary nodule: Secondary | ICD-10-CM | POA: Diagnosis not present

## 2020-09-14 DIAGNOSIS — S0511XA Contusion of eyeball and orbital tissues, right eye, initial encounter: Secondary | ICD-10-CM | POA: Diagnosis not present

## 2020-09-14 DIAGNOSIS — Z85828 Personal history of other malignant neoplasm of skin: Secondary | ICD-10-CM | POA: Insufficient documentation

## 2020-09-14 DIAGNOSIS — M62262 Nontraumatic ischemic infarction of muscle, left lower leg: Secondary | ICD-10-CM | POA: Insufficient documentation

## 2020-09-14 DIAGNOSIS — S0993XA Unspecified injury of face, initial encounter: Secondary | ICD-10-CM

## 2020-09-14 DIAGNOSIS — Z79890 Hormone replacement therapy (postmenopausal): Secondary | ICD-10-CM | POA: Diagnosis not present

## 2020-09-14 DIAGNOSIS — I739 Peripheral vascular disease, unspecified: Secondary | ICD-10-CM | POA: Diagnosis not present

## 2020-09-14 DIAGNOSIS — W06XXXA Fall from bed, initial encounter: Secondary | ICD-10-CM | POA: Insufficient documentation

## 2020-09-14 DIAGNOSIS — W19XXXA Unspecified fall, initial encounter: Secondary | ICD-10-CM

## 2020-09-14 DIAGNOSIS — S0003XA Contusion of scalp, initial encounter: Secondary | ICD-10-CM

## 2020-09-14 LAB — COMPREHENSIVE METABOLIC PANEL
ALT: 17 U/L (ref 0–44)
AST: 19 U/L (ref 15–41)
Albumin: 3.3 g/dL — ABNORMAL LOW (ref 3.5–5.0)
Alkaline Phosphatase: 67 U/L (ref 38–126)
Anion gap: 9 (ref 5–15)
BUN: 18 mg/dL (ref 8–23)
CO2: 25 mmol/L (ref 22–32)
Calcium: 7.9 mg/dL — ABNORMAL LOW (ref 8.9–10.3)
Chloride: 108 mmol/L (ref 98–111)
Creatinine, Ser: 0.93 mg/dL (ref 0.44–1.00)
GFR, Estimated: 60 mL/min — ABNORMAL LOW (ref >60)
Glucose, Bld: 91 mg/dL (ref 70–99)
Potassium: 4.6 mmol/L (ref 3.5–5.1)
Sodium: 142 mmol/L (ref 135–145)
Total Bilirubin: 0.7 mg/dL (ref 0.3–1.2)
Total Protein: 7.6 g/dL (ref 6.5–8.1)

## 2020-09-14 LAB — DIFFERENTIAL
Abs Immature Granulocytes: 0.01 10*3/uL (ref 0.00–0.07)
Basophils Absolute: 0 10*3/uL (ref 0.0–0.1)
Basophils Relative: 1 %
Eosinophils Absolute: 0.1 10*3/uL (ref 0.0–0.5)
Eosinophils Relative: 3 %
Immature Granulocytes: 0 %
Lymphocytes Relative: 22 %
Lymphs Abs: 1.1 10*3/uL (ref 0.7–4.0)
Monocytes Absolute: 0.5 10*3/uL (ref 0.1–1.0)
Monocytes Relative: 10 %
Neutro Abs: 3.1 10*3/uL (ref 1.7–7.7)
Neutrophils Relative %: 64 %

## 2020-09-14 LAB — CBC
HCT: 31.6 % — ABNORMAL LOW (ref 36.0–46.0)
HCT: 29.6 % — ABNORMAL LOW (ref 36.0–46.0)
Hemoglobin: 10.2 g/dL — ABNORMAL LOW (ref 12.0–15.0)
Hemoglobin: 9.5 g/dL — ABNORMAL LOW (ref 12.0–15.0)
MCH: 29.7 pg (ref 26.0–34.0)
MCH: 30 pg (ref 26.0–34.0)
MCHC: 32.3 g/dL (ref 30.0–36.0)
MCHC: 32.1 g/dL (ref 30.0–36.0)
MCV: 92.1 fL (ref 80.0–100.0)
MCV: 93.4 fL (ref 80.0–100.0)
Platelets: 239 10*3/uL (ref 150–400)
Platelets: 219 10*3/uL (ref 150–400)
RBC: 3.43 MIL/uL — ABNORMAL LOW (ref 3.87–5.11)
RBC: 3.17 MIL/uL — ABNORMAL LOW (ref 3.87–5.11)
RDW: 14.6 % (ref 11.5–15.5)
RDW: 14.6 % (ref 11.5–15.5)
WBC: 4.8 10*3/uL (ref 4.0–10.5)
WBC: 5.3 10*3/uL (ref 4.0–10.5)
nRBC: 0 % (ref 0.0–0.2)
nRBC: 0 % (ref 0.0–0.2)

## 2020-09-14 LAB — RAPID URINE DRUG SCREEN, HOSP PERFORMED
Amphetamines: NOT DETECTED
Barbiturates: NOT DETECTED
Benzodiazepines: NOT DETECTED
Cocaine: NOT DETECTED
Opiates: NOT DETECTED
Tetrahydrocannabinol: NOT DETECTED

## 2020-09-14 LAB — ETHANOL: Alcohol, Ethyl (B): <10 mg/dL (ref <10)

## 2020-09-14 LAB — URINALYSIS, ROUTINE W REFLEX MICROSCOPIC
Bilirubin Urine: NEGATIVE
Glucose, UA: NEGATIVE mg/dL
Hgb urine dipstick: NEGATIVE
Ketones, ur: NEGATIVE mg/dL
Leukocytes,Ua: NEGATIVE
Nitrite: NEGATIVE
Protein, ur: NEGATIVE mg/dL
Specific Gravity, Urine: 1.006 (ref 1.005–1.030)
pH: 5 (ref 5.0–8.0)

## 2020-09-14 LAB — PROTIME-INR
INR: 1 (ref 0.8–1.2)
Prothrombin Time: 13.2 seconds (ref 11.4–15.2)

## 2020-09-14 LAB — APTT: aPTT: 36 seconds (ref 24–36)

## 2020-09-14 LAB — RESP PANEL BY RT-PCR (FLU A&B, COVID) ARPGX2
Influenza A by PCR: NEGATIVE
Influenza B by PCR: NEGATIVE
SARS Coronavirus 2 by RT PCR: NEGATIVE

## 2020-09-14 NOTE — ED Provider Notes (Signed)
[facility] DEPT Provider Note   CSN: [id number] Arrival date & time: 09/14/20  [id number]     History Chief Complaint  Patient presents with   Altered Mental Status    Amber [name] is a 87 y.o. female.   Altered Mental Status  Patient presents from the nursing facility for confusion disorientation and aggression towards staff.  Patient herself states she is here because of difficulties she has had associated with a rare cancer that she was treated for 30 years ago.  Patient states she has had trouble ever since.  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  Patient denies feeling confused.  She states the staff at the facility are confused.  She denies any trouble with headache.  No fevers.  No abdominal pain.  Past Medical History:  Diagnosis Date   Complication of anesthesia    "woke up jumping and bouncing"   COVID-19    Hyperlipidemia    Hypertension    sarcoma of lt leg dx'd 1989   xrt and chemo and surg   Thyroid disease     Patient Active Problem List   Diagnosis Date Noted   Lower limb ischemia 08/31/2020   Syncope and collapse 07/15/2020   hx of COVID-19 07/15/2020   AKI (acute kidney injury) ([place]) 07/15/2020   Proximal Femur Fracture, left /Mechanical Fall 10/19/2017   Femur fracture ([place]) 10/19/2017   Dizziness 01/19/2016   Fall 01/19/2016   Closed right hip fracture ([place]) 01/19/2016   Hypertension 01/19/2016   Hypothyroidism 01/19/2016   Hyperlipidemia 01/19/2016   Hip fracture ([place]) 01/19/2016   Status post laparoscopic cholecystectomy 03/27/2013    Past Surgical History:  Procedure Laterality Date   ABDOMINAL HYSTERECTOMY     CHOLECYSTECTOMY N/A 03/27/2013   Procedure: LAPAROSCOPIC CHOLECYSTECTOMY WITH INTRAOPERATIVE CHOLANGIOGRAM;  Surgeon: Pedro Earls, MD;  Location: [facility];  Service: General;  Laterality: N/A;   HIP ARTHROPLASTY Right 01/20/2016   Procedure: ARTHROPLASTY  BIPOLAR HIP (HEMIARTHROPLASTY);  Surgeon: Renette Butters, MD;  Location: [facility];  Service: Orthopedics;  Laterality: Right;   HIP ARTHROPLASTY Left 10/20/2017   Procedure: ARTHROPLASTY BIPOLAR HIP (HEMIARTHROPLASTY);  Surgeon: Hiram Gash, MD;  Location: [facility];  Service: Orthopedics;  Laterality: Left;   KNEE SURGERY Left    approx 4 surgeries on left knee     OB History   No obstetric history on file.     Family History  Problem Relation Age of Onset   Lung cancer Sister    Bone cancer Brother    Stomach cancer Sister     Social History   Tobacco Use   Smoking status: Former    Types: Cigarettes    Quit date: 03/27/1983    Years since quitting: 37.4   Smokeless tobacco: Never  Vaping Use   Vaping Use: Never used  Substance Use Topics   Alcohol use: No   Drug use: No    Home Medications Prior to Admission medications   Medication Sig Start Date End Date Taking? Authorizing Provider  aspirin EC 81 MG tablet Take 81 mg by mouth daily.    [provider]  benazepril (LOTENSIN) 20 MG tablet Take 10 mg by mouth daily.    [provider]  ferrous sulfate 325 (65 FE) MG tablet Take 1 tablet (325 mg total) by mouth every other day. 09/03/20 10/03/20  Delene Ruffini, MD  fluticasone (FLONASE) 50 MCG/ACT nasal spray Place  1 spray into both nostrils daily as needed for allergies or rhinitis.    [provider]  levothyroxine (SYNTHROID) 50 MCG tablet Take 50 mcg by mouth daily before breakfast.    [provider]  lovastatin (MEVACOR) 20 MG tablet Take 20 mg by mouth at bedtime. 11/17/15   [provider]  Multiple Vitamin (MULTIVITAMIN WITH MINERALS) TABS tablet Take 1 tablet by mouth daily.    [provider]  Omega-3 Fatty Acids (FISH OIL) 1000 MG CAPS Take 1,000 mg by mouth daily.    [provider]  polyethylene glycol (MIRALAX / GLYCOLAX) packet Take 17 g by mouth daily. Patient taking differently: Take 17 g by  mouth every 12 (twelve) hours as needed (for constipation). 10/24/17   Florencia Reasons, MD  senna-docusate (SENOKOT-S) 8.6-50 MG tablet Take 1 tablet by mouth 2 (two) times daily. 10/23/17   Florencia Reasons, MD  vitamin B-12 (CYANOCOBALAMIN) 1000 MCG tablet Take 1,000 mcg by mouth daily.    [provider]  enoxaparin (LOVENOX) 40 MG/0.4ML injection Inject 0.4 mLs (40 mg total) into the skin daily. Patient not taking: Reported on 03/04/2019 10/21/17 12/11/19  Hiram Gash, MD    Allergies    Patient has no known allergies.  Review of Systems   Review of Systems  All other systems reviewed and are negative.  Physical Exam Updated Vital Signs BP (!) 145/68   Pulse 87   Temp 97.8 F (36.6 C) (Oral)   Resp 15   SpO2 99%   Physical Exam Vitals and nursing note reviewed.  Constitutional:      Appearance: She is well-developed.     Comments: Chronically ill-appearing  HENT:     Head: Normocephalic and atraumatic.     Right Ear: External ear normal.     Left Ear: External ear normal.  Eyes:     General: No scleral icterus.       Right eye: No discharge.        Left eye: No discharge.     Conjunctiva/sclera: Conjunctivae normal.  Neck:     Trachea: No tracheal deviation.  Cardiovascular:     Rate and Rhythm: Normal rate and regular rhythm.  Pulmonary:     Effort: Pulmonary effort is normal. No respiratory distress.     Breath sounds: Normal breath sounds. No stridor. No wheezing or rales.  Abdominal:     General: Bowel sounds are normal. There is no distension.     Palpations: Abdomen is soft.     Tenderness: There is no abdominal tenderness. There is no guarding or rebound.  Musculoskeletal:        General: No tenderness or deformity.     Cervical back: Neck supple.     Comments: Erythema left lower extremity, warm to touch, weak peripheral pulses bilateral lower extrem, left greater than right  Skin:    General: Skin is warm and dry.     Findings: No rash.  Neurological:      General: No focal deficit present.     Mental Status: She is alert and oriented to person, place, and time.     Cranial Nerves: No cranial nerve deficit (no facial droop, extraocular movements intact, no slurred speech but is slow and deliberate).     Sensory: No sensory deficit.     Motor: Weakness present. No abnormal muscle tone or seizure activity.     Comments: Left sided hemiparesis, arm greater than leg  Psychiatric:  Mood and Affect: Mood normal.    ED Results / Procedures / Treatments   Labs (all labs ordered are listed, but only abnormal results are displayed) Labs Reviewed  CBC - Abnormal; Notable for the following components:      Result Value   RBC 3.43 (*)    Hemoglobin 10.2 (*)    HCT 31.6 (*)    All other components within normal limits  COMPREHENSIVE METABOLIC PANEL - Abnormal; Notable for the following components:   Calcium 7.9 (*)    Albumin 3.3 (*)    GFR, Estimated 60 (*)    All other components within normal limits  URINALYSIS, ROUTINE W REFLEX MICROSCOPIC - Abnormal; Notable for the following components:   Color, Urine STRAW (*)    All other components within normal limits  RESP PANEL BY RT-PCR (FLU A&B, COVID) ARPGX2  ETHANOL  PROTIME-INR  APTT  DIFFERENTIAL  RAPID URINE DRUG SCREEN, HOSP PERFORMED    EKG EKG Interpretation  Date/Time:  Tuesday September 14 2020 07:30:58 EDT Ventricular Rate:  78 PR Interval:  240 QRS Duration: 92 QT Interval:  403 QTC Calculation: 459 R Axis:   73 Text Interpretation: Sinus rhythm Prolonged PR interval Borderline low voltage, extremity leads No significant change since last tracing Confirmed by Dorie Rank [id number]) on 09/14/2020 9:55:47 AM  Radiology CT HEAD WO CONTRAST  Result Date: 09/14/2020 CLINICAL DATA:  Confusion EXAM: CT HEAD WITHOUT CONTRAST TECHNIQUE: Contiguous axial images were obtained from the base of the skull through the vertex without intravenous contrast. COMPARISON:  07/15/2020 FINDINGS:  Brain: There is atrophy and chronic small vessel disease changes. No acute intracranial abnormality. Specifically, no hemorrhage, hydrocephalus, mass lesion, acute infarction, or significant intracranial injury. Vascular: No hyperdense vessel or unexpected calcification. Skull: No acute calvarial abnormality. Sinuses/Orbits: No acute findings Other: None IMPRESSION: Atrophy, chronic microvascular disease. No acute intracranial abnormality. Electronically Signed   By: Rolm Baptise M.D.   On: 09/14/2020 08:25    Procedures Procedures   Medications Ordered in ED Medications - No data to display  ED Course  I have reviewed the triage vital signs and the nursing notes.  Pertinent labs & imaging results that were available during my care of the patient were reviewed by me and considered in my medical decision making (see chart for details).  Clinical Course as of 09/14/20 1153  Tue Sep 14, 2020  0950 Labs reviewed.  COVID test negative. [JK]  [id number] CBC metabolic panel unremarkable [JK]  0951 Head CT without acute findings [JK]  1029 Urinalysis negative [JK]    Clinical Course User Index [JK] Dorie Rank, MD   MDM Rules/Calculators/A&P                           Patient presented to the ED for evaluation of possible change in mental status.  On exam patient has no focal neurodeficits other than left-sided weakness.  This however is a chronic finding.  I did confirm this with the patient's daughter.  No signs of acute infection.  No signs of electrolyte abnormalities.  Head CT does not show any signs of bleed or hemorrhage.  No stroke noted.  Patient appears to be at her baseline.  Patient's daughter did speak with her on the telephone and states she sounds normal to her.  Patient states she is feeling better and would like to go back to the nursing facility.  She does appear to have chronic issues with  left lower extremity vascular disease but does not show any signs of acute vascular occlusion.   Stable for discharge back to the nursing facility Final Clinical Impression(s) / ED Diagnoses Final diagnoses:  Pain of left lower extremity due to ischemia    Rx / DC Orders ED Discharge Orders     None        Dorie Rank, MD 09/14/20 1153

## 2020-09-14 NOTE — ED Triage Notes (Addendum)
Patient is from [facility]. Staff thinks patient is confused, disoriented, and aggressive towards staff. Left leg has cellulitis and wound.  Hx: Falls  CBG: 110 170/80-90-97% RA

## 2020-09-14 NOTE — ED Notes (Signed)
Patient questioning sore on her heel.

## 2020-09-14 NOTE — ED Notes (Signed)
Awaiting transportation back to the facility

## 2020-09-14 NOTE — ED Notes (Signed)
PTAR team here to take patient back to facility. PIV removed, paperwork given to team. Facility aware patient is returning.

## 2020-09-14 NOTE — ED Notes (Signed)
Pt in imaging at this time.

## 2020-09-14 NOTE — ED Provider Notes (Signed)
Amber [name]   CSN: [id number] Arrival date & time: 09/14/20  2140     History Chief Complaint  Patient presents with   Amber [name]    Amber [name] is a 87 y.o. female.  Patient is an 87 year old female presenting for fall.  Per chart review, patient was seen in the emergency department earlier this morning after being sent from her assisted living center for altered mental status.  Had negative laboratory study work-up including negative CT head at that time and was discharged home.  She states after arriving home she fell out of her bed approximately 1 hour prior to arrival.  The blunt head trauma to the head and orbits after hitting her face against a stand.  Denies any loss of consciousness.  Denies any blood thinner use.  Denies any other bony tenderness or pain.  The history is provided by the patient. No language interpreter was used.  Fall Pertinent negatives include no chest pain, no abdominal pain and no shortness of breath.      Past Medical History:  Diagnosis Date   Complication of anesthesia    "woke up jumping and bouncing"   COVID-19    Hyperlipidemia    Hypertension    sarcoma of lt leg dx'd 1989   xrt and chemo and surg   Thyroid disease     Patient Active Problem List   Diagnosis Date Noted   Lower limb ischemia 08/31/2020   Syncope and collapse 07/15/2020   hx of COVID-19 07/15/2020   AKI (acute kidney injury) ([place]) 07/15/2020   Proximal Femur Fracture, left /Mechanical Fall 10/19/2017   Femur fracture ([employer]) 10/19/2017   Dizziness 01/19/2016   Fall 01/19/2016   Closed right hip fracture ([place]) 01/19/2016   Hypertension 01/19/2016   Hypothyroidism 01/19/2016   Hyperlipidemia 01/19/2016   Hip fracture ([place]) 01/19/2016   Status post laparoscopic cholecystectomy 03/27/2013    Past Surgical History:  Procedure Laterality Date   ABDOMINAL HYSTERECTOMY     CHOLECYSTECTOMY N/A 03/27/2013   Procedure:  LAPAROSCOPIC CHOLECYSTECTOMY WITH INTRAOPERATIVE CHOLANGIOGRAM;  Surgeon: Pedro Earls, MD;  Location: [facility];  Service: General;  Laterality: N/A;   HIP ARTHROPLASTY Right 01/20/2016   Procedure: ARTHROPLASTY BIPOLAR HIP (HEMIARTHROPLASTY);  Surgeon: Renette Butters, MD;  Location: [facility];  Service: Orthopedics;  Laterality: Right;   HIP ARTHROPLASTY Left 10/20/2017   Procedure: ARTHROPLASTY BIPOLAR HIP (HEMIARTHROPLASTY);  Surgeon: Hiram Gash, MD;  Location: [facility];  Service: Orthopedics;  Laterality: Left;   KNEE SURGERY Left    approx 4 surgeries on left knee     OB History   No obstetric history on file.     Family History  Problem Relation Age of Onset   Lung cancer Sister    Bone cancer Brother    Stomach cancer Sister     Social History   Tobacco Use   Smoking status: Former    Types: Cigarettes    Quit date: 03/27/1983    Years since quitting: 37.4   Smokeless tobacco: Never  Vaping Use   Vaping Use: Never used  Substance Use Topics   Alcohol use: No   Drug use: No    Home Medications Prior to Admission medications   Medication Sig Start Date End Date Taking? Authorizing Provider  aspirin EC 81 MG tablet Take 81 mg by mouth daily.    [provider]  benazepril (LOTENSIN) 20 MG tablet Take 10 mg by mouth daily.  [provider]  ferrous sulfate 325 (65 FE) MG tablet Take 1 tablet (325 mg total) by mouth every other day. 09/03/20 10/03/20  Delene Ruffini, MD  fluticasone (FLONASE) 50 MCG/ACT nasal spray Place 1 spray into both nostrils daily as needed for allergies or rhinitis.    [provider]  levothyroxine (SYNTHROID) 50 MCG tablet Take 50 mcg by mouth daily before breakfast.    [provider]  lovastatin (MEVACOR) 20 MG tablet Take 20 mg by mouth at bedtime. 11/17/15   [provider]  Multiple Vitamin (MULTIVITAMIN WITH MINERALS) TABS tablet Take 1 tablet by mouth daily.    [provider]  Omega-3  Fatty Acids (FISH OIL) 1000 MG CAPS Take 1,000 mg by mouth daily.    [provider]  polyethylene glycol (MIRALAX / GLYCOLAX) packet Take 17 g by mouth daily. Patient taking differently: Take 17 g by mouth every 12 (twelve) hours as needed (for constipation). 10/24/17   Florencia Reasons, MD  senna-docusate (SENOKOT-S) 8.6-50 MG tablet Take 1 tablet by mouth 2 (two) times daily. 10/23/17   Florencia Reasons, MD  vitamin B-12 (CYANOCOBALAMIN) 1000 MCG tablet Take 1,000 mcg by mouth daily.    [provider]  enoxaparin (LOVENOX) 40 MG/0.4ML injection Inject 0.4 mLs (40 mg total) into the skin daily. Patient not taking: Reported on 03/04/2019 10/21/17 12/11/19  Hiram Gash, MD    Allergies    Patient has no known allergies.  Review of Systems   Review of Systems  Constitutional:  Negative for chills and fever.  HENT:  Negative for ear pain and sore throat.   Eyes:  Negative for pain and visual disturbance.  Respiratory:  Negative for cough and shortness of breath.   Cardiovascular:  Negative for chest pain and palpitations.  Gastrointestinal:  Negative for abdominal pain and vomiting.  Genitourinary:  Negative for dysuria and hematuria.  Musculoskeletal:  Negative for arthralgias and back pain.  Skin:  Positive for wound. Negative for color change and rash.  Neurological:  Negative for seizures and syncope.  All other systems reviewed and are negative.  Physical Exam Updated Vital Signs BP (!) 125/59   Pulse 72   Temp 98 F (36.7 C)   Resp 18   Ht '5\' 8"'$  (1.727 m)   Wt 67 kg   SpO2 100%   BMI 22.46 kg/m   Physical Exam Vitals and nursing [name] reviewed.  Constitutional:      General: She is not in acute distress.    Appearance: She is well-developed.  HENT:     Head:   Eyes:     Conjunctiva/sclera: Conjunctivae normal.  Cardiovascular:     Rate and Rhythm: Normal rate and regular rhythm.     Heart sounds: No murmur heard. Pulmonary:     Effort: Pulmonary effort is  normal. No respiratory distress.     Breath sounds: Normal breath sounds.  Abdominal:     Palpations: Abdomen is soft.     Tenderness: There is no abdominal tenderness.  Musculoskeletal:     Cervical back: Neck supple. No bony tenderness.     Thoracic back: No bony tenderness.     Lumbar back: No bony tenderness.  Skin:    General: Skin is warm and dry.       Neurological:     Mental Status: She is alert.     GCS: GCS eye subscore is 4. GCS verbal subscore is 5. GCS motor subscore is 6.  Sensory: Sensation is intact.     Motor: Motor function is intact.    ED Results / Procedures / Treatments   Labs (all labs ordered are listed, but only abnormal results are displayed) Labs Reviewed  CBC    EKG None  Radiology CT HEAD WO CONTRAST  Result Date: 09/14/2020 CLINICAL DATA:  Confusion EXAM: CT HEAD WITHOUT CONTRAST TECHNIQUE: Contiguous axial images were obtained from the base of the skull through the vertex without intravenous contrast. COMPARISON:  07/15/2020 FINDINGS: Brain: There is atrophy and chronic small vessel disease changes. No acute intracranial abnormality. Specifically, no hemorrhage, hydrocephalus, mass lesion, acute infarction, or significant intracranial injury. Vascular: No hyperdense vessel or unexpected calcification. Skull: No acute calvarial abnormality. Sinuses/Orbits: No acute findings Other: None IMPRESSION: Atrophy, chronic microvascular disease. No acute intracranial abnormality. Electronically Signed   By: Rolm Baptise M.D.   On: 09/14/2020 08:25    Procedures Procedures   Medications Ordered in ED Medications - No data to display  ED Course  I have reviewed the triage vital signs and the nursing notes.  Pertinent labs & imaging results that were available during my care of the patient were reviewed by me and considered in my medical decision making (see chart for details).    MDM Rules/Calculators/A&P                           87 year old  female presenting for fall.  Patient is alert oriented x3, no acute distress, afebrile, stable vital signs.  Physical exam demonstrates right sided periorbital swelling and ecchymosis as well as contusion to left forehead.  No neurovascular deficits.  No history of blood thinner use.  No spinal tenderness.  CT head demonstrates: No acute intracranial injury. No calvarial fracture. Mild right periorbital and left scalp soft tissue swelling.  CT neck demonstrates:  1. No acute fracture or subluxation of the cervical spine. 2. Degenerative disc disease and facet hypertrophy. 3. Left apical pulmonary nodule is increasing in size and density, currently 17 mm, previously sub solid. Findings are suspicious for malignancy. Chest CT again recommended for further assessment, this could be performed on a nonemergent basis.  CT orbits: 1. Small fracture off the inferior aspect right side nasal bone, with mild adjacent soft tissue swelling.  Chart review demonstrates patient was seen early today after being sent in from assisted living center for concerns for AMS and aggression towards staff. Patient had stable ECG, negative CT head, stable cbc and bmp, negative UA, and negative UDS at this time.   Recommended for admission at this time due to visit in 24 hours, initial evaluation for altered mental status with new presentation to ED for fall with facial trauma and nasal bone fracture.  I spoke with admitting physician Dr. Myna Hidalgo who agrees to accept patient.  Spoke with patient's daughter, Amber [name], who is up-to-date on patient's current status, admission, and incidental lung findings with recommendations for follow-up upon discharge to rule out malignancy.     Final Clinical Impression(s) / ED Diagnoses Final diagnoses:  Fall, initial encounter  Facial injury, initial encounter  Closed fracture of nasal bone, initial encounter  Abnormal CT scan, lung-pulmonary nodule, increasing in size  Hematoma of  scalp, initial encounter  Periorbital ecchymosis of right eye, initial encounter  Anemia, unspecified type    Rx / DC Orders ED Discharge Orders     None        Lianne Cure, DO [id number]

## 2020-09-14 NOTE — ED Notes (Signed)
Pt attached to cardiac monitor x3. VSS. A&O x4 at this time.

## 2020-09-14 NOTE — ED Triage Notes (Signed)
BIB EMS from [facility] after unwitnessed fall. Patient is A&O x4. Denies loc. Abrasion noted to left knee and right eye. No BT usage noted. Pt is DNR.

## 2020-09-14 NOTE — ED Notes (Signed)
Donzetta Kohut, daughter would like a call with an update, number is in chart.

## 2020-09-14 NOTE — ED Notes (Signed)
Pt back from imaging at this time.

## 2020-09-14 NOTE — Discharge Instructions (Addendum)
The test today in ED were reassuring.  Continue your current medications.  Follow-up with the doctors at the nursing facility

## 2020-09-15 ENCOUNTER — Encounter (HOSPITAL_COMMUNITY): Payer: Self-pay | Admitting: Family Medicine

## 2020-09-15 DIAGNOSIS — D649 Anemia, unspecified: Secondary | ICD-10-CM | POA: Diagnosis present

## 2020-09-15 DIAGNOSIS — G9340 Encephalopathy, unspecified: Secondary | ICD-10-CM

## 2020-09-15 DIAGNOSIS — R911 Solitary pulmonary nodule: Secondary | ICD-10-CM

## 2020-09-15 DIAGNOSIS — E039 Hypothyroidism, unspecified: Secondary | ICD-10-CM

## 2020-09-15 DIAGNOSIS — S022XXA Fracture of nasal bones, initial encounter for closed fracture: Secondary | ICD-10-CM | POA: Diagnosis not present

## 2020-09-15 DIAGNOSIS — I739 Peripheral vascular disease, unspecified: Secondary | ICD-10-CM

## 2020-09-15 DIAGNOSIS — N182 Chronic kidney disease, stage 2 (mild): Secondary | ICD-10-CM | POA: Diagnosis present

## 2020-09-15 LAB — BASIC METABOLIC PANEL
Anion gap: 6 (ref 5–15)
BUN: 17 mg/dL (ref 8–23)
CO2: 27 mmol/L (ref 22–32)
Calcium: 8.9 mg/dL (ref 8.9–10.3)
Chloride: 107 mmol/L (ref 98–111)
Creatinine, Ser: 0.92 mg/dL (ref 0.44–1.00)
GFR, Estimated: >60 mL/min (ref >60)
Glucose, Bld: 93 mg/dL (ref 70–99)
Potassium: 4.3 mmol/L (ref 3.5–5.1)
Sodium: 140 mmol/L (ref 135–145)

## 2020-09-15 LAB — TSH: TSH: 7.661 u[IU]/mL — ABNORMAL HIGH (ref 0.350–4.500)

## 2020-09-15 LAB — RPR: RPR Ser Ql: NONREACTIVE

## 2020-09-15 LAB — CBC
HCT: 30.7 % — ABNORMAL LOW (ref 36.0–46.0)
Hemoglobin: 9.8 g/dL — ABNORMAL LOW (ref 12.0–15.0)
MCH: 30.1 pg (ref 26.0–34.0)
MCHC: 31.9 g/dL (ref 30.0–36.0)
MCV: 94.2 fL (ref 80.0–100.0)
Platelets: 223 10*3/uL (ref 150–400)
RBC: 3.26 MIL/uL — ABNORMAL LOW (ref 3.87–5.11)
RDW: 14.7 % (ref 11.5–15.5)
WBC: 4.6 10*3/uL (ref 4.0–10.5)
nRBC: 0 % (ref 0.0–0.2)

## 2020-09-15 LAB — AMMONIA: Ammonia: 14 umol/L (ref 9–35)

## 2020-09-15 MED ORDER — POLYETHYLENE GLYCOL 3350 17 G PO PACK: 17.0000 g | PACK | Freq: Every day | ORAL | Status: DC | PRN

## 2020-09-15 MED ORDER — ENOXAPARIN SODIUM 40 MG/0.4ML IJ SOSY
40.0000 mg | PREFILLED_SYRINGE | INTRAMUSCULAR | Status: DC
  Administered 2020-09-15 – 2020-09-16 (×2): 40 mg via SUBCUTANEOUS
  Filled 2020-09-15 (×2): qty 0.4

## 2020-09-15 MED ORDER — LEVOTHYROXINE SODIUM 50 MCG PO TABS: 50.0000 ug | ORAL_TABLET | Freq: Every day | ORAL | Status: DC

## 2020-09-15 MED ORDER — ACETAMINOPHEN 325 MG PO TABS
650.0000 mg | ORAL_TABLET | Freq: Four times a day (QID) | ORAL | Status: DC | PRN
  Administered 2020-09-16 (×2): 650 mg via ORAL
  Filled 2020-09-15 (×2): qty 2

## 2020-09-15 MED ORDER — ACETAMINOPHEN 650 MG RE SUPP: 650.0000 mg | Freq: Four times a day (QID) | RECTAL | Status: DC | PRN

## 2020-09-15 MED ORDER — LEVOTHYROXINE SODIUM 50 MCG PO TABS
50.0000 ug | ORAL_TABLET | Freq: Every day | ORAL | Status: DC
  Administered 2020-09-16: 50 ug via ORAL
  Filled 2020-09-15: qty 1

## 2020-09-15 MED ORDER — ONDANSETRON HCL 4 MG/2ML IJ SOLN: 4.0000 mg | Freq: Four times a day (QID) | INTRAMUSCULAR | Status: DC | PRN

## 2020-09-15 MED ORDER — ASPIRIN EC 81 MG PO TBEC
81.0000 mg | DELAYED_RELEASE_TABLET | Freq: Every day | ORAL | Status: DC
  Administered 2020-09-16: 81 mg via ORAL
  Filled 2020-09-15: qty 1

## 2020-09-15 MED ORDER — ONDANSETRON HCL 4 MG PO TABS: 4.0000 mg | ORAL_TABLET | Freq: Four times a day (QID) | ORAL | Status: DC | PRN

## 2020-09-15 MED ORDER — SODIUM CHLORIDE 0.9 % IV SOLN
1.0000 g | INTRAVENOUS | Status: DC
  Administered 2020-09-15 – 2020-09-16 (×2): 1 g via INTRAVENOUS
  Filled 2020-09-15: qty 1
  Filled 2020-09-15: qty 10

## 2020-09-15 MED ORDER — PRAVASTATIN SODIUM 20 MG PO TABS
20.0000 mg | ORAL_TABLET | Freq: Every day | ORAL | Status: DC
  Administered 2020-09-16: 20 mg via ORAL
  Filled 2020-09-15: qty 1

## 2020-09-15 NOTE — ED Notes (Signed)
Pt pulled up in bed and repositioned. Brief check and was dry. Pure wick in place. Pt refuses to change into hospital gown at this time and refuses to let us take off her pants.

## 2020-09-15 NOTE — Progress Notes (Addendum)
Subjective: Patient admitted this morning, see detailed H&P by Dr Myna Hidalgo 87 year old female with history of hypertension, hypothyroidism, remote left leg sarcoma, peripheral arterial disease presented to ED with confusion and unwitnessed fall. CT head was negative for acute abnormality.  Orbital CT showed minimally displaced nasal bone fracture. . Vitals:   09/15/20 1215 09/15/20 1230  BP: 138/67 138/67  Pulse: 65 65  Resp:  16  Temp:    SpO2: 100% 100%      A/P  Acute encephalopathy -Improved; patient is , oriented x3 -CT head is unremarkable -TSH was 3.788 in June -B12 554; ammonia 14  ?  Left lower extremity cellulitis -Patient has erythema and tenderness to palpation in the left lower extremity -We will start IV ceftriaxone every 24 hours  Fall with nasal bone fracture -Orbital CT scan shows minimally displaced nasal bone fracture  Anemia -Hemoglobin is stable at 9.8  Peripheral arterial disease -Continue aspirin, statin -She was seen by vascular surgery during this admission and family did not think that she would want arteriogram or intervention  Hypothyroidism -Continue Synthroid  CKD stage II -Creatinine stable at 0.92  Pulmonary nodule -Noted on CT C-spine with increased size and density, concerning for malignancy -Patient's daughter is here of nodule and recommendation for nonemergent CT chest as per admitting provider     [facility] Pager- [phone]

## 2020-09-15 NOTE — H&P (Signed)
History and Physical    Amber [name] [id number] DOB: Jun 24, 1932 DOA: 09/14/2020  PCP: Pcp, No   Patient coming from: ALF   Chief Complaint: Fall, confusion   HPI: Amber [name] is a 86 y.o. female with medical history significant for hypertension, hypothyroidism, remote left leg sarcoma, peripheral arterial disease, now presenting to the emergency department with waxing and waning confusion and an unwitnessed fall.  Patient was seen in the emergency department earlier in the day with confusion and combativeness, appeared to return to her usual state, and was discharged back to the ALF where she had an unwitnessed fall out of her bed shortly after returning.  Patient has waxing and waning confusion back in the ED, denies losing consciousness, but does not remember exactly how she fell, but notes that she has had a lot of falls recently.  Patient's family has been suspecting that the patient has dementia though no formal diagnosis has been made.  Patient's family has been working to have the patient moved to a nursing facility closer to them in [place].  Patient's daughter spoke with patient earlier in the day and she seemed to be in her usual state and did not have any acute complaints.  ED Course: Upon arrival to the ED, patient is found to be afebrile, saturating well on room air, and with blood pressure 108/51.  EKG features sinus rhythm with first-degree AV nodal block.  Head CT negative for acute intracranial abnormality but notable for periorbital and scalp soft tissue swelling without underlying calvarial fracture.  No acute cervical spine fracture or subluxation was noted on CT but the study demonstrated increased size and density of left apical pulmonary nodule.  CT also was notable for small minimally displaced nasal bone fracture.  Review of Systems:  All other systems reviewed and apart from HPI, are negative.  Past Medical History:  Diagnosis Date   Complication of anesthesia     "woke up jumping and bouncing"   COVID-19    Hyperlipidemia    Hypertension    sarcoma of lt leg dx'd 1989   xrt and chemo and surg   Thyroid disease     Past Surgical History:  Procedure Laterality Date   ABDOMINAL HYSTERECTOMY     CHOLECYSTECTOMY N/A 03/27/2013   Procedure: LAPAROSCOPIC CHOLECYSTECTOMY WITH INTRAOPERATIVE CHOLANGIOGRAM;  Surgeon: Pedro Earls, MD;  Location: [facility];  Service: General;  Laterality: N/A;   HIP ARTHROPLASTY Right 01/20/2016   Procedure: ARTHROPLASTY BIPOLAR HIP (HEMIARTHROPLASTY);  Surgeon: Renette Butters, MD;  Location: [facility];  Service: Orthopedics;  Laterality: Right;   HIP ARTHROPLASTY Left 10/20/2017   Procedure: ARTHROPLASTY BIPOLAR HIP (HEMIARTHROPLASTY);  Surgeon: Hiram Gash, MD;  Location: [facility];  Service: Orthopedics;  Laterality: Left;   KNEE SURGERY Left    approx 4 surgeries on left knee    Social History:   reports that she quit smoking about 37 years ago. She has never used smokeless tobacco. She reports that she does not drink alcohol and does not use drugs.  No Known Allergies  Family History  Problem Relation Age of Onset   Lung cancer Sister    Bone cancer Brother    Stomach cancer Sister      Prior to Admission medications   Medication Sig Start Date End Date Taking? Authorizing Provider  aspirin EC 81 MG tablet Take 81 mg by mouth daily.    [provider]  benazepril (LOTENSIN) 20 MG tablet Take 10 mg by  mouth daily.    [provider]  ferrous sulfate 325 (65 FE) MG tablet Take 1 tablet (325 mg total) by mouth every other day. 09/03/20 10/03/20  Delene Ruffini, MD  fluticasone (FLONASE) 50 MCG/ACT nasal spray Place 1 spray into both nostrils daily as needed for allergies or rhinitis.    [provider]  levothyroxine (SYNTHROID) 50 MCG tablet Take 50 mcg by mouth daily before breakfast.    [provider]  lovastatin (MEVACOR) 20 MG tablet Take 20 mg by mouth at bedtime.  11/17/15   [provider]  Multiple Vitamin (MULTIVITAMIN WITH MINERALS) TABS tablet Take 1 tablet by mouth daily.    [provider]  Omega-3 Fatty Acids (FISH OIL) 1000 MG CAPS Take 1,000 mg by mouth daily.    [provider]  polyethylene glycol (MIRALAX / GLYCOLAX) packet Take 17 g by mouth daily. Patient taking differently: Take 17 g by mouth every 12 (twelve) hours as needed (for constipation). 10/24/17   Florencia Reasons, MD  senna-docusate (SENOKOT-S) 8.6-50 MG tablet Take 1 tablet by mouth 2 (two) times daily. 10/23/17   Florencia Reasons, MD  vitamin B-12 (CYANOCOBALAMIN) 1000 MCG tablet Take 1,000 mcg by mouth daily.    [provider]  enoxaparin (LOVENOX) 40 MG/0.4ML injection Inject 0.4 mLs (40 mg total) into the skin daily. Patient not taking: Reported on 03/04/2019 10/21/17 12/11/19  Hiram Gash, MD    Physical Exam: Vitals:   09/14/20 2315 09/14/20 2330 09/15/20 0000 09/15/20 0015  BP: (!) 139/56 135/62 134/72 (!) 108/51  Pulse: 74 76 75 67  Resp:    20  Temp:      SpO2: 100% 100% 100% 98%  Weight:      Height:        Constitutional: NAD, calm  Eyes: PERTLA, lids and conjunctivae normal ENMT: Mucous membranes are moist. Posterior pharynx clear of any exudate or lesions.   Neck: supple, no masses  Respiratory: no wheezing, no crackles. No accessory muscle use.  Cardiovascular: S1 & S2 heard, regular rate and rhythm. Pretibial pitting edema bilaterally.   Abdomen: No distension, no tenderness, soft. Bowel sounds active.  Musculoskeletal: no clubbing / cyanosis. Right periorbital and left frontal scalp swelling.   Skin: Left lower leg erythema. Warm, dry, well-perfused. Neurologic: CN 2-12 grossly intact. Sensation intact. Moving all extremities.  Psychiatric: Alert, oriented to person, place, and situation. Calm and cooperative.    Labs and Imaging on Admission: I have personally reviewed following labs and imaging studies  CBC: Recent Labs  Lab  09/14/20 0749 09/14/20 2311  WBC 4.8 5.3  NEUTROABS 3.1  --   HGB 10.2* 9.5*  HCT 31.6* 29.6*  MCV 92.1 93.4  PLT 239 [id number]   Basic Metabolic Panel: Recent Labs  Lab 09/14/20 0749  NA 142  K 4.6  CL 108  CO2 25  GLUCOSE 91  BUN 18  CREATININE 0.93  CALCIUM 7.9*   GFR: Estimated Creatinine Clearance: 43.8 mL/min (by C-G formula based on SCr of 0.93 mg/dL). Liver Function Tests: Recent Labs  Lab 09/14/20 0749  AST 19  ALT 17  ALKPHOS 67  BILITOT 0.7  PROT 7.6  ALBUMIN 3.3*   No results for input(s): LIPASE, AMYLASE in the last 168 hours. No results for input(s): AMMONIA in the last 168 hours. Coagulation Profile: Recent Labs  Lab 09/14/20 0749  INR 1.0   Cardiac Enzymes: No results for input(s): CKTOTAL, CKMB, CKMBINDEX, TROPONINI in the last 168 hours.  BNP (last 3 results) No results for input(s): PROBNP in the last 8760 hours. HbA1C: No results for input(s): HGBA1C in the last 72 hours. CBG: No results for input(s): GLUCAP in the last 168 hours. Lipid Profile: No results for input(s): CHOL, HDL, LDLCALC, TRIG, CHOLHDL, LDLDIRECT in the last 72 hours. Thyroid Function Tests: No results for input(s): TSH, T4TOTAL, FREET4, T3FREE, THYROIDAB in the last 72 hours. Anemia Panel: No results for input(s): VITAMINB12, FOLATE, FERRITIN, TIBC, IRON, RETICCTPCT in the last 72 hours. Urine analysis:    Component Value Date/Time   COLORURINE STRAW (A) 09/14/2020 0936   APPEARANCEUR CLEAR 09/14/2020 0936   LABSPEC 1.006 09/14/2020 0936   PHURINE 5.0 09/14/2020 0936   GLUCOSEU NEGATIVE 09/14/2020 0936   HGBUR NEGATIVE 09/14/2020 0936   BILIRUBINUR NEGATIVE 09/14/2020 0936   KETONESUR NEGATIVE 09/14/2020 0936   PROTEINUR NEGATIVE 09/14/2020 0936   UROBILINOGEN 1.0 03/26/2013 1537   NITRITE NEGATIVE 09/14/2020 0936   LEUKOCYTESUR NEGATIVE 09/14/2020 0936   Sepsis Labs: '@LABRCNTIP'$ (procalcitonin:4,lacticidven:4) ) Recent Results (from the past 240 hour(s))   Resp Panel by RT-PCR (Flu A&B, Covid) Nasopharyngeal Swab     Status: None   Collection Time: 09/14/20  7:51 AM   Specimen: Nasopharyngeal Swab; Nasopharyngeal(NP) swabs in vial transport medium  Result Value Ref Range Status   SARS Coronavirus 2 by RT PCR NEGATIVE NEGATIVE Final    Comment: (NOTE) SARS-CoV-2 target nucleic acids are NOT DETECTED.  The SARS-CoV-2 RNA is generally detectable in upper respiratory specimens during the acute phase of infection. The lowest concentration of SARS-CoV-2 viral copies this assay can detect is 138 copies/mL. A negative result does not preclude SARS-Cov-2 infection and should not be used as the sole basis for treatment or other patient management decisions. A negative result may occur with  improper specimen collection/handling, submission of specimen other than nasopharyngeal swab, presence of viral mutation(s) within the areas targeted by this assay, and inadequate number of viral copies(<138 copies/mL). A negative result must be combined with clinical observations, patient history, and epidemiological information. The expected result is Negative.  Fact Sheet for Patients:  EntrepreneurPulse.com.au  Fact Sheet for Healthcare Providers:  IncredibleEmployment.be  This test is no t yet approved or cleared by the [place] FDA and  has been authorized for detection and/or diagnosis of SARS-CoV-2 by FDA under an Emergency Use Authorization (EUA). This EUA will remain  in effect (meaning this test can be used) for the duration of the COVID-19 declaration under Section 564(b)(1) of the Act, 21 U.S.C.section 360bbb-3(b)(1), unless the authorization is terminated  or revoked sooner.       Influenza A by PCR NEGATIVE NEGATIVE Final   Influenza B by PCR NEGATIVE NEGATIVE Final    Comment: (NOTE) The Xpert Xpress SARS-CoV-2/FLU/RSV plus assay is intended as an aid in the diagnosis of influenza from  Nasopharyngeal swab specimens and should not be used as a sole basis for treatment. Nasal washings and aspirates are unacceptable for Xpert Xpress SARS-CoV-2/FLU/RSV testing.  Fact Sheet for Patients: EntrepreneurPulse.com.au  Fact Sheet for Healthcare Providers: IncredibleEmployment.be  This test is not yet approved or cleared by the [place] FDA and has been authorized for detection and/or diagnosis of SARS-CoV-2 by FDA under an Emergency Use Authorization (EUA). This EUA will remain in effect (meaning this test can be used) for the duration of the COVID-19 declaration under Section 564(b)(1) of the Act, 21 U.S.C. section 360bbb-3(b)(1), unless the authorization is terminated or revoked.  Performed at [facility],  [place] [address], [place], [place] [address]      Radiological Exams on Admission: CT HEAD WO CONTRAST (5MM)  Result Date: 09/14/2020 CLINICAL DATA:  Head trauma, minor (Age >= 65y). Unwitnessed fall. Periorbital and scalp abrasions. EXAM: CT HEAD WITHOUT CONTRAST TECHNIQUE: Contiguous axial images were obtained from the base of the skull through the vertex without intravenous contrast. COMPARISON:  07/15/2020 FINDINGS: Brain: Normal anatomic configuration. Parenchymal volume loss is commensurate with the patient's age. Moderate periventricular white matter changes are present likely reflecting the sequela of small vessel ischemia, stable since prior examination. No abnormal intra or extra-axial mass lesion or fluid collection. No abnormal mass effect or midline shift. No evidence of acute intracranial hemorrhage or infarct. Ventricular size is normal. Cerebellum unremarkable. Vascular: No asymmetric hyperdense vasculature at the skull base. Skull: Intact Sinuses/Orbits: Paranasal sinuses are clear. Orbits are unremarkable. Other: Mastoid air cells and middle ear cavities are clear. Mild left supraorbital scalp and mild  right preseptal soft tissue swelling is present. IMPRESSION: No acute intracranial injury. No calvarial fracture. Mild right periorbital and left scalp soft tissue swelling. Electronically Signed   By: Fidela Salisbury MD   On: 09/14/2020 23:38   CT HEAD WO CONTRAST  Result Date: 09/14/2020 CLINICAL DATA:  Confusion EXAM: CT HEAD WITHOUT CONTRAST TECHNIQUE: Contiguous axial images were obtained from the base of the skull through the vertex without intravenous contrast. COMPARISON:  07/15/2020 FINDINGS: Brain: There is atrophy and chronic small vessel disease changes. No acute intracranial abnormality. Specifically, no hemorrhage, hydrocephalus, mass lesion, acute infarction, or significant intracranial injury. Vascular: No hyperdense vessel or unexpected calcification. Skull: No acute calvarial abnormality. Sinuses/Orbits: No acute findings Other: None IMPRESSION: Atrophy, chronic microvascular disease. No acute intracranial abnormality. Electronically Signed   By: Rolm Baptise M.D.   On: 09/14/2020 08:25   CT Cervical Spine Wo Contrast  Result Date: 09/15/2020 CLINICAL DATA:  Unwitnessed fall.  Neck trauma. EXAM: CT CERVICAL SPINE WITHOUT CONTRAST TECHNIQUE: Multidetector CT imaging of the cervical spine was performed without intravenous contrast. Multiplanar CT image reconstructions were also generated. COMPARISON:  None. FINDINGS: Alignment: Straightening of normal lordosis. No traumatic subluxation. Skull base and vertebrae: No acute fracture. Vertebral body heights are maintained. The dens and skull base are intact. Bones appear under mineralized Soft tissues and spinal canal: No prevertebral fluid or swelling. No visible canal hematoma. Disc levels: Degenerative disc disease most prominently affecting C5-C6. There is scattered facet hypertrophy. Upper chest: Left apical pulmonary nodule is increasing in size in density, currently 17 mm, previously 16 mm but less dense. This is only partially included in  the field of view. Apical emphysema. Other: Carotid calcifications. IMPRESSION: 1. No acute fracture or subluxation of the cervical spine. 2. Degenerative disc disease and facet hypertrophy. 3. Left apical pulmonary nodule is increasing in size and density, currently 17 mm, previously sub solid. Findings are suspicious for malignancy. Chest CT again recommended for further assessment, this could be performed on a nonemergent basis. Electronically Signed   By: Keith Rake M.D.   On: 09/15/2020 00:04   CT Orbits Wo Contrast  Result Date: 09/14/2020 CLINICAL DATA:  Facial trauma, abrasion right periorbital region and left temporal area, un witnessed fall EXAM: CT ORBITS WITHOUT CONTRAST TECHNIQUE: Multidetector CT imaging of the orbits was performed using the standard protocol without intravenous contrast. Multiplanar CT image reconstructions were also generated. COMPARISON:  07/15/2020 FINDINGS: Orbits: No orbital mass or evidence of inflammation. Normal appearance of the globes, optic nerve-sheath complexes,  extraocular muscles, orbital fat and lacrimal glands. Visible paranasal sinuses: Mild polypoid mucosal thickening within the inferior aspect of the maxillary sinuses. Remaining sinuses are clear. Soft tissues: Mild soft tissue swelling in the right periorbital region. Remaining soft tissues are unremarkable. Osseous: There is a minimally displaced fracture along the inferior aspect right side nasal bone. No other acute displaced fractures. Limited intracranial: No acute or significant finding. IMPRESSION: 1. Small fracture off the inferior aspect right side nasal bone, with mild adjacent soft tissue swelling. Electronically Signed   By: Randa Ngo M.D.   On: 09/14/2020 23:36    EKG: Independently reviewed. Sinus rhythm, 1st degree AV block.   Assessment/Plan   1. Acute encephalopathy  - Presents from SNF with waxing and waning confusion, had been combative earlier in the day, and had  unwitnessed fall from bed  - Pt's family has been suspecting dementia and trying to get her into SNF near them in [place]  - No acute intracranial abnormality on head CT; B12 was normal last month and TSH normal in June  - Plan to check ammonia and RPR, institute delirium precautions    2. Fall with nasal bone fracture, contusions  - Presents from ALF with fall out of bed and found to have minimally displaced nasal bone fx  - Supportive care, fall precautions    3. Anemia  - Hgb is 9.5 in ED, down from 10.2 earlier in the day and 11.2 ten days earlier  - No overt bleeding, anemia panel normal last month, repeat in am to ensure stability    4. PAD  - No acute ischemia  - She was seen by vascular during recent admission but family did not think she would want arteriogram or intervention  - Continue ASA and statin    5. Hypothyroidism  - Continue Synthroid    6. CKD II  - SCr is 0.93 on admission, similar to priors  - Renally-dose medications, monitor   7. Pulmonary nodule  - Noted on CT c-spine with increased size and density, concerning for malignancy  - Patient's daughter aware of the nodule and recommendation for non-emergent chest CT    DVT prophylaxis: Lovenox  Code Status: DNR, confirmed on admission  Level of Care: Level of care: Telemetry Family Communication: Daughter updated by phone  Disposition Plan:  Patient is from: ALF  Anticipated d/c is to: TBD Anticipated d/c date is: 8/4 or 09/17/20 Patient currently: Pending additional labs, PT assessment  Consults called: None  Admission status: Observation     Vianne Bulls, MD [facility]  09/15/2020, 3:04 AM

## 2020-09-15 NOTE — Evaluation (Signed)
Physical Therapy Evaluation Patient Details Name: Amber [name] MRN: [id number] DOB: 1932/06/15 Today's Date: 09/15/2020   History of Present Illness  Amber [name] is a 87 y.o. female with medical history significant for hypertension, hypothyroidism, remote left leg sarcoma, peripheral arterial disease,  presenting to the emergency department 09/14/2020 after having been in ED and DC'd back to ALFwith confusion and an unwitnessed fall.  Clinical Impression   Patient  returned to Ed after a fall from bed at her ALF. At baseline, patient nonambulatory, requires assistance for  transfers to [facility]. Patient has dysfunctional  LLE and LUE. Patient demonstrates poor balance when sitting. Unable to attempt trransfers due  to being on high gurney.  LLE quite red,especially when dependent.  Patient appears at baseline and anticipate return to ALF. Patient does not require skilled PT after DC. Pt admitted with above diagnosis.  Pt currently with functional limitations due to the deficits listed below (see PT Problem List). Pt will benefit from skilled PT to increase their independence and safety with mobility to allow discharge to the venue listed below.      Follow Up Recommendations  (return to ALF)    Equipment Recommendations  None recommended by PT    Recommendations for Other Services       Precautions / Restrictions Precautions Precautions: Fall Precaution Comments: LLE pain and dysfucntion from previous cancer LUE dysfunctional, pt states from fracture.      Mobility  Bed Mobility Overal bed mobility: Needs Assistance Bed Mobility: Sidelying to Sit;Sit to Sidelying     Supine to sit: Max assist;HOB elevated   Sit to sidelying: Max assist;HOB elevated General bed mobility comments: max assist to sit upright,. assist with legs back onto gurney.    Transfers                 General transfer comment: deferred, on high gurney, no WC, poor sitting balance  Ambulation/Gait                 Stairs            Wheelchair Mobility    Modified Rankin (Stroke Patients Only)       Balance   Sitting-balance support: Feet unsupported;Single extremity supported Sitting balance-Leahy Scale: Zero Sitting balance - Comments: listing/falling to the left when not max supported.                                     Pertinent Vitals/Pain Faces Pain Scale: Hurts even more Pain Location: left leg Pain Descriptors / Indicators: Grimacing;Guarding Pain Intervention(s): Monitored during session    Home Living Family/patient expects to be discharged to:: Assisted living                 Additional Comments: resides at [place] with spouse per patient    Prior Function           Comments: pt reports she needs assistance for all ADL's and transfers to a w/c where she stays during the day, states that she sleeps  in the [facility].Non ambulatory.     Hand Dominance   Dominant Hand: Right    Extremity/Trunk Assessment   Upper Extremity Assessment LUE Deficits / Details: Lacks elbow extension, shoulder contracted at the side, fingers have abnormal movements    Lower Extremity Assessment Lower Extremity Assessment: LLE deficits/detail LLE Deficits / Details: knee contracture at ~ 45 *, lower leg  from knee distally is very reddened, more so when dependent., Able to flex and extend the  leg at hip.       Communication   Communication: HOH  Cognition Arousal/Alertness: Awake/alert Behavior During Therapy: Anxious Overall Cognitive Status: Impaired/Different from baseline Area of Impairment: Orientation                 Orientation Level: Time             General Comments: patient followed simple 1 step. oriented to [facility]. Provided detailed history of LLE cnacer.      General Comments      Exercises     Assessment/Plan    PT Assessment Patient needs continued PT services  PT Problem List Decreased  strength;Decreased mobility;Decreased cognition;Pain;Decreased activity tolerance;Decreased balance       PT Treatment Interventions Functional mobility training;Therapeutic activities;Patient/family education    PT Goals (Current goals can be found in the Care Plan section)  Acute Rehab PT Goals Patient Stated Goal: I want to go home PT Goal Formulation: Patient unable to participate in goal setting Time For Goal Achievement: 09/29/20 Potential to Achieve Goals: Poor    Frequency Min 2X/week   Barriers to discharge        Co-evaluation               AM-PAC PT "6 Clicks" Mobility  Outcome Measure Help needed turning from your back to your side while in a flat bed without using bedrails?: Total Help needed moving from lying on your back to sitting on the side of a flat bed without using bedrails?: Total Help needed moving to and from a bed to a chair (including a wheelchair)?: Total Help needed standing up from a chair using your arms (e.g., wheelchair or bedside chair)?: Total Help needed to walk in hospital room?: Total Help needed climbing 3-5 steps with a railing? : Total 6 Click Score: 6    End of Session   Activity Tolerance: Patient tolerated treatment well Patient left: in bed;with call bell/phone within reach Nurse Communication: Mobility status PT Visit Diagnosis: Pain;Other abnormalities of gait and mobility (R26.89) Pain - Right/Left: Left Pain - part of body: Leg    Time: [id number] PT Time Calculation (min) (ACUTE ONLY): 18 min   Charges:   PT Evaluation $PT Eval Low Complexity: [facility] Acute Rehabilitation Services Pager [phone] Office [phone]   Claretha Cooper 09/15/2020, 10:00 AM

## 2020-09-16 ENCOUNTER — Encounter (HOSPITAL_COMMUNITY): Payer: Self-pay | Admitting: Family Medicine

## 2020-09-16 DIAGNOSIS — D649 Anemia, unspecified: Secondary | ICD-10-CM | POA: Diagnosis not present

## 2020-09-16 DIAGNOSIS — G9340 Encephalopathy, unspecified: Secondary | ICD-10-CM | POA: Diagnosis not present

## 2020-09-16 DIAGNOSIS — N182 Chronic kidney disease, stage 2 (mild): Secondary | ICD-10-CM | POA: Diagnosis not present

## 2020-09-16 LAB — CBC
HCT: 34 % — ABNORMAL LOW (ref 36.0–46.0)
Hemoglobin: 10.8 g/dL — ABNORMAL LOW (ref 12.0–15.0)
MCH: 29.8 pg (ref 26.0–34.0)
MCHC: 31.8 g/dL (ref 30.0–36.0)
MCV: 93.7 fL (ref 80.0–100.0)
Platelets: 228 10*3/uL (ref 150–400)
RBC: 3.63 MIL/uL — ABNORMAL LOW (ref 3.87–5.11)
RDW: 14.3 % (ref 11.5–15.5)
WBC: 5.4 10*3/uL (ref 4.0–10.5)
nRBC: 0 % (ref 0.0–0.2)

## 2020-09-16 MED ORDER — CEPHALEXIN 500 MG PO CAPS: 500.0000 mg | ORAL_CAPSULE | Freq: Two times a day (BID) | ORAL | 0 refills | Status: AC

## 2020-09-16 MED ORDER — POLYETHYLENE GLYCOL 3350 17 G PO PACK: 17.0000 g | PACK | Freq: Two times a day (BID) | ORAL | Status: AC | PRN

## 2020-09-16 NOTE — Progress Notes (Signed)
Called report at 1700 to Nanine Means [phone]), left a message with secretary for a call back [phone].  Jerene Pitch

## 2020-09-16 NOTE — TOC Transition Note (Addendum)
Transition of Care [facility]) - CM/SW Discharge Note   Patient Details  Name: ELENIE KONTOS MRN: [id number] Date of Birth: September 08, 1932  Transition of Care [facility]) CM/SW Contact:  Ross Ludwig, LCSW Phone Number: 09/16/2020, 2:47 PM   Clinical Narrative:     Patient is from [place] [facility].  CSW contacted ALF to confirm patient can return back today.  CSW spoke to facility, and they said yes.  Discharge summary and FL2 are in patient's discharge packet.  CSW asked ALF if they needed it faxed ahead of time, and they said no to just include it in the packet.  Patient to be d/c'ed today to [place] [facility].  Patient and family agreeable to plans will transport via ems RN to call report [phone].    CSW spoke to patient's daughter Shauna Hugh to discuss home health agencies, she did not have a preference.  [facility] will accept for [facility] PT and OT.  CSW signing off.    Final next level of care: Assisted Living ([facility] on [address]) Barriers to Discharge: Barriers Resolved   Patient Goals and CMS Choice Patient states their goals for this hospitalization and ongoing recovery are:: To return back to [facility]. CMS Medicare.gov Compare Post Acute Care list provided to:: Patient Represenative (must comment) Choice offered to / list presented to : Adult Children  Discharge Placement                  Name of family member notified: Daughter Diane Patient and family notified of of transfer: 09/16/20  Discharge Plan and Services                          HH Arranged: PT, OT [place] Agency: [place] ([facility]) Date HH Agency Contacted: 09/16/20 [place]: [phone] Representative spoke with at [place]: [place] ([facility]) Interventions     Readmission Risk Interventions No flowsheet data found.

## 2020-09-16 NOTE — Discharge Summary (Addendum)
ENT in 2 to 3 weeks forPhysician Discharge Summary  DOROTA KEAVENEY [id number] DOB: 18-May-1932 DOA: 09/14/2020  PCP: Pcp, No  Admit date: 09/14/2020 Discharge date: 09/16/2020  Time spent: 60 minutes  Recommendations for Outpatient Follow-up:  Patient will need outpatient CT chest to evaluate pulmonary nodule Follow-up ENT in 2 weeks to evaluate nondisplaced nasal bone fracture  Continue Keflex 500 mg p.o. twice daily for 5 more days.  Discharge Diagnoses:  Principal Problem:   Acute encephalopathy Active Problems:   Hypertension   Hypothyroidism   Lung nodule   Normocytic anemia   Nasal bone fracture   PAD (peripheral artery disease) (HCC)   CKD (chronic kidney disease), stage II   Discharge Condition: Stable  Diet recommendation: Heart healthy diet  Filed Weights   09/14/20 2157  Weight: 67 kg    History of present illness:  87 year old female with history of hypertension, hypothyroidism, remote left leg sarcoma, peripheral arterial disease presented to ED with confusion and unwitnessed fall. CT head was negative for acute abnormality.  Orbital CT showed minimally displaced nasal bone fracture. Marland Kitchen  Hospital Course:   Acute encephalopathy -Improved; patient is , oriented x3 -CT head is unremarkable -TSH was 3.788 in June -B12 554; ammonia 14    Left lower extremity cellulitis -Patient had erythema and tenderness to palpation in the left lower extremity -She was started on IV ceftriaxone; erythema and tenderness has resolved after starting antibiotics -Will discharge on Keflex 500 mg p.o. twice daily for 5 more days   Fall with nasal bone fracture -Orbital CT scan shows minimally displaced nasal bone fracture -Follow-up ENT in 2 weeks as outpatient   Anemia -Hemoglobin is stable at 10.8   Peripheral arterial disease -Continue aspirin, statin -She was seen by vascular surgery during this admission and family did not think that she would want arteriogram or  intervention   Hypothyroidism -Continue Synthroid   CKD stage II -Creatinine stable at 0.92   Pulmonary nodule -Noted on CT C-spine with increased size and density, concerning for malignancy -Patient's daughter is here of nodule and recommendation for nonemergent CT chest   Procedures:   Consultations:   Discharge Exam: Vitals:   09/16/20 0513 09/16/20 1225  BP: (!) 150/87 (!) 149/86  Pulse: 93 73  Resp: 17 18  Temp: 97.9 F (36.6 C) (!) 97.5 F (36.4 C)  SpO2: 100% 100%    General-appears in no acute distress Heart-S1-S2, regular, no murmur auscultated Lungs-clear to auscultation bilaterally, no wheezing or crackles auscultated Abdomen-soft, nontender, no organomegaly Extremities-no edema in the lower extremities Neuro-alert, oriented x3, no focal deficit noted  Discharge Instructions   Discharge Instructions     Diet - low sodium heart healthy   Complete by: As directed    Increase activity slowly   Complete by: As directed       Allergies as of 09/16/2020   No Known Allergies      Medication List     TAKE these medications    aspirin EC 81 MG tablet Take 81 mg by mouth daily.   benazepril 20 MG tablet Commonly known as: LOTENSIN Take 10 mg by mouth daily.   cephALEXin 500 MG capsule Commonly known as: KEFLEX Take 1 capsule (500 mg total) by mouth 2 (two) times daily for 5 days.   ferrous sulfate 325 (65 FE) MG tablet Take 1 tablet (325 mg total) by mouth every other day.   Fish Oil 1000 MG Caps Take 1,000 mg by mouth daily.  fluticasone 50 MCG/ACT nasal spray Commonly known as: FLONASE Place 1 spray into both nostrils daily as needed for allergies or rhinitis.   levothyroxine 50 MCG tablet Commonly known as: SYNTHROID Take 50 mcg by mouth daily before breakfast.   lovastatin 20 MG tablet Commonly known as: MEVACOR Take 20 mg by mouth at bedtime.   multivitamin with minerals Tabs tablet Take 1 tablet by mouth daily.    polyethylene glycol 17 g packet Commonly known as: MIRALAX / GLYCOLAX Take 17 g by mouth every 12 (twelve) hours as needed (for constipation).   senna-docusate 8.6-50 MG tablet Commonly known as: Senokot-S Take 1 tablet by mouth 2 (two) times daily.   vitamin B-12 1000 MCG tablet Commonly known as: CYANOCOBALAMIN Take 1,000 mcg by mouth daily.       No Known Allergies    The results of significant diagnostics from this hospitalization (including imaging, microbiology, ancillary and laboratory) are listed below for reference.    Significant Diagnostic Studies: CT HEAD WO CONTRAST (5MM)  Result Date: 09/14/2020 CLINICAL DATA:  Head trauma, minor (Age >= 65y). Unwitnessed fall. Periorbital and scalp abrasions. EXAM: CT HEAD WITHOUT CONTRAST TECHNIQUE: Contiguous axial images were obtained from the base of the skull through the vertex without intravenous contrast. COMPARISON:  07/15/2020 FINDINGS: Brain: Normal anatomic configuration. Parenchymal volume loss is commensurate with the patient's age. Moderate periventricular white matter changes are present likely reflecting the sequela of small vessel ischemia, stable since prior examination. No abnormal intra or extra-axial mass lesion or fluid collection. No abnormal mass effect or midline shift. No evidence of acute intracranial hemorrhage or infarct. Ventricular size is normal. Cerebellum unremarkable. Vascular: No asymmetric hyperdense vasculature at the skull base. Skull: Intact Sinuses/Orbits: Paranasal sinuses are clear. Orbits are unremarkable. Other: Mastoid air cells and middle ear cavities are clear. Mild left supraorbital scalp and mild right preseptal soft tissue swelling is present. IMPRESSION: No acute intracranial injury. No calvarial fracture. Mild right periorbital and left scalp soft tissue swelling. Electronically Signed   By: Fidela Salisbury MD   On: 09/14/2020 23:38   CT HEAD WO CONTRAST  Result Date: 09/14/2020 CLINICAL  DATA:  Confusion EXAM: CT HEAD WITHOUT CONTRAST TECHNIQUE: Contiguous axial images were obtained from the base of the skull through the vertex without intravenous contrast. COMPARISON:  07/15/2020 FINDINGS: Brain: There is atrophy and chronic small vessel disease changes. No acute intracranial abnormality. Specifically, no hemorrhage, hydrocephalus, mass lesion, acute infarction, or significant intracranial injury. Vascular: No hyperdense vessel or unexpected calcification. Skull: No acute calvarial abnormality. Sinuses/Orbits: No acute findings Other: None IMPRESSION: Atrophy, chronic microvascular disease. No acute intracranial abnormality. Electronically Signed   By: Rolm Baptise M.D.   On: 09/14/2020 08:25   CT Cervical Spine Wo Contrast  Result Date: 09/15/2020 CLINICAL DATA:  Unwitnessed fall.  Neck trauma. EXAM: CT CERVICAL SPINE WITHOUT CONTRAST TECHNIQUE: Multidetector CT imaging of the cervical spine was performed without intravenous contrast. Multiplanar CT image reconstructions were also generated. COMPARISON:  None. FINDINGS: Alignment: Straightening of normal lordosis. No traumatic subluxation. Skull base and vertebrae: No acute fracture. Vertebral body heights are maintained. The dens and skull base are intact. Bones appear under mineralized Soft tissues and spinal canal: No prevertebral fluid or swelling. No visible canal hematoma. Disc levels: Degenerative disc disease most prominently affecting C5-C6. There is scattered facet hypertrophy. Upper chest: Left apical pulmonary nodule is increasing in size in density, currently 17 mm, previously 16 mm but less dense. This is only partially included  in the field of view. Apical emphysema. Other: Carotid calcifications. IMPRESSION: 1. No acute fracture or subluxation of the cervical spine. 2. Degenerative disc disease and facet hypertrophy. 3. Left apical pulmonary nodule is increasing in size and density, currently 17 mm, previously sub solid.  Findings are suspicious for malignancy. Chest CT again recommended for further assessment, this could be performed on a nonemergent basis. Electronically Signed   By: Keith Rake M.D.   On: 09/15/2020 00:04   US Venous Img Lower Unilateral Left (DVT)  Result Date: 08/31/2020 CLINICAL DATA:  87 year old with left leg pain. EXAM: LEFT LOWER EXTREMITY VENOUS DOPPLER ULTRASOUND TECHNIQUE: Gray-scale sonography with graded compression, as well as color Doppler and duplex ultrasound were performed to evaluate the lower extremity deep venous systems from the level of the common femoral vein and including the common femoral, femoral, profunda femoral, popliteal and calf veins including the posterior tibial, peroneal and gastrocnemius veins when visible. The superficial great saphenous vein was also interrogated. Spectral Doppler was utilized to evaluate flow at rest and with distal augmentation maneuvers in the common femoral, femoral and popliteal veins. COMPARISON:  None. FINDINGS: Contralateral Common Femoral Vein: Respiratory phasicity is normal and symmetric with the symptomatic side. No evidence of thrombus. Normal compressibility. Common Femoral Vein: No evidence of thrombus. Normal compressibility, respiratory phasicity and response to augmentation. Saphenofemoral Junction: No evidence of thrombus. Normal compressibility and flow on color Doppler imaging. Profunda Femoral Vein: No evidence of thrombus. Normal compressibility and flow on color Doppler imaging. Femoral Vein: No evidence of thrombus. Normal compressibility, respiratory phasicity and response to augmentation. Popliteal Vein: No evidence of thrombus. Normal compressibility, respiratory phasicity and response to augmentation. Calf Veins: Limited evaluation. Other Findings:  None. IMPRESSION: Negative for deep venous thrombosis in left lower extremity. Limited evaluation of left deep calf veins. Electronically Signed   By: Markus Daft M.D.   On:  08/31/2020 07:50   VAS [place] ABI WITH/WO TBI  Result Date: 08/31/2020  LOWER EXTREMITY DOPPLER STUDY Patient Name:  VALICIA PRINZO  Date of Exam:   08/31/2020 Medical Rec #: [id number]       Accession #:    [id number] Date of Birth: 31-Aug-1932       Patient Gender: F Patient Age:   88Y Exam Location:  [facility] Procedure:      VAS [place] ABI WITH/WO TBI Referring Phys: [id number] [place] --------------------------------------------------------------------------------  Indications: Peripheral artery disease. High Risk Factors: Hypertension, hyperlipidemia.  Limitations: Today's exam was limited due to unable to tolerate compression on              legs, left arm contracted. Comparison Study: no prior Performing Technologist: Archie Patten RVS  Examination Guidelines: A complete evaluation includes at minimum, Doppler waveform signals and systolic blood pressure reading at the level of bilateral brachial, anterior tibial, and posterior tibial arteries, when vessel segments are accessible. Bilateral testing is considered an integral part of a complete examination. Photoelectric Plethysmograph (PPG) waveforms and toe systolic pressure readings are included as required and additional duplex testing as needed. Limited examinations for reoccurring indications may be performed as noted.  ABI Findings: +--------+------------------+-----+---------+--------+ Right   Rt Pressure (mmHg)IndexWaveform Comment  +--------+------------------+-----+---------+--------+ [id number]                    triphasic         +--------+------------------+-----+---------+--------+ PTA  triphasic         +--------+------------------+-----+---------+--------+ DP                             triphasic         +--------+------------------+-----+---------+--------+ +--------+-----------------+-----+------------------+--------------------------+ Left    Lt Pressure      IndexWaveform           Comment                            (mmHg)                                                             +--------+-----------------+-----+------------------+--------------------------+ Brachial                                        unable to obtain-                                                          retracted                  +--------+-----------------+-----+------------------+--------------------------+ PTA                           dampened                                                                   monophasic                                   +--------+-----------------+-----+------------------+--------------------------+ DP                            absent                                       +--------+-----------------+-----+------------------+--------------------------+  Summary: Right: Unable to obtain ABI due to pt pain tolerance. Left: Unable to obtain ABI due to pt pain tolerance.  *See table(s) above for measurements and observations.  Electronically signed by Deitra Mayo MD on 08/31/2020 at 5:16:21 PM.    Final    CT Orbits Wo Contrast  Result Date: 09/14/2020 CLINICAL DATA:  Facial trauma, abrasion right periorbital region and left temporal area, un witnessed fall EXAM: CT ORBITS WITHOUT CONTRAST TECHNIQUE: Multidetector CT imaging of the orbits was performed using the standard protocol without intravenous contrast. Multiplanar CT image reconstructions were also generated. COMPARISON:  07/15/2020 FINDINGS: Orbits: No orbital mass or evidence of inflammation. Normal appearance of the globes, optic nerve-sheath complexes,  extraocular muscles, orbital fat and lacrimal glands. Visible paranasal sinuses: Mild polypoid mucosal thickening within the inferior aspect of the maxillary sinuses. Remaining sinuses are clear. Soft tissues: Mild soft tissue swelling in the right periorbital region. Remaining soft tissues are unremarkable. Osseous: There  is a minimally displaced fracture along the inferior aspect right side nasal bone. No other acute displaced fractures. Limited intracranial: No acute or significant finding. IMPRESSION: 1. Small fracture off the inferior aspect right side nasal bone, with mild adjacent soft tissue swelling. Electronically Signed   By: Randa Ngo M.D.   On: 09/14/2020 23:36    Microbiology: Recent Results (from the past 240 hour(s))  Resp Panel by RT-PCR (Flu A&B, Covid) Nasopharyngeal Swab     Status: None   Collection Time: 09/14/20  7:51 AM   Specimen: Nasopharyngeal Swab; Nasopharyngeal(NP) swabs in vial transport medium  Result Value Ref Range Status   SARS Coronavirus 2 by RT PCR NEGATIVE NEGATIVE Final    Comment: (NOTE) SARS-CoV-2 target nucleic acids are NOT DETECTED.  The SARS-CoV-2 RNA is generally detectable in upper respiratory specimens during the acute phase of infection. The lowest concentration of SARS-CoV-2 viral copies this assay can detect is 138 copies/mL. A negative result does not preclude SARS-Cov-2 infection and should not be used as the sole basis for treatment or other patient management decisions. A negative result may occur with  improper specimen collection/handling, submission of specimen other than nasopharyngeal swab, presence of viral mutation(s) within the areas targeted by this assay, and inadequate number of viral copies(<138 copies/mL). A negative result must be combined with clinical observations, patient history, and epidemiological information. The expected result is Negative.  Fact Sheet for Patients:  EntrepreneurPulse.com.au  Fact Sheet for Healthcare Providers:  IncredibleEmployment.be  This test is no t yet approved or cleared by the [place] FDA and  has been authorized for detection and/or diagnosis of SARS-CoV-2 by FDA under an Emergency Use Authorization (EUA). This EUA will remain  in effect (meaning this  test can be used) for the duration of the COVID-19 declaration under Section 564(b)(1) of the Act, 21 U.S.C.section 360bbb-3(b)(1), unless the authorization is terminated  or revoked sooner.       Influenza A by PCR NEGATIVE NEGATIVE Final   Influenza B by PCR NEGATIVE NEGATIVE Final    Comment: (NOTE) The Xpert Xpress SARS-CoV-2/FLU/RSV plus assay is intended as an aid in the diagnosis of influenza from Nasopharyngeal swab specimens and should not be used as a sole basis for treatment. Nasal washings and aspirates are unacceptable for Xpert Xpress SARS-CoV-2/FLU/RSV testing.  Fact Sheet for Patients: EntrepreneurPulse.com.au  Fact Sheet for Healthcare Providers: IncredibleEmployment.be  This test is not yet approved or cleared by the [place] FDA and has been authorized for detection and/or diagnosis of SARS-CoV-2 by FDA under an Emergency Use Authorization (EUA). This EUA will remain in effect (meaning this test can be used) for the duration of the COVID-19 declaration under Section 564(b)(1) of the Act, 21 U.S.C. section 360bbb-3(b)(1), unless the authorization is terminated or revoked.  Performed at [facility], [place] [address], [place], [place] [address]      Labs: Basic Metabolic Panel: Recent Labs  Lab 09/14/20 0749 09/15/20 0523  NA 142 140  K 4.6 4.3  CL 108 107  CO2 25 27  GLUCOSE 91 93  BUN 18 17  CREATININE 0.93 0.92  CALCIUM 7.9* 8.9   Liver Function Tests: Recent Labs  Lab 09/14/20 0749  AST  19  ALT 17  ALKPHOS 67  BILITOT 0.7  PROT 7.6  ALBUMIN 3.3*   No results for input(s): LIPASE, AMYLASE in the last 168 hours. Recent Labs  Lab 09/15/20 0522  AMMONIA 14   CBC: Recent Labs  Lab 09/14/20 0749 09/14/20 2311 09/15/20 0523 09/16/20 0502  WBC 4.8 5.3 4.6 5.4  NEUTROABS 3.1  --   --   --   HGB 10.2* 9.5* 9.8* 10.8*  HCT 31.6* 29.6* 30.7* 34.0*  MCV 92.1 93.4 94.2 93.7   PLT 239 219 223 228      Signed:  Oswald Hillock MD.  [facility] 09/16/2020, 1:05 PM

## 2020-09-16 NOTE — Progress Notes (Signed)
Pt AVS printed, Iv removed earlier for infiltration. Jerene Pitch

## 2020-09-16 NOTE — Plan of Care (Signed)
  Problem: Activity: Goal: Risk for activity intolerance will decrease Outcome: Progressing   Problem: Nutrition: Goal: Adequate nutrition will be maintained Outcome: Progressing   Problem: Coping: Goal: Level of anxiety will decrease Outcome: Progressing   

## 2020-09-16 NOTE — Progress Notes (Signed)
Called report to [place], Spoke with Alben Deeds.  Jerene Pitch

## 2020-09-16 NOTE — NC FL2 (Addendum)
[place] LEVEL OF CARE SCREENING TOOL     IDENTIFICATION  Patient Name: Amber [name] Birthdate: 1932/06/11 Sex: female Admission Date (Current Location): 09/14/2020  [facility] and [place] Number:  Herbalist and Address:  [facility],  [place], [place]      Provider Number: [id number]  Attending Physician Name and Address:  Oswald Hillock, MD  Relative Name and Phone Number:  Donzetta Kohut Daughter   [phone]  Anselm Pancoast   [phone]  [place], Coll Relative   [id number]    Current Level of Care: Hospital Recommended Level of Care: [place] ([facility] on [address]) Prior Approval Number:    Date Approved/Denied:   PASRR Number:    Discharge Plan: Domiciliary (Rest home) ([facility] on [address])    Current Diagnoses: Patient Active Problem List   Diagnosis Date Noted   Acute encephalopathy 09/15/2020   Lung nodule 09/15/2020   Normocytic anemia 09/15/2020   Nasal bone fracture 09/15/2020   PAD (peripheral artery disease) ([place]) 09/15/2020   CKD (chronic kidney disease), stage II 09/15/2020   Anemia    Lower limb ischemia 08/31/2020   hx of COVID-19 07/15/2020   AKI (acute kidney injury) ([place]) 07/15/2020   Proximal Femur Fracture, left [place] Fall 10/19/2017   Femur fracture ([place]) 10/19/2017   Dizziness 01/19/2016   Fall 01/19/2016   Closed right hip fracture ([place]) 01/19/2016   Hypertension 01/19/2016   Hypothyroidism 01/19/2016   Hyperlipidemia 01/19/2016   Hip fracture ([place]) 01/19/2016   Status post laparoscopic cholecystectomy 03/27/2013    Orientation RESPIRATION BLADDER Height & Weight     Self, Time, Situation, Place  Normal Continent Weight: 147 lb 11.3 oz (67 kg) Height:  '5\' 8"'$  (172.7 cm)  BEHAVIORAL SYMPTOMS/MOOD NEUROLOGICAL BOWEL NUTRITION STATUS      Continent Diet (Regular diet)  AMBULATORY STATUS COMMUNICATION OF NEEDS Skin   Supervision Verbally  Normal                       Personal Care Assistance Level of Assistance  Bathing, Feeding, Dressing Bathing Assistance: Limited assistance Feeding assistance: Independent Dressing Assistance: Limited assistance     Functional Limitations Info  Hearing, Speech, Sight Sight Info: Adequate Hearing Info: Adequate Speech Info: Adequate    SPECIAL CARE FACTORS FREQUENCY                       Contractures Contractures Info: Not present    Additional Factors Info    Code Status Info: DNR Allergies Info: NKA           Current Medications (09/16/2020):  This is the current hospital active medication list Current Facility-Administered Medications  Medication Dose Route Frequency Provider Last Rate Last Admin   acetaminophen (TYLENOL) tablet 650 mg  650 mg Oral Q6H PRN Opyd, Ilene Qua, MD   650 mg at 09/16/20 1115   Or   acetaminophen (TYLENOL) suppository 650 mg  650 mg Rectal Q6H PRN Opyd, Ilene Qua, MD       aspirin EC tablet 81 mg  81 mg Oral Daily Opyd, Ilene Qua, MD   81 mg at 09/16/20 1116   cefTRIAXone (ROCEPHIN) 1 g in sodium chloride 0.9 % 100 mL IVPB  1 g Intravenous Q24H Oswald Hillock, MD 200 mL/hr at 09/16/20 1115 1 g at 09/16/20 1115   enoxaparin (LOVENOX) injection 40 mg  40 mg Subcutaneous Q24H Opyd, Ilene Qua, MD  40 mg at 09/16/20 1116   levothyroxine (SYNTHROID) tablet 50 mcg  50 mcg Oral Q0600 Minda Ditto, RPH   50 mcg at 09/16/20 0526   ondansetron (ZOFRAN) tablet 4 mg  4 mg Oral Q6H PRN Opyd, Ilene Qua, MD       Or   ondansetron (ZOFRAN) injection 4 mg  4 mg Intravenous Q6H PRN Opyd, Ilene Qua, MD       polyethylene glycol (MIRALAX / GLYCOLAX) packet 17 g  17 g Oral Daily PRN Opyd, Ilene Qua, MD       pravastatin (PRAVACHOL) tablet 20 mg  20 mg Oral q1800 Opyd, Ilene Qua, MD         Discharge Medications: TAKE these medications     aspirin EC 81 MG tablet Take 81 mg by mouth daily.    benazepril 20 MG tablet Commonly known as:  LOTENSIN Take 10 mg by mouth daily.    cephALEXin 500 MG capsule Commonly known as: KEFLEX Take 1 capsule (500 mg total) by mouth 2 (two) times daily for 5 days.    ferrous sulfate 325 (65 FE) MG tablet Take 1 tablet (325 mg total) by mouth every other day.    Fish Oil 1000 MG Caps Take 1,000 mg by mouth daily.    fluticasone 50 MCG/ACT nasal spray Commonly known as: FLONASE Place 1 spray into both nostrils daily as needed for allergies or rhinitis.    levothyroxine 50 MCG tablet Commonly known as: SYNTHROID Take 50 mcg by mouth daily before breakfast.    lovastatin 20 MG tablet Commonly known as: MEVACOR Take 20 mg by mouth at bedtime.    multivitamin with minerals Tabs tablet Take 1 tablet by mouth daily.    polyethylene glycol 17 g packet Commonly known as: MIRALAX / GLYCOLAX Take 17 g by mouth every 12 (twelve) hours as needed (for constipation).    senna-docusate 8.6-50 MG tablet Commonly known as: Senokot-S Take 1 tablet by mouth 2 (two) times daily.    vitamin B-12 1000 MCG tablet Commonly known as: CYANOCOBALAMIN Take 1,000 mcg by mouth daily.    Relevant Imaging Results:  Relevant Lab Results:   Additional Information    Peter Keyworth, Jones Broom, LCSW

## 2020-09-29 ENCOUNTER — Other Ambulatory Visit: Payer: Self-pay

## 2020-09-29 ENCOUNTER — Ambulatory Visit (INDEPENDENT_AMBULATORY_CARE_PROVIDER_SITE_OTHER): Payer: Medicare Other | Admitting: Vascular Surgery

## 2020-09-29 ENCOUNTER — Encounter: Payer: Self-pay | Admitting: Vascular Surgery

## 2020-09-29 VITALS — BP 127/75 | HR 71 | Temp 97.9°F | Resp 20 | Ht 68.0 in | Wt 147.0 lb

## 2020-09-29 DIAGNOSIS — I739 Peripheral vascular disease, unspecified: Secondary | ICD-10-CM

## 2020-09-29 NOTE — Progress Notes (Signed)
REASON FOR VISIT:   Follow-up of peripheral vascular disease  MEDICAL ISSUES:   PERIPHERAL VASCULAR DISEASE: The patient has developed new wounds on the left leg.  We discussed the option of proceeding with arteriography in hopes of finding an endovascular approach however, I think the chance of finding something to do from an endovascular standpoint was small given that she has diffuse infrainguinal disease.  In addition even if she underwent successful revascularization given the extent of the wounds I am not sure that the left leg is salvageable.  After extensive discussion with the patient and her daughter we agree that the best option would be left above-the-knee amputation if the wounds progress versus palliative care.  She is undergone extensive surgery on the left leg for tumor excision and there is really very little soft tissue anterior to the femur which I think would make an above-the-knee amputation complicated.  If she had healing problems she could even possibly require a very proximal amputation.  For this reason we will refer her to Dr. Meridee Score to evaluate her for possible left AKA.  I have written her prescription for Keflex for the cellulitis in the left foot.  HPI:   Amber [name] is a pleasant 87 y.o. female who I saw in consultation in the hospital on 08/31/2020 with chronic left lower extremity ischemia.  Patient resides in a skilled nursing facility.  She is nonambulatory.  She had pain in her left heel for over a week.  She had evidence of chronic left lower extremity ischemia with evidence of significant infrainguinal arterial occlusive disease.  I recommended arteriography.  I felt that she might be a candidate for an endovascular approach although she would not be a good candidate for surgery.  Ultimately the patient decided she did not want to proceed with arteriography.  Of note, in the hospital her GFR was 53 with a creatinine 1.03.  Since I saw her in the  hospital she has developed 2 new wounds on her left leg.  She describes some rest pain in her left foot.  She is markedly debilitated and resides in a skilled nursing facility.  She is nonambulatory.  She has undergone extensive surgery for a tumor in her left thigh.  Past Medical History:  Diagnosis Date   Complication of anesthesia    "woke up jumping and bouncing"   COVID-19    Hyperlipidemia    Hypertension    sarcoma of lt leg dx'd 1989   xrt and chemo and surg   Thyroid disease     Family History  Problem Relation Age of Onset   Lung cancer Sister    Bone cancer Brother    Stomach cancer Sister     SOCIAL HISTORY: Social History   Tobacco Use   Smoking status: Former    Types: Cigarettes    Quit date: 03/27/1983    Years since quitting: 37.5   Smokeless tobacco: Never  Substance Use Topics   Alcohol use: No    No Known Allergies  Current Outpatient Medications  Medication Sig Dispense Refill   aspirin EC 81 MG tablet Take 81 mg by mouth daily.     benazepril (LOTENSIN) 20 MG tablet Take 10 mg by mouth daily.     ferrous sulfate 325 (65 FE) MG tablet Take 1 tablet (325 mg total) by mouth every other day. 60 tablet 0   fluticasone (FLONASE) 50 MCG/ACT nasal spray Place 1 spray into both nostrils daily  as needed for allergies or rhinitis.     levothyroxine (SYNTHROID) 50 MCG tablet Take 50 mcg by mouth daily before breakfast.     lovastatin (MEVACOR) 20 MG tablet Take 20 mg by mouth at bedtime.     Multiple Vitamin (MULTIVITAMIN WITH MINERALS) TABS tablet Take 1 tablet by mouth daily.     Omega-3 Fatty Acids (FISH OIL) 1000 MG CAPS Take 1,000 mg by mouth daily.     polyethylene glycol (MIRALAX / GLYCOLAX) 17 g packet Take 17 g by mouth every 12 (twelve) hours as needed (for constipation).     senna-docusate (SENOKOT-S) 8.6-50 MG tablet Take 1 tablet by mouth 2 (two) times daily. 30 tablet 0   vitamin B-12 (CYANOCOBALAMIN) 1000 MCG tablet Take 1,000 mcg by mouth  daily.     No current facility-administered medications for this visit.    REVIEW OF SYSTEMS:  '[X]'$  denotes positive finding, '[ ]'$  denotes negative finding Cardiac  Comments:  Chest pain or chest pressure:    Shortness of breath upon exertion:    Short of breath when lying flat:    Irregular heart rhythm:        Vascular    Pain in calf, thigh, or hip brought on by ambulation:    Pain in feet at night that wakes you up from your sleep:  x   Blood clot in your veins:    Leg swelling:         Pulmonary    Oxygen at home:    Productive cough:     Wheezing:         Neurologic    Sudden weakness in arms or legs:     Sudden numbness in arms or legs:     Sudden onset of difficulty speaking or slurred speech:    Temporary loss of vision in one eye:     Problems with dizziness:         Gastrointestinal    Blood in stool:     Vomited blood:         Genitourinary    Burning when urinating:     Blood in urine:        Psychiatric    Major depression:         Hematologic    Bleeding problems:    Problems with blood clotting too easily:        Skin    Rashes or ulcers:        Constitutional    Fever or chills:     PHYSICAL EXAM:   Vitals:   09/29/20 1354  BP: 127/75  Pulse: 71  Resp: 20  Temp: 97.9 F (36.6 C)  SpO2: 96%  Weight: 147 lb (66.7 kg)  Height: '5\' 8"'$  (1.727 m)    GENERAL: The patient is a markedly debilitated female, in no acute distress. The vital signs are documented above. CARDIAC: There is a regular rate and rhythm.  VASCULAR: I do not detect carotid bruits. She has palpable femoral pulses. I cannot palpate pedal pulses. She has ischemic changes to the left foot. PULMONARY: There is good air exchange bilaterally without wheezing or rales. ABDOMEN: Soft and non-tender with normal pitched bowel sounds.  MUSCULOSKELETAL: There are no major deformities or cyanosis. NEUROLOGIC: No focal weakness or paresthesias are detected. SKIN: She has a wound  on her left pretibial area and also adjacent to her left knee.       PSYCHIATRIC: The patient has a normal affect.  DATA:    ARTERIAL DOPPLER STUDY: I reviewed arterial Doppler study that was done in the hospital in July.  On the right side there is triphasic Doppler signals in the foot.  On the left side there was a dampened monophasic PT signal only.  A total of 30 minutes was spent on this visit. 15 minutes was face to face time. More than 50% of the time was spent on counseling and coordinating with the patient.    Deitra Mayo [facility] [phone]

## 2020-10-05 ENCOUNTER — Ambulatory Visit: Payer: Self-pay

## 2020-10-05 ENCOUNTER — Encounter: Payer: Self-pay | Admitting: Orthopedic Surgery

## 2020-10-05 ENCOUNTER — Ambulatory Visit (INDEPENDENT_AMBULATORY_CARE_PROVIDER_SITE_OTHER): Payer: Medicare Other | Admitting: Orthopedic Surgery

## 2020-10-05 DIAGNOSIS — L97929 Non-pressure chronic ulcer of unspecified part of left lower leg with unspecified severity: Secondary | ICD-10-CM | POA: Diagnosis not present

## 2020-10-05 DIAGNOSIS — I739 Peripheral vascular disease, unspecified: Secondary | ICD-10-CM

## 2020-10-05 DIAGNOSIS — M79605 Pain in left leg: Secondary | ICD-10-CM

## 2020-10-05 DIAGNOSIS — I87332 Chronic venous hypertension (idiopathic) with ulcer and inflammation of left lower extremity: Secondary | ICD-10-CM

## 2020-10-05 NOTE — Progress Notes (Signed)
Office Visit Note   Patient: Amber [name]           Date of Birth: 1932-11-11           MRN: [id number] Visit Date: 10/05/2020              Requested by: Roetta Sessions, NP [address] [place],  [place] [address] PCP: Roetta Sessions, NP  Chief Complaint  Patient presents with   Left Leg - Pain      HPI: Patient is an 87 year old woman who is seen for initial evaluation for ischemic ulcerations to the left leg.  Patient is status post muscle resection for cancer in the early 84s at [facility] with Dr. Donnella Sham.  Patient underwent topical chemotherapy which caused calcified masses in her thigh.  Patient now has a mixed venous and arterial ulcer over the left leg she has been seen by Dr. Scot Dock at vascular vein surgery and she is not a revascularization candidate.  Assessment & Plan: Visit Diagnoses:  1. Pain in left leg   2. PVD (peripheral vascular disease) ([place])   3. Chronic venous hypertension (idiopathic) with ulcer and inflammation of left lower extremity (HCC)     Plan: Discussed with the patient that with the chemotherapy and cancer treatment of the thigh patient has extremely high likelihood that she would not heal a above-the-knee amputation incision.  We will try with a Dynaflex compression wrap to see if that will help heal the mixed venous and arterial ulcer on the left leg.  If we are making progress we can proceed with knee-high compression stockings.  Follow-Up Instructions: Return in about 1 week (around 10/12/2020).   Ortho Exam  Patient is alert, oriented, no adenopathy, well-dressed, normal affect, normal respiratory effort. Examination patient does not have a palpable femoral or popliteal pulse.  Her left thigh is cold to the touch she has multiple incisions on the left upper extremity.  She has a necrotic ulcer over the tibial tubercle with venous swelling.  She has venous swelling in both feet.  She has a mixed venous and arterial  insufficiency she is currently on Keflex 500 mg 3 times a day.  Imaging: XR FEMUR MIN 2 VIEWS LEFT  Result Date: 10/05/2020 2 view radiographs of the left femur shows stable hemiarthroplasty with calcification of soft tissue and lytic bony changes.  This seems consistent with her previous chemotherapy.  No images are attached to the encounter.  Labs: Lab Results  Component Value Date   HGBA1C 5.1 08/31/2020   REPTSTATUS 07/06/2020 FINAL 07/05/2020   CULT  07/05/2020    NO GROWTH Performed at [facility], [place] [address] [place], [place] [address]      Lab Results  Component Value Date   ALBUMIN 3.3 (L) 09/14/2020   ALBUMIN 2.8 (L) 07/16/2020   ALBUMIN 3.2 (L) 07/15/2020    Lab Results  Component Value Date   MG 2.1 10/22/2017   No results found for: VD25OH  No results found for: PREALBUMIN CBC EXTENDED Latest Ref Rng & Units 09/16/2020 09/15/2020 09/14/2020  WBC 4.0 - 10.5 K/uL 5.4 4.6 5.3  RBC 3.87 - 5.11 MIL/uL 3.63(L) 3.26(L) 3.17(L)  HGB 12.0 - 15.0 g/dL 10.8(L) 9.8(L) 9.5(L)  HCT 36.0 - 46.0 % 34.0(L) 30.7(L) 29.6(L)  PLT 150 - 400 K/uL 228 223 219  NEUTROABS 1.7 - 7.7 K/uL - - -  LYMPHSABS 0.7 - 4.0 K/uL - - -  There is no height or weight on file to calculate BMI.  Orders:  Orders Placed This Encounter  Procedures   XR FEMUR MIN 2 VIEWS LEFT   No orders of the defined types were placed in this encounter.    Procedures: No procedures performed  Clinical Data: No additional findings.  ROS:  All other systems negative, except as noted in the HPI. Review of Systems  Objective: Vital Signs: There were no vitals taken for this visit.  Specialty Comments:  No specialty comments available.  PMFS History: Patient Active Problem List   Diagnosis Date Noted   Acute encephalopathy 09/15/2020   Lung nodule 09/15/2020   Normocytic anemia 09/15/2020   Nasal bone fracture 09/15/2020   PAD (peripheral artery disease) ([place]) 09/15/2020   CKD  (chronic kidney disease), stage II 09/15/2020   Anemia    Lower limb ischemia 08/31/2020   hx of COVID-19 07/15/2020   AKI (acute kidney injury) ([place]) 07/15/2020   Proximal Femur Fracture, left /Mechanical Fall 10/19/2017   Femur fracture ([place]) 10/19/2017   Dizziness 01/19/2016   Fall 01/19/2016   Closed right hip fracture ([place]) 01/19/2016   Hypertension 01/19/2016   Hypothyroidism 01/19/2016   Hyperlipidemia 01/19/2016   Hip fracture ([place]) 01/19/2016   Status post laparoscopic cholecystectomy 03/27/2013   Past Medical History:  Diagnosis Date   Complication of anesthesia    "woke up jumping and bouncing"   COVID-19    Hyperlipidemia    Hypertension    sarcoma of lt leg dx'd 1989   xrt and chemo and surg   Thyroid disease     Family History  Problem Relation Age of Onset   Lung cancer Sister    Bone cancer Brother    Stomach cancer Sister     Past Surgical History:  Procedure Laterality Date   ABDOMINAL HYSTERECTOMY     CHOLECYSTECTOMY N/A 03/27/2013   Procedure: LAPAROSCOPIC CHOLECYSTECTOMY WITH INTRAOPERATIVE CHOLANGIOGRAM;  Surgeon: Pedro Earls, MD;  Location: [facility];  Service: General;  Laterality: N/A;   HIP ARTHROPLASTY Right 01/20/2016   Procedure: ARTHROPLASTY BIPOLAR HIP (HEMIARTHROPLASTY);  Surgeon: Renette Butters, MD;  Location: [facility];  Service: Orthopedics;  Laterality: Right;   HIP ARTHROPLASTY Left 10/20/2017   Procedure: ARTHROPLASTY BIPOLAR HIP (HEMIARTHROPLASTY);  Surgeon: Hiram Gash, MD;  Location: [facility];  Service: Orthopedics;  Laterality: Left;   KNEE SURGERY Left    approx 4 surgeries on left knee   Social History   Occupational History   Not on file  Tobacco Use   Smoking status: Former    Types: Cigarettes    Quit date: 03/27/1983    Years since quitting: 37.5   Smokeless tobacco: Never  Vaping Use   Vaping Use: Never used  Substance and Sexual Activity   Alcohol use: No   Drug use: No   Sexual activity: Not on file

## 2020-10-07 ENCOUNTER — Other Ambulatory Visit: Payer: Self-pay

## 2020-10-07 ENCOUNTER — Telehealth: Payer: Self-pay | Admitting: Orthopedic Surgery

## 2020-10-07 DIAGNOSIS — M79605 Pain in left leg: Secondary | ICD-10-CM

## 2020-10-07 DIAGNOSIS — L97929 Non-pressure chronic ulcer of unspecified part of left lower leg with unspecified severity: Secondary | ICD-10-CM

## 2020-10-07 DIAGNOSIS — I739 Peripheral vascular disease, unspecified: Secondary | ICD-10-CM

## 2020-10-07 DIAGNOSIS — I87332 Chronic venous hypertension (idiopathic) with ulcer and inflammation of left lower extremity: Secondary | ICD-10-CM

## 2020-10-07 NOTE — Telephone Encounter (Signed)
Beverlee Nims pts daughter called stating she really would like to speak with Autumn and she would also like to get a referral for the pt. Beverlee Nims states the pt is moving to [place] and wants the referral sent to Dr. Scherrie Bateman; she would like a CB to discuss both matters please.   [phone]

## 2020-10-07 NOTE — Telephone Encounter (Signed)
Pt daughter called and states its urgent the pt has taken off her compression wrap. She wants to know what to do?   CB [phone]  [phone]-- husband

## 2020-10-07 NOTE — Telephone Encounter (Signed)
Calld and sw pt's daughter advised that she could remove the remaninig dressing and apply kerlix and acebandage and change daily. Referral has been placed  for vascular and will call with questions.

## 2020-10-08 ENCOUNTER — Telehealth: Payer: Self-pay | Admitting: Orthopedic Surgery

## 2020-10-08 NOTE — Telephone Encounter (Signed)
Pt daughter called and states the referral for vascular surgery needs to be sent to [facility] in [place].

## 2020-10-11 NOTE — Telephone Encounter (Signed)
Faxed order/Referral to [facility] [place] Vascular surgery to Dr. Scherrie Bateman at [phone], they will contact pt to schedule appt

## 2020-10-12 ENCOUNTER — Ambulatory Visit: Payer: Medicare Other | Admitting: Orthopedic Surgery

## 2021-04-13 DEATH — deceased

## 2022-03-10 IMAGING — CT CT HEAD W/O CM
3 series · 15 of 47 positions shown, 18 images · non-contrast
Comparison: 03/08/2019

CLINICAL DATA: Fell, posterior scalp hematoma

EXAM:
CT HEAD WITHOUT CONTRAST
CT CERVICAL SPINE WITHOUT CONTRAST
TECHNIQUE: Multidetector CT imaging of the head and cervical spine was
performed following the standard protocol without intravenous
contrast. Multiplanar CT image reconstructions of the cervical spine
were also generated.

[Series 3: head wo · axial · 0.43mm/px · z∈[+1496,+1626]mm · 9 of 32 slices shown, 12 images]
[im 3/32  brain]
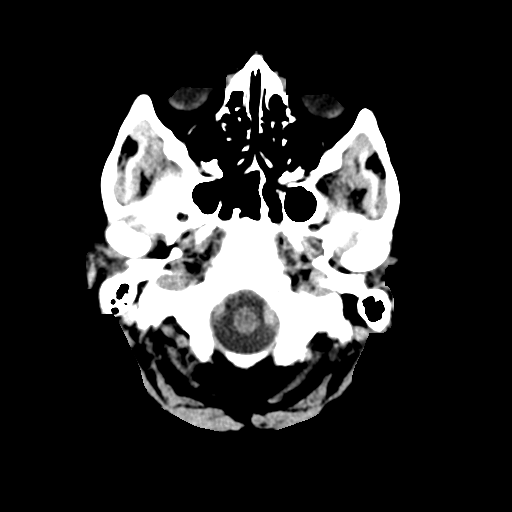
[im 3/32  bone]
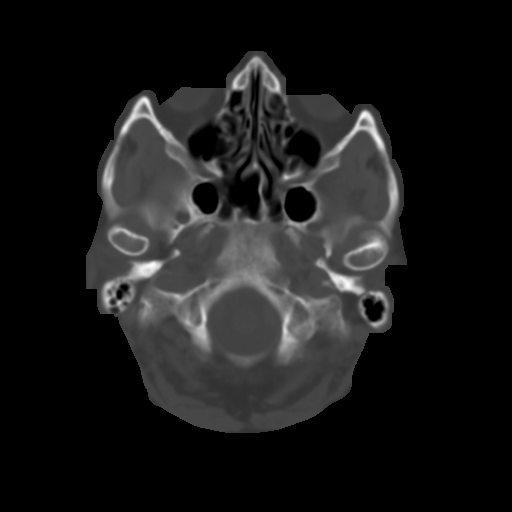
[im 6/32  brain]
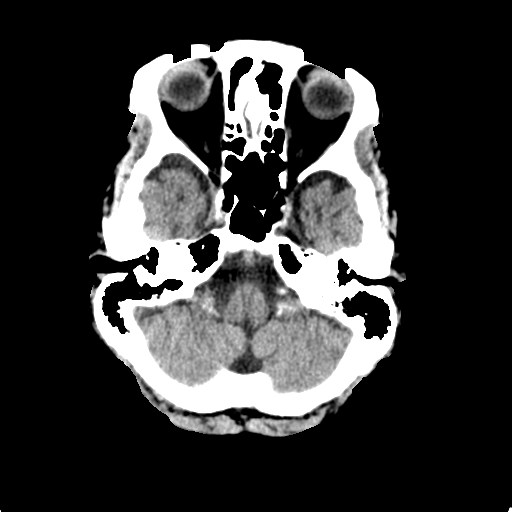
[im 9/32  brain]
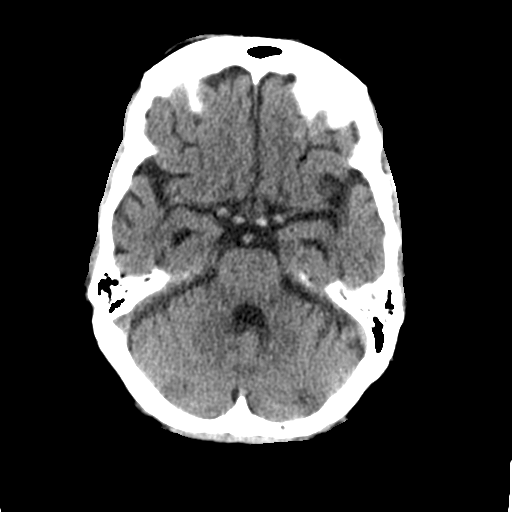
[im 12/32  brain]
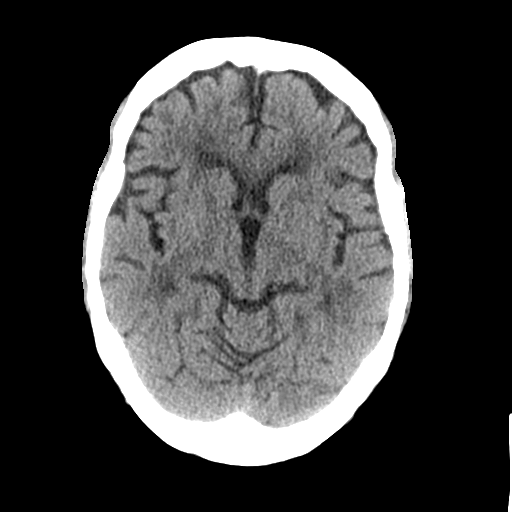
[im 17/32  brain]
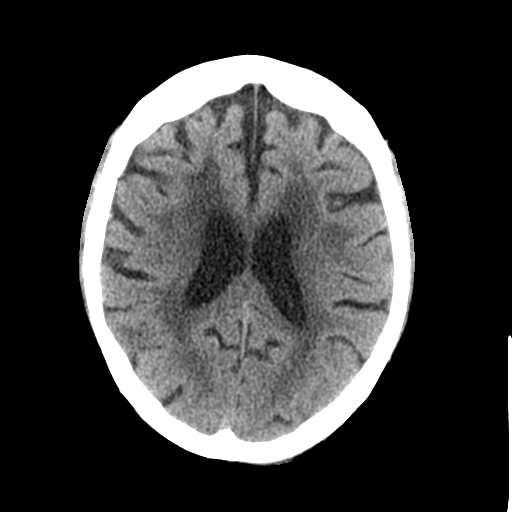
[im 17/32  bone]
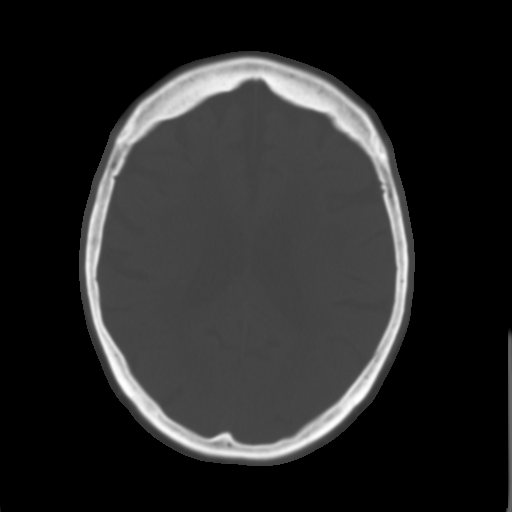
[im 20/32  brain]
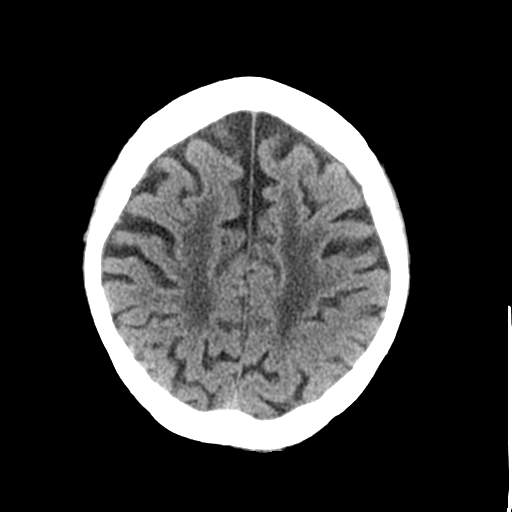
[im 23/32  brain]
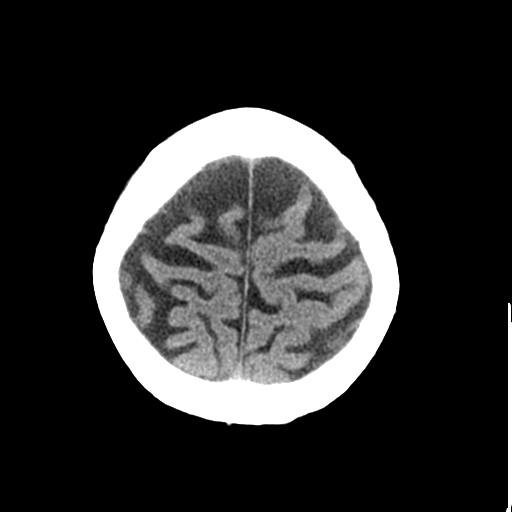
[im 26/32  brain]
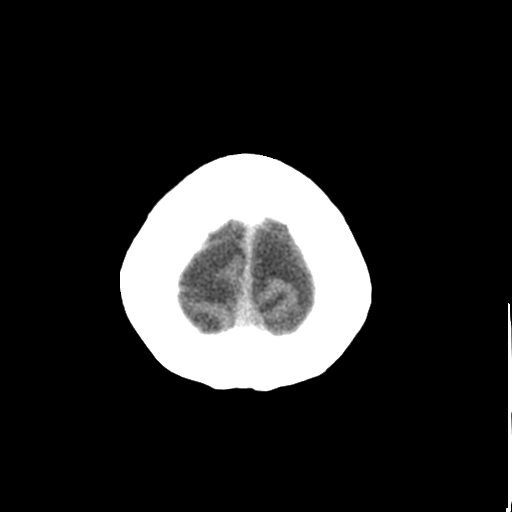
[im 29/32  brain]
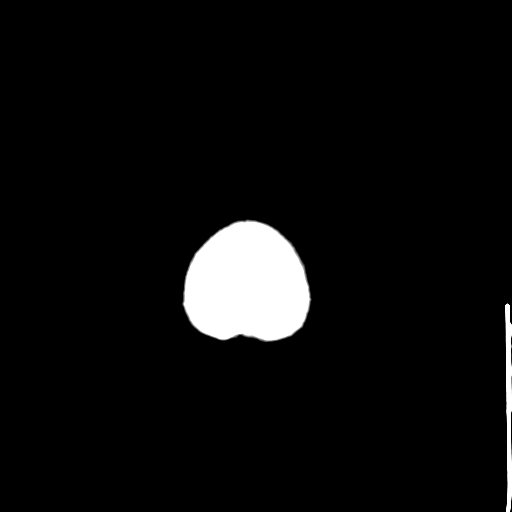
[im 29/32  bone]
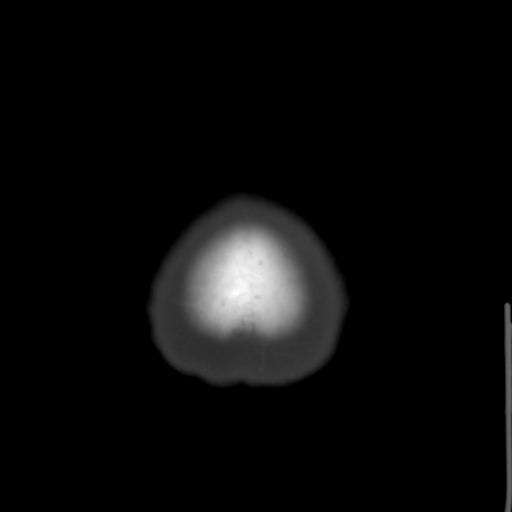

[Series 6: coronal soft tissue · coronal · 0.30mm/px · 3 of 65 slices shown]
[im 22/65  brain]
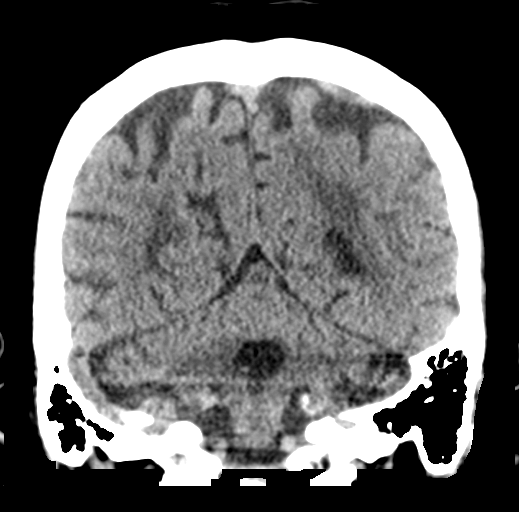
[im 29/65  brain]
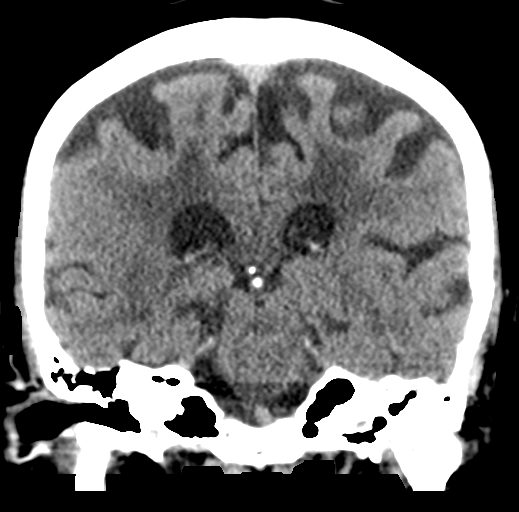
[im 36/65  brain]
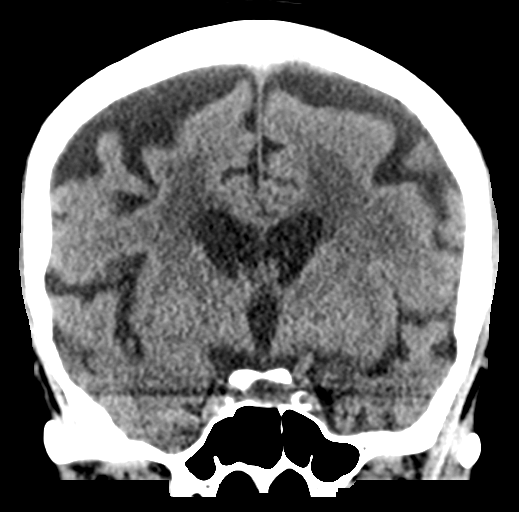

[Series 7: sagittal soft tissue · sagittal · 0.30mm/px · 3 of 52 slices shown]
[im 18/52  brain]
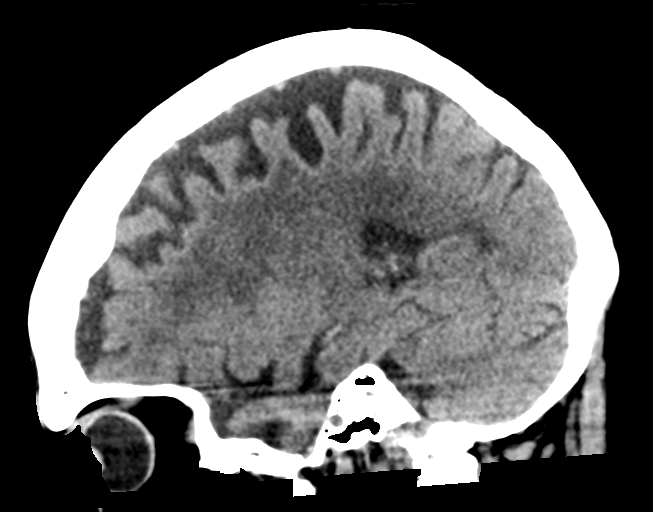
[im 26/52  brain]
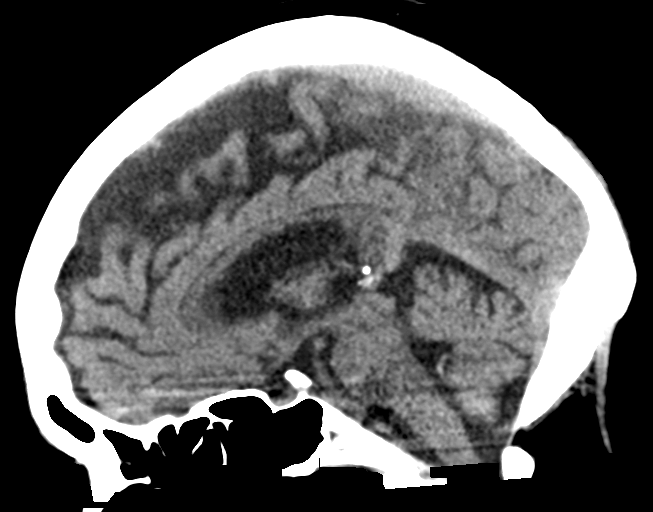
[im 35/52  brain]
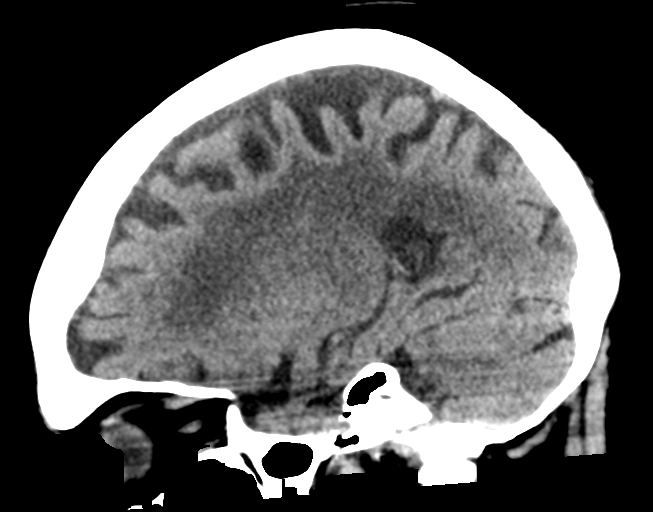

[15 of 47 positions shown; findings below may reference images not displayed]

FINDINGS: CT HEAD FINDINGS

Brain: Stable confluent hypodensities throughout the periventricular
and subcortical white matter consistent with chronic small vessel
ischemic change. No signs of acute infarct or hemorrhage. Lateral
ventricles and midline structures are unremarkable. No acute
extra-axial fluid collections. No mass effect.

Vascular: No hyperdense vessel or unexpected calcification.

Skull: Normal. Negative for fracture or focal lesion.

Sinuses/Orbits: No acute finding.

Other: None.

CT CERVICAL SPINE FINDINGS

Alignment: Alignment is anatomic.

Skull base and vertebrae: No acute displaced fracture.

Soft tissues and spinal canal: No prevertebral fluid or swelling. No
visible canal hematoma.

Disc levels: Mild multilevel cervical spondylosis greatest at C3-4,
C4-5, and C5-6. No significant compressive sequela.

Upper chest: Airway is patent. Extensive emphysematous changes and
scarring at the lung apices.

Other: Reconstructed images demonstrate no additional findings.
IMPRESSION: 1. Stable head CT, no acute process.
2. No acute cervical spine fracture.
